# Patient Record
Sex: Female | Born: 1980 | Race: Black or African American | Hispanic: No | Smoking: Current every day smoker
Health system: Southern US, Community
[De-identification: ages and names within clinical notes are randomized; demographics above are authoritative.]

## PROBLEM LIST (undated history)

## (undated) ENCOUNTER — Inpatient Hospital Stay (HOSPITAL_COMMUNITY): Payer: Self-pay

## (undated) DIAGNOSIS — N3090 Cystitis, unspecified without hematuria: Secondary | ICD-10-CM

## (undated) DIAGNOSIS — T4145XA Adverse effect of unspecified anesthetic, initial encounter: Secondary | ICD-10-CM

## (undated) DIAGNOSIS — M810 Age-related osteoporosis without current pathological fracture: Secondary | ICD-10-CM

## (undated) DIAGNOSIS — T8859XA Other complications of anesthesia, initial encounter: Secondary | ICD-10-CM

## (undated) DIAGNOSIS — G4730 Sleep apnea, unspecified: Secondary | ICD-10-CM

## (undated) DIAGNOSIS — F419 Anxiety disorder, unspecified: Secondary | ICD-10-CM

## (undated) DIAGNOSIS — K649 Unspecified hemorrhoids: Secondary | ICD-10-CM

## (undated) DIAGNOSIS — F410 Panic disorder [episodic paroxysmal anxiety] without agoraphobia: Secondary | ICD-10-CM

## (undated) DIAGNOSIS — A549 Gonococcal infection, unspecified: Secondary | ICD-10-CM

## (undated) DIAGNOSIS — F329 Major depressive disorder, single episode, unspecified: Secondary | ICD-10-CM

## (undated) DIAGNOSIS — M1990 Unspecified osteoarthritis, unspecified site: Secondary | ICD-10-CM

## (undated) HISTORY — DX: Unspecified osteoarthritis, unspecified site: M19.90

## (undated) HISTORY — DX: Unspecified hemorrhoids: K64.9

## (undated) HISTORY — PX: BREAST SURGERY: SHX581

## (undated) HISTORY — PX: BREAST MASS EXCISION: SHX1267

## (undated) HISTORY — PX: MOUTH SURGERY: SHX715

## (undated) HISTORY — DX: Panic disorder (episodic paroxysmal anxiety): F41.0

## (undated) HISTORY — DX: Sleep apnea, unspecified: G47.30

## (undated) HISTORY — DX: Age-related osteoporosis without current pathological fracture: M81.0

---
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
## 2000-01-03 ENCOUNTER — Inpatient Hospital Stay (HOSPITAL_COMMUNITY): Admission: AD | Admit: 2000-01-03 | Discharge: 2000-01-03 | Payer: Self-pay | Admitting: *Deleted

## 2000-02-03 ENCOUNTER — Inpatient Hospital Stay (HOSPITAL_COMMUNITY): Admission: AD | Admit: 2000-02-03 | Discharge: 2000-02-07 | Payer: Self-pay | Admitting: *Deleted

## 2000-02-04 ENCOUNTER — Encounter: Payer: Self-pay | Admitting: *Deleted

## 2000-03-20 ENCOUNTER — Inpatient Hospital Stay (HOSPITAL_COMMUNITY): Admission: RE | Admit: 2000-03-20 | Discharge: 2000-03-20 | Payer: Self-pay | Admitting: *Deleted

## 2000-07-01 ENCOUNTER — Inpatient Hospital Stay (HOSPITAL_COMMUNITY): Admission: AD | Admit: 2000-07-01 | Discharge: 2000-07-01 | Payer: Self-pay | Admitting: *Deleted

## 2002-07-05 ENCOUNTER — Emergency Department (HOSPITAL_COMMUNITY): Admission: EM | Admit: 2002-07-05 | Discharge: 2002-07-05 | Payer: Self-pay | Admitting: Emergency Medicine

## 2002-07-24 ENCOUNTER — Emergency Department (HOSPITAL_COMMUNITY): Admission: EM | Admit: 2002-07-24 | Discharge: 2002-07-24 | Payer: Self-pay | Admitting: Emergency Medicine

## 2002-12-02 ENCOUNTER — Emergency Department (HOSPITAL_COMMUNITY): Admission: EM | Admit: 2002-12-02 | Discharge: 2002-12-02 | Payer: Self-pay | Admitting: Emergency Medicine

## 2002-12-04 ENCOUNTER — Encounter: Payer: Self-pay | Admitting: Emergency Medicine

## 2002-12-04 ENCOUNTER — Emergency Department (HOSPITAL_COMMUNITY): Admission: EM | Admit: 2002-12-04 | Discharge: 2002-12-04 | Payer: Self-pay | Admitting: Emergency Medicine

## 2003-02-23 ENCOUNTER — Emergency Department (HOSPITAL_COMMUNITY): Admission: EM | Admit: 2003-02-23 | Discharge: 2003-02-23 | Payer: Self-pay | Admitting: Emergency Medicine

## 2003-02-23 ENCOUNTER — Encounter: Payer: Self-pay | Admitting: Emergency Medicine

## 2003-06-03 ENCOUNTER — Emergency Department (HOSPITAL_COMMUNITY): Admission: EM | Admit: 2003-06-03 | Discharge: 2003-06-03 | Payer: Self-pay | Admitting: Emergency Medicine

## 2003-09-03 ENCOUNTER — Encounter: Admission: RE | Admit: 2003-09-03 | Discharge: 2003-09-03 | Payer: Self-pay | Admitting: Obstetrics

## 2004-03-10 ENCOUNTER — Encounter (INDEPENDENT_AMBULATORY_CARE_PROVIDER_SITE_OTHER): Payer: Self-pay | Admitting: *Deleted

## 2004-03-10 ENCOUNTER — Ambulatory Visit (HOSPITAL_BASED_OUTPATIENT_CLINIC_OR_DEPARTMENT_OTHER): Admission: RE | Admit: 2004-03-10 | Discharge: 2004-03-10 | Payer: Self-pay | Admitting: General Surgery

## 2004-03-10 ENCOUNTER — Ambulatory Visit (HOSPITAL_COMMUNITY): Admission: RE | Admit: 2004-03-10 | Discharge: 2004-03-10 | Payer: Self-pay | Admitting: General Surgery

## 2004-06-06 ENCOUNTER — Encounter: Admission: RE | Admit: 2004-06-06 | Discharge: 2004-06-06 | Payer: Self-pay | Admitting: Obstetrics

## 2004-07-26 ENCOUNTER — Emergency Department (HOSPITAL_COMMUNITY): Admission: EM | Admit: 2004-07-26 | Discharge: 2004-07-26 | Payer: Self-pay | Admitting: Emergency Medicine

## 2004-09-13 ENCOUNTER — Emergency Department (HOSPITAL_COMMUNITY): Admission: EM | Admit: 2004-09-13 | Discharge: 2004-09-13 | Payer: Self-pay | Admitting: Emergency Medicine

## 2006-04-10 ENCOUNTER — Ambulatory Visit (HOSPITAL_COMMUNITY): Admission: RE | Admit: 2006-04-10 | Discharge: 2006-04-10 | Payer: Self-pay | Admitting: Obstetrics & Gynecology

## 2006-07-03 ENCOUNTER — Ambulatory Visit: Payer: Self-pay | Admitting: *Deleted

## 2006-07-03 ENCOUNTER — Inpatient Hospital Stay (HOSPITAL_COMMUNITY): Admission: AD | Admit: 2006-07-03 | Discharge: 2006-07-03 | Payer: Self-pay | Admitting: Gynecology

## 2006-07-16 ENCOUNTER — Ambulatory Visit (HOSPITAL_COMMUNITY): Admission: RE | Admit: 2006-07-16 | Discharge: 2006-07-16 | Payer: Self-pay | Admitting: Family Medicine

## 2006-08-26 ENCOUNTER — Inpatient Hospital Stay (HOSPITAL_COMMUNITY): Admission: AD | Admit: 2006-08-26 | Discharge: 2006-08-28 | Payer: Self-pay | Admitting: Gynecology

## 2006-08-26 ENCOUNTER — Ambulatory Visit: Payer: Self-pay | Admitting: Gynecology

## 2007-07-18 ENCOUNTER — Encounter (INDEPENDENT_AMBULATORY_CARE_PROVIDER_SITE_OTHER): Payer: Self-pay | Admitting: *Deleted

## 2009-07-18 ENCOUNTER — Emergency Department (HOSPITAL_COMMUNITY): Admission: EM | Admit: 2009-07-18 | Discharge: 2009-07-18 | Payer: Self-pay | Admitting: Emergency Medicine

## 2010-05-16 ENCOUNTER — Ambulatory Visit (HOSPITAL_BASED_OUTPATIENT_CLINIC_OR_DEPARTMENT_OTHER): Admission: RE | Admit: 2010-05-16 | Discharge: 2010-05-16 | Payer: Self-pay | Admitting: Internal Medicine

## 2010-05-20 ENCOUNTER — Ambulatory Visit: Payer: Self-pay | Admitting: Internal Medicine

## 2010-07-26 ENCOUNTER — Encounter: Admission: RE | Admit: 2010-07-26 | Discharge: 2010-07-26 | Payer: Self-pay | Admitting: Internal Medicine

## 2010-11-29 LAB — CBC
HCT: 36.4 % (ref 36.0–46.0)
Hemoglobin: 12.1 g/dL (ref 12.0–15.0)
Platelets: 237 10*3/uL (ref 150–400)
RBC: 4.48 MIL/uL (ref 3.87–5.11)
RDW: 13.8 % (ref 11.5–15.5)

## 2010-11-29 LAB — DIFFERENTIAL
Eosinophils Relative: 0 % (ref 0–5)
Lymphs Abs: 0.9 10*3/uL (ref 0.7–4.0)
Monocytes Absolute: 0.7 10*3/uL (ref 0.1–1.0)
Neutro Abs: 5.5 10*3/uL (ref 1.7–7.7)
Neutrophils Relative %: 78 % — ABNORMAL HIGH (ref 43–77)

## 2010-11-29 LAB — URINALYSIS, ROUTINE W REFLEX MICROSCOPIC
Bilirubin Urine: NEGATIVE
Ketones, ur: NEGATIVE mg/dL
Nitrite: NEGATIVE
Specific Gravity, Urine: 1.015 (ref 1.005–1.030)
pH: 7 (ref 5.0–8.0)

## 2010-11-29 LAB — BASIC METABOLIC PANEL
GFR calc Af Amer: >60 mL/min (ref >60)
Sodium: 136 mEq/L (ref 135–145)

## 2010-11-29 LAB — URINE CULTURE

## 2010-11-29 LAB — URINE MICROSCOPIC-ADD ON

## 2010-11-29 LAB — POCT PREGNANCY, URINE: Preg Test, Ur: NEGATIVE

## 2011-01-12 NOTE — Op Note (Signed)
Alicia [name], Alicia [name]                         ACCOUNT NO.:  [id number]   MEDICAL RECORD NO.:  [id number]                   PATIENT TYPE:  AMB   LOCATION:  DSC                                  FACILITY:  [facility]   PHYSICIAN:  Leonie Man, M.D.                DATE OF BIRTH:  [birth year]   DATE OF PROCEDURE:  03/10/2004  DATE OF DISCHARGE:                                 OPERATIVE REPORT   PREOPERATIVE DIAGNOSIS:  Mass right breast, probable fibroadenoma.   POSTOPERATIVE DIAGNOSIS:  Fibroadenoma, right breast.   PROCEDURE:  Excisional biopsy of right breast mass.   SURGEON:  Mardene Celeste. Lurene Shadow, M.D.   ASSISTANT:  None.   ANESTHESIA:  General.   NOTE:  The patient is a 30 year old female with an enlarging mass of the  left breast, which on mammogram, appears to be a fibroadenoma.  Because of  its increasing size, the patient desires to have this removed.  She comes to  the operating room now after the risks and potential benefits of surgery  have been discussed.  All questions answered and consent obtained.   PROCEDURE:  Following the induction of satisfactory general anesthesia with  the patient was positioned supine, the right breast was prepped and draped  to be included in the sterile operative field.  The mass, which is located  at the 9 o'clock axis approximately 12 to 15 cm from the nipple areolar  complex, is palpated and the incision is carried down through the skin and  subcutaneous tissues down to the capsule of the mass.  The mass was excised  in its entirety, removed, and forwarded for pathologic evaluation.  Hemostasis was obtained with electrocautery.  Sponge, needle, and instrument  counts were verified.  The breast tissues were reapproximated with 2-0  Vicryl suture, the subcutaneous tissues were closed with 3-0 Vicryl sutures,  and the skin was closed with running 5-0 Monocryl suture reinforced with  Steri-Strips.  Sterile dressings were applied.  The anesthetic  reversed and  the patient removed from the operating room to the recovery room in stable  condition, having tolerated the procedure well.                                               Leonie Man, M.D.    PB/MEDQ  D:  03/10/2004  T:  03/10/2004  Job:  [id number]

## 2011-01-12 NOTE — Discharge Summary (Signed)
[facility]  Patient:    Alicia [name], Alicia [name]                        MRN: [id number] Adm. Date:  [id number] Disc. Date: [id number] Attending:  Deniece Ree                           Discharge Summary  DISCHARGE DIAGNOSIS:          Intrauterine pregnancy at term with prolonged rupture of membranes, failure to progress, and a nonreassuring fetal heart pattern.  DISCHARGE OPERATION:          Primary low transverse cervical cesarean section.  SUMMARY:                      The patient is an 30 year old primigravida who was admitted with ruptured membranes and in early labor.  Patient was started on IV Pitocin, during which time she progressed to 2 cm and remained at a -2 station.  Due to prolonged rupture of membranes, decreased variability, and a nonreassuring fetal heart pattern, she was scheduled for a cesarean section. Patient underwent a primary cesarean section, from which she tolerated the procedure very well without any problems.  Postoperatively, this patient did very well and was discharged on postoperative day #3.  INSTRUCTIONS:                 She was instructed on the possible complications and care following this type of surgery.  FOLLOW-UP:                    She was told to return to my office in four weeks for follow-up evaluation, or to call me prior to that time should any problems arise. DD:  03/02/00 TD:  03/04/00 Job: [id number]

## 2011-01-12 NOTE — H&P (Signed)
[facility]  Patient:    Alicia [name], Alicia [name]                        MRN: [id number] Adm. Date:  [id number] Attending:  Deniece Ree                         History and Physical  HISTORY OF PRESENT ILLNESS:   The patient is an 30 year old primigravida whose estimated date of confinement is February 16, 2000.  The patient came to triage complaining of leaking of fluids.  On evaluation at the time of coming to the triage, the patient did indeed have rupture of membranes.  However, was not in active labor.  The patient was admitted to labor and delivery for routine labor and delivery.  PAST MEDICAL HISTORY: REVIEW OF SYSTEMS:            Noncontributory.  The patient has not had any prenatal complications.  PHYSICAL EXAMINATION:  GENERAL:                      Revealed a well-developed, well-nourished, gravid  female in labor-type distress.  HEENT:                        Within normal limits.  NECK:                         Supple.  BREASTS:                      Without masses, tenderness, or discharge.  LUNGS:                        Clear to auscultation and percussion.  HEART:                        Normal sinus rhythm without murmurs, rubs, or gallops.  ABDOMEN:                      Term gravid with fetal heart beats 136 to 148 in he right lower quadrant.  EXTREMITIES: NEUROLOGICAL:    Within normal limits.  PELVIC:                       Revealed a cervix that was 2 cm dilated, an apparent vertex presentation, at -2 station.  ADMISSION DIAGNOSES:          1. Intrauterine pregnancy at term.                               2. Rupture of membranes.  PLAN:                         Labor and delivery. DD:  02/04/00 TD:  02/06/00 Job: [id number]

## 2011-01-12 NOTE — Op Note (Signed)
[facility]  Patient:    Alicia [name], Alicia [name]                        MRN: [id number] Proc. Date: 02/04/00 Adm. Date:  [id number] Attending:  Deniece Ree                           Operative Report  INDICATIONS FOR PROCEDURE:    The patient was admitted with ruptured membranes.  However, during the next 17-18 hours and with Pitocin augmentation, the patient did not change the cervix at all.  She remained at 2 cm vertex presentation, now at a -2 station.  During a large portion of this time, the patient also had minimal variability with fetal heart rate pattern. After 17-18 hours, the decision was made to proceed with cesarean section with the diagnosis of prolonged ruptured membranes, failure to progress after adequate labor and a nonreassuring fetal heart pattern.  PREOPERATIVE DIAGNOSIS:       ______  OPERATION:                    Primary low transverse cervical cesarean section.  POSTOPERATIVE DIAGNOSES:      1. ______.                               2. Viable female infant with APGARS 9 and 9.  SURGEON:                      Deniece Ree, M.D.  ANESTHESIA:                   Epidural.  ANESTHESIOLOGIST:             Ellison Hughs., M.D.  ESTIMATED BLOOD LOSS:         500 cc.  DRAIN:                        Foley left to straight drainage.  DISPOSITION:                  The patient tolerated the procedure well and was returned to the recovery room in satisfactory condition.  DESCRIPTION OF PROCEDURE:     The patient was taken to the operating room, procedure in the usual fashion for a cesarean section.  A low Pfannenstiel incision was made.  This was carried down through the fascia, at which time the fascia was excised for the length of the incision.  The midline was identified and the rectus muscles separated.  The abdominal peritoneum was then incised in a vertical fashion using Metzenbaum scissors.  The visceral peritoneum was  then incised bilaterally toward the round ligaments, following which the lower uterine segment was scored, incised in the midline and bluntly dissected out.  A right hand was introduced and, at this point, the infants head was delivered.  It was noted at that time that there were a nuchal cord present.  The nasal and oropharynx were then suctioned out with a suction bulb followed by complete delivery without any problems.  The cord was clamped and the infant handed over to the pediatricians who were in attendance.  Because of the activity of the infant at this time, no cord pH was obtained.  Cord blood was then obtained, following which the  placenta as well as all products of conception were then manually removed from the uterine cavity.  IV Pitocin as well as IV antibiotics were then begun.  The myometrium was then closed using #1 chromic in a running locking stitch, followed by an imbricating stitch, again using #1 chromic.  Reperitonealization was then carried out using 2-0 chromic and a running stitch.  Sponge and needle count was correct x 2.  Hemostasis was present.  The tubes and ovaries bilaterally appeared to be within normal limits.  The abdominal peritoneum was then closed with 2-0 chromic in a running stitch, followed by closures of the fascia using #1 Vicryl in a running stitch.  The skin was closed with skin staples.  The procedure was terminated.  The patient tolerated the procedure well and was transferred to the recovery room in satisfactory condition. DD:  02/04/00 TD:  02/06/00 Job: [id number]

## 2011-03-06 ENCOUNTER — Emergency Department (HOSPITAL_COMMUNITY)
Admission: EM | Admit: 2011-03-06 | Discharge: 2011-03-06 | Disposition: A | Discharge status: Home / Self Care | Payer: Medicaid Other | Attending: Emergency Medicine | Admitting: Emergency Medicine

## 2011-03-06 DIAGNOSIS — R42 Dizziness and giddiness: Secondary | ICD-10-CM | POA: Insufficient documentation

## 2011-03-06 DIAGNOSIS — R55 Syncope and collapse: Secondary | ICD-10-CM | POA: Insufficient documentation

## 2011-03-06 DIAGNOSIS — E860 Dehydration: Secondary | ICD-10-CM | POA: Insufficient documentation

## 2011-03-06 DIAGNOSIS — R197 Diarrhea, unspecified: Secondary | ICD-10-CM | POA: Insufficient documentation

## 2011-03-06 LAB — DIFFERENTIAL
Basophils Absolute: 0 10*3/uL (ref 0.0–0.1)
Eosinophils Absolute: 0.2 10*3/uL (ref 0.0–0.7)
Eosinophils Relative: 2 % (ref 0–5)
Lymphs Abs: 4.2 10*3/uL — ABNORMAL HIGH (ref 0.7–4.0)
Monocytes Relative: 5 % (ref 3–12)
Neutro Abs: 5.3 10*3/uL (ref 1.7–7.7)

## 2011-03-06 LAB — CBC
MCV: 83.5 fL (ref 78.0–100.0)
RBC: 4.61 MIL/uL (ref 3.87–5.11)
RDW: 13.6 % (ref 11.5–15.5)
WBC: 10.3 10*3/uL (ref 4.0–10.5)

## 2011-03-06 LAB — COMPREHENSIVE METABOLIC PANEL
ALT: 18 U/L (ref 0–35)
Albumin: 3.5 g/dL (ref 3.5–5.2)
CO2: 27 mEq/L (ref 19–32)
Chloride: 101 mEq/L (ref 96–112)
Creatinine, Ser: 0.5 mg/dL (ref 0.50–1.10)
Total Bilirubin: 0.4 mg/dL (ref 0.3–1.2)
Total Protein: 7.9 g/dL (ref 6.0–8.3)

## 2011-03-08 ENCOUNTER — Emergency Department (HOSPITAL_COMMUNITY)
Admission: EM | Admit: 2011-03-08 | Discharge: 2011-03-08 | Disposition: A | Discharge status: Home / Self Care | Payer: Medicaid Other | Attending: Emergency Medicine | Admitting: Emergency Medicine

## 2011-03-08 DIAGNOSIS — E876 Hypokalemia: Secondary | ICD-10-CM | POA: Insufficient documentation

## 2011-03-08 DIAGNOSIS — R197 Diarrhea, unspecified: Secondary | ICD-10-CM | POA: Insufficient documentation

## 2011-03-08 DIAGNOSIS — R1013 Epigastric pain: Secondary | ICD-10-CM | POA: Insufficient documentation

## 2011-03-08 DIAGNOSIS — H81399 Other peripheral vertigo, unspecified ear: Secondary | ICD-10-CM | POA: Insufficient documentation

## 2011-03-08 DIAGNOSIS — R112 Nausea with vomiting, unspecified: Secondary | ICD-10-CM | POA: Insufficient documentation

## 2011-03-08 LAB — DIFFERENTIAL
Basophils Absolute: 0 10*3/uL (ref 0.0–0.1)
Eosinophils Relative: 1 % (ref 0–5)
Lymphocytes Relative: 35 % (ref 12–46)
Lymphs Abs: 3.9 10*3/uL (ref 0.7–4.0)
Monocytes Absolute: 0.5 10*3/uL (ref 0.1–1.0)
Neutro Abs: 6.5 10*3/uL (ref 1.7–7.7)

## 2011-03-08 LAB — URINALYSIS, ROUTINE W REFLEX MICROSCOPIC
Glucose, UA: NEGATIVE mg/dL
Hgb urine dipstick: NEGATIVE
Protein, ur: NEGATIVE mg/dL
Specific Gravity, Urine: 1.024 (ref 1.005–1.030)

## 2011-03-08 LAB — CBC
HCT: 34.1 % — ABNORMAL LOW (ref 36.0–46.0)
MCHC: 32.8 g/dL (ref 30.0–36.0)
MCV: 80.4 fL (ref 78.0–100.0)
Platelets: 296 10*3/uL (ref 150–400)
RDW: 13.5 % (ref 11.5–15.5)
WBC: 11 10*3/uL — ABNORMAL HIGH (ref 4.0–10.5)

## 2011-03-08 LAB — COMPREHENSIVE METABOLIC PANEL
ALT: 18 U/L (ref 0–35)
Alkaline Phosphatase: 69 U/L (ref 39–117)
Chloride: 103 mEq/L (ref 96–112)
GFR calc Af Amer: >60 mL/min (ref >60)
GFR calc non Af Amer: >60 mL/min (ref >60)
Total Bilirubin: 0.3 mg/dL (ref 0.3–1.2)
Total Protein: 7.3 g/dL (ref 6.0–8.3)

## 2011-03-08 LAB — URINE MICROSCOPIC-ADD ON

## 2011-03-08 LAB — LIPASE, BLOOD: Lipase: 16 U/L (ref 11–59)

## 2011-03-08 LAB — POCT PREGNANCY, URINE: Preg Test, Ur: NEGATIVE

## 2011-03-08 LAB — LACTIC ACID, PLASMA: Lactic Acid, Venous: 1.8 mmol/L (ref 0.5–2.2)

## 2012-06-22 ENCOUNTER — Inpatient Hospital Stay (HOSPITAL_COMMUNITY)
Admission: AD | Admit: 2012-06-22 | Discharge: 2012-06-22 | Disposition: A | Discharge status: Home / Self Care | Payer: Medicaid Other | Source: Ambulatory Visit | Attending: Obstetrics & Gynecology | Admitting: Obstetrics & Gynecology

## 2012-06-22 ENCOUNTER — Inpatient Hospital Stay (HOSPITAL_COMMUNITY): Payer: Medicaid Other

## 2012-06-22 ENCOUNTER — Encounter (HOSPITAL_COMMUNITY): Payer: Self-pay | Admitting: *Deleted

## 2012-06-22 DIAGNOSIS — O209 Hemorrhage in early pregnancy, unspecified: Secondary | ICD-10-CM | POA: Insufficient documentation

## 2012-06-22 HISTORY — DX: Gonococcal infection, unspecified: A54.9

## 2012-06-22 HISTORY — DX: Anxiety disorder, unspecified: F41.9

## 2012-06-22 HISTORY — DX: Other complications of anesthesia, initial encounter: T88.59XA

## 2012-06-22 HISTORY — DX: Adverse effect of unspecified anesthetic, initial encounter: T41.45XA

## 2012-06-22 HISTORY — DX: Cystitis, unspecified without hematuria: N30.90

## 2012-06-22 HISTORY — DX: Major depressive disorder, single episode, unspecified: F32.9

## 2012-06-22 LAB — CBC
HCT: 37.5 % (ref 36.0–46.0)
Hemoglobin: 11.5 g/dL — ABNORMAL LOW (ref 12.0–15.0)
MCH: 24.3 pg — ABNORMAL LOW (ref 26.0–34.0)
MCHC: 30.7 g/dL (ref 30.0–36.0)
MCV: 79.3 fL (ref 78.0–100.0)
Platelets: 324 10*3/uL (ref 150–400)
RBC: 4.73 MIL/uL (ref 3.87–5.11)
RDW: 14.5 % (ref 11.5–15.5)
WBC: 7.9 10*3/uL (ref 4.0–10.5)

## 2012-06-22 LAB — URINALYSIS, ROUTINE W REFLEX MICROSCOPIC
Ketones, ur: NEGATIVE mg/dL
Leukocytes, UA: NEGATIVE
Nitrite: NEGATIVE
Protein, ur: NEGATIVE mg/dL
Urobilinogen, UA: 0.2 mg/dL (ref 0.0–1.0)
pH: 6.5 (ref 5.0–8.0)

## 2012-06-22 LAB — URINE MICROSCOPIC-ADD ON

## 2012-06-22 LAB — HCG, QUANTITATIVE, PREGNANCY: hCG, Beta Chain, Quant, S: 3812 m[IU]/mL — ABNORMAL HIGH (ref <5)

## 2012-06-22 LAB — WET PREP, GENITAL
Trich, Wet Prep: NONE SEEN
Yeast Wet Prep HPF POC: NONE SEEN

## 2012-06-22 LAB — ABO/RH: ABO/RH(D): O POS

## 2012-06-22 NOTE — MAU Note (Signed)
Pt reports having some spotting on and off all weak but now bleeding a little heavier like the start of her period. Denies pain or cramping.

## 2012-06-22 NOTE — MAU Provider Note (Signed)
History     CSN: [id number]  Arrival date & time 06/22/12  1313   None     Chief Complaint  Patient presents with  . Vaginal Bleeding    (Consider location/radiation/quality/duration/timing/severity/associated sxs/prior treatment) HPI Alicia [name] is a 31 y.o. [id number] at [redacted]w[redacted]d. She presents with c/o increased bleeding today. She has had pink spotting off/on all week, today is darker and increased amount. Still small amt, no clots, pain or cramping. No recent change in discharge, no UTI S&S or GI changes.  Past Medical History  Diagnosis Date  . Anxiety   . Depression     No med. currently, Stopped with + UPT  . Bladder infection   . Gonorrhea   . Complication of anesthesia     Epidural did not work w/2nd preg. labor    Past Surgical History  Procedure Date  . Cesarean section     X 1 1st preg., then VBAC  . Breast mass excision     Benign    Family History  Problem Relation Age of Onset  . Other Neg Hx     History  Substance Use Topics  . Smoking status: Former Smoker    Quit date: 06/22/2005  . Smokeless tobacco: Never Used  . Alcohol Use: 2.4 oz/week    4 Glasses of wine per week     Every other month    OB History    Grav Para Term Preterm Abortions TAB SAB Ect Mult Living   3 2 2       2       Review of Systems  Constitutional: Positive for fatigue. Negative for fever and chills.  Gastrointestinal: Negative for abdominal pain, diarrhea and constipation.  Genitourinary: Positive for frequency and vaginal bleeding. Negative for dysuria, urgency and vaginal discharge.    Allergies  Review of patient's allergies indicates no known allergies.  Home Medications  No current outpatient prescriptions on file.  BP 117/47  Pulse 89  Temp 98.4 F (36.9 C) (Oral)  Resp 18  Ht 5\' 2"  (1.575 m)  Wt 266 lb 3.2 oz (120.748 kg)  BMI 48.69 kg/m2  LMP 05/05/2012  Physical Exam  Constitutional: She is oriented to person, place, and time. She  appears well-developed and well-nourished.  Abdominal: Soft. There is no tenderness.  Genitourinary:       Pelvic: Vulva- nl anatomy,skin intact Vagina- scant dark blood Cx- parous Uterus-? Enlarged, non tender Adn-non tender, no masses palp  Musculoskeletal: Normal range of motion.  Neurological: She is alert and oriented to person, place, and time.  Skin: Skin is warm and dry.  Psychiatric: She has a normal mood and affect. Her behavior is normal.    ED Course  Procedures (including critical care time)  Labs Reviewed  URINALYSIS, ROUTINE W REFLEX MICROSCOPIC - Abnormal; Notable for the following:    APPearance HAZY (*)     Hgb urine dipstick LARGE (*)     All other components within normal limits  POCT PREGNANCY, URINE - Abnormal; Notable for the following:    Preg Test, Ur POSITIVE (*)     All other components within normal limits  URINE MICROSCOPIC-ADD ON - Abnormal; Notable for the following:    Squamous Epithelial / LPF MANY (*)     Bacteria, UA FEW (*)     All other components within normal limits  ABO/RH  CBC  HCG, QUANTITATIVE, PREGNANCY  WET PREP, GENITAL  GC/CHLAMYDIA PROBE AMP, GENITAL   No results found.  Results for orders placed during the hospital encounter of 06/22/12 (from the past 24 hour(s))  URINALYSIS, ROUTINE W REFLEX MICROSCOPIC     Status: Abnormal   Collection Time   06/22/12  1:33 PM      Component Value Range   Color, Urine YELLOW  YELLOW   APPearance HAZY (*) CLEAR   Specific Gravity, Urine 1.025  1.005 - 1.030   pH 6.5  5.0 - 8.0   Glucose, UA NEGATIVE  NEGATIVE mg/dL   Hgb urine dipstick LARGE (*) NEGATIVE   Bilirubin Urine NEGATIVE  NEGATIVE   Ketones, ur NEGATIVE  NEGATIVE mg/dL   Protein, ur NEGATIVE  NEGATIVE mg/dL   Urobilinogen, UA 0.2  0.0 - 1.0 mg/dL   Nitrite NEGATIVE  NEGATIVE   Leukocytes, UA NEGATIVE  NEGATIVE  URINE MICROSCOPIC-ADD ON     Status: Abnormal   Collection Time   06/22/12  1:33 PM      Component Value  Range   Squamous Epithelial / LPF MANY (*) RARE   WBC, UA 0-2  <3 WBC/hpf   RBC / HPF 3-6  <3 RBC/hpf   Bacteria, UA FEW (*) RARE   Urine-Other MUCOUS PRESENT    POCT PREGNANCY, URINE     Status: Abnormal   Collection Time   06/22/12  1:45 PM      Component Value Range   Preg Test, Ur POSITIVE (*) NEGATIVE  WET PREP, GENITAL     Status: Abnormal   Collection Time   06/22/12  2:10 PM      Component Value Range   Yeast Wet Prep HPF POC NONE SEEN  NONE SEEN   Trich, Wet Prep NONE SEEN  NONE SEEN   Clue Cells Wet Prep HPF POC MODERATE (*) NONE SEEN   WBC, Wet Prep HPF POC FEW (*) NONE SEEN  ABO/RH     Status: Normal   Collection Time   06/22/12  2:40 PM      Component Value Range   ABO/RH(D) O POS    CBC     Status: Abnormal   Collection Time   06/22/12  2:40 PM      Component Value Range   WBC 7.9  4.0 - 10.5 K/uL   RBC 4.73  3.87 - 5.11 MIL/uL   Hemoglobin 11.5 (*) 12.0 - 15.0 g/dL   HCT 40.9  81.1 - 91.4 %   MCV 79.3  78.0 - 100.0 fL   MCH 24.3 (*) 26.0 - 34.0 pg   MCHC 30.7  30.0 - 36.0 g/dL   RDW 78.2  95.6 - 21.3 %   Platelets 324  150 - 400 K/uL  HCG, QUANTITATIVE, PREGNANCY     Status: Abnormal   Collection Time   06/22/12  2:40 PM      Component Value Range   hCG, Beta Chain, Quant, S 3812 (*) <5 mIU/mL   US Ob Comp Less 14 Wks  06/22/2012  *RADIOLOGY REPORT*  Clinical Data: 31 year old pregnant female with vaginal bleeding. Estimated gestation of 6 weeks 6 days by LMP.  OBSTETRIC <14 WK [place] AND TRANSVAGINAL OB US  Technique:  Both transabdominal and transvaginal ultrasound examinations were performed for complete evaluation of the gestation as well as the maternal uterus, adnexal regions, and pelvic cul-de-sac.  Transvaginal technique was performed to assess early pregnancy.  Comparison:  None  Intrauterine gestational sac:  There are two small cystic structures within the mid uterus, one which appears more simple and measures 6 x  8 x 9 mm.  The second cystic  structure is more complex and measures 6 x 7 x 9 mm.  I suspect the simple appearing cystic structure represents a small intrauterine gestational sac is there appears to be some decidual reaction.  The other mildly complex cystic structure could represent a small amount of blood/debris in the endometrial canal or possibly even a second gestational sac. Yolk sac: Not visualized Embryo: Not visualized  MSD: 7.4 mm  5 w 2 d    [place] EDC: 02/20/2013  Maternal uterus/adnexae: There is no evidence of subchorionic hemorrhage. The ovaries bilaterally are unremarkable. There is no evidence of free fluid or adnexal mass.  IMPRESSION: Two small cystic structures within the uterus as described.  One or both could represent an early intrauterine gestational sac, but no evidence of yolk sac or embryo. Clinical/laboratory and 1-2 week ultrasound follow-up is recommended.  No evidence of adnexal mass or free fluid.   Original Report Authenticated By: Rosendo Gros, M.D.    US Ob Transvaginal  06/22/2012  *RADIOLOGY REPORT*  Clinical Data: 31 year old pregnant female with vaginal bleeding. Estimated gestation of 6 weeks 6 days by LMP.  OBSTETRIC <14 WK [place] AND TRANSVAGINAL OB US  Technique:  Both transabdominal and transvaginal ultrasound examinations were performed for complete evaluation of the gestation as well as the maternal uterus, adnexal regions, and pelvic cul-de-sac.  Transvaginal technique was performed to assess early pregnancy.  Comparison:  None  Intrauterine gestational sac:  There are two small cystic structures within the mid uterus, one which appears more simple and measures 6 x 8 x 9 mm.  The second cystic structure is more complex and measures 6 x 7 x 9 mm.  I suspect the simple appearing cystic structure represents a small intrauterine gestational sac is there appears to be some decidual reaction.  The other mildly complex cystic structure could represent a small amount of blood/debris in the endometrial canal  or possibly even a second gestational sac. Yolk sac: Not visualized Embryo: Not visualized  MSD: 7.4 mm  5 w 2 d    [place] EDC: 02/20/2013  Maternal uterus/adnexae: There is no evidence of subchorionic hemorrhage. The ovaries bilaterally are unremarkable. There is no evidence of free fluid or adnexal mass.  IMPRESSION: Two small cystic structures within the uterus as described.  One or both could represent an early intrauterine gestational sac, but no evidence of yolk sac or embryo. Clinical/laboratory and 1-2 week ultrasound follow-up is recommended.  No evidence of adnexal mass or free fluid.   Original Report Authenticated By: Rosendo Gros, M.D.      No diagnosis found.    MDM  ASSESSMENT:  Very early pregnancy, ? twin gestation,? Viability O+ Clue cells on wet prep, no symptoms, pt declines to terat at this time   PLAN:  Return 48 hr repeat BHCG Precautions reviewed

## 2012-06-22 NOTE — MAU Provider Note (Signed)
Attestation of Attending Supervision of Advanced Practitioner (CNM/NP): Evaluation and management procedures were performed by the Advanced Practitioner under my supervision and collaboration.  I have reviewed the Advanced Practitioner's note and chart, and I agree with the management and plan.  Dajanae Brophy, MD, FACOG Attending Obstetrician & Gynecologist Faculty Practice, [facility]  

## 2012-06-23 LAB — GC/CHLAMYDIA PROBE AMP, GENITAL
Chlamydia, DNA Probe: NEGATIVE
GC Probe Amp, Genital: NEGATIVE

## 2012-06-27 ENCOUNTER — Other Ambulatory Visit: Payer: Self-pay | Admitting: Obstetrics

## 2012-06-27 ENCOUNTER — Ambulatory Visit (HOSPITAL_COMMUNITY): Payer: Medicaid Other

## 2012-06-27 DIAGNOSIS — O3680X0 Pregnancy with inconclusive fetal viability, not applicable or unspecified: Secondary | ICD-10-CM

## 2012-06-27 DIAGNOSIS — M1990 Unspecified osteoarthritis, unspecified site: Secondary | ICD-10-CM

## 2012-06-27 DIAGNOSIS — F32A Depression, unspecified: Secondary | ICD-10-CM

## 2012-06-27 DIAGNOSIS — K649 Unspecified hemorrhoids: Secondary | ICD-10-CM

## 2012-06-27 DIAGNOSIS — F410 Panic disorder [episodic paroxysmal anxiety] without agoraphobia: Secondary | ICD-10-CM

## 2012-06-27 HISTORY — DX: Unspecified osteoarthritis, unspecified site: M19.90

## 2012-06-27 HISTORY — DX: Unspecified hemorrhoids: K64.9

## 2012-06-27 HISTORY — DX: Depression, unspecified: F32.A

## 2012-06-27 HISTORY — DX: Panic disorder (episodic paroxysmal anxiety): F41.0

## 2012-06-27 LAB — SICKLE CELL SCREEN: Sickle Cell Screen: NEGATIVE

## 2012-07-01 ENCOUNTER — Ambulatory Visit (HOSPITAL_COMMUNITY): Payer: Medicaid Other

## 2012-07-02 ENCOUNTER — Ambulatory Visit (HOSPITAL_COMMUNITY)
Admission: RE | Admit: 2012-07-02 | Discharge: 2012-07-02 | Disposition: A | Discharge status: Home / Self Care | Payer: Medicaid Other | Source: Ambulatory Visit | Attending: Obstetrics | Admitting: Obstetrics

## 2012-07-02 DIAGNOSIS — O3680X0 Pregnancy with inconclusive fetal viability, not applicable or unspecified: Secondary | ICD-10-CM

## 2012-07-02 DIAGNOSIS — Z3689 Encounter for other specified antenatal screening: Secondary | ICD-10-CM | POA: Insufficient documentation

## 2012-08-28 ENCOUNTER — Encounter (HOSPITAL_COMMUNITY): Payer: Self-pay | Admitting: Emergency Medicine

## 2012-08-28 ENCOUNTER — Emergency Department (HOSPITAL_COMMUNITY)
Admission: EM | Admit: 2012-08-28 | Discharge: 2012-08-28 | Disposition: A | Discharge status: Home / Self Care | Payer: Medicaid Other | Attending: Emergency Medicine | Admitting: Emergency Medicine

## 2012-08-28 DIAGNOSIS — Z8659 Personal history of other mental and behavioral disorders: Secondary | ICD-10-CM | POA: Insufficient documentation

## 2012-08-28 DIAGNOSIS — F411 Generalized anxiety disorder: Secondary | ICD-10-CM | POA: Insufficient documentation

## 2012-08-28 DIAGNOSIS — Z79899 Other long term (current) drug therapy: Secondary | ICD-10-CM | POA: Insufficient documentation

## 2012-08-28 DIAGNOSIS — Z3490 Encounter for supervision of normal pregnancy, unspecified, unspecified trimester: Secondary | ICD-10-CM

## 2012-08-28 DIAGNOSIS — Z87891 Personal history of nicotine dependence: Secondary | ICD-10-CM | POA: Insufficient documentation

## 2012-08-28 DIAGNOSIS — Z87440 Personal history of urinary (tract) infections: Secondary | ICD-10-CM | POA: Insufficient documentation

## 2012-08-28 DIAGNOSIS — Z8619 Personal history of other infectious and parasitic diseases: Secondary | ICD-10-CM | POA: Insufficient documentation

## 2012-08-28 DIAGNOSIS — O9989 Other specified diseases and conditions complicating pregnancy, childbirth and the puerperium: Secondary | ICD-10-CM | POA: Insufficient documentation

## 2012-08-28 DIAGNOSIS — K529 Noninfective gastroenteritis and colitis, unspecified: Secondary | ICD-10-CM

## 2012-08-28 DIAGNOSIS — K5289 Other specified noninfective gastroenteritis and colitis: Secondary | ICD-10-CM | POA: Insufficient documentation

## 2012-08-28 LAB — POCT I-STAT, CHEM 8
Calcium, Ion: 1.15 mmol/L (ref 1.12–1.23)
Creatinine, Ser: 0.6 mg/dL (ref 0.50–1.10)
Glucose, Bld: 126 mg/dL — ABNORMAL HIGH (ref 70–99)
HCT: 46 % (ref 36.0–46.0)
Hemoglobin: 15.6 g/dL — ABNORMAL HIGH (ref 12.0–15.0)

## 2012-08-28 MED ORDER — ONDANSETRON 4 MG PO TBDP: 4.0000 mg | ORAL_TABLET | Freq: Three times a day (TID) | ORAL | Status: DC | PRN

## 2012-08-28 MED ORDER — SODIUM CHLORIDE 0.9 % IV BOLUS (SEPSIS)
1000.0000 mL | Freq: Once | INTRAVENOUS | Status: AC
  Administered 2012-08-28: 1000 mL via INTRAVENOUS

## 2012-08-28 MED ORDER — ONDANSETRON HCL 4 MG/2ML IJ SOLN
4.0000 mg | Freq: Once | INTRAMUSCULAR | Status: AC
  Administered 2012-08-28: 4 mg via INTRAVENOUS
  Filled 2012-08-28: qty 2

## 2012-08-28 NOTE — ED Notes (Signed)
Per patient, she is [redacted] wks pregnant.   Patient has been receiving prenatal care.   Patient woke up this morning with N/V/D.   Patient claims she didn't want to get dehydrated so called EMS.

## 2012-08-28 NOTE — ED Notes (Signed)
Provided ginger ale to the patient for fluid challenge.

## 2012-08-28 NOTE — ED Provider Notes (Signed)
History     CSN: [id number]  Arrival date & time 08/28/12  0820   First MD Initiated Contact with Patient 08/28/12 [id number]      Chief Complaint  Patient presents with  . Emesis  . Nausea  . Diarrhea     HPI Per patient, she is [redacted] wks pregnant. Patient has been receiving prenatal care. Patient woke up this morning with N/V/D. Patient claims she didn't want to get dehydrated so called EMS.  Patient denies hematochezia or hematemesis.  Denies fever.  Denies significant cough.  Past Medical History  Diagnosis Date  . Anxiety   . Depression     No med. currently, Stopped with + UPT  . Bladder infection   . Gonorrhea   . Complication of anesthesia     Epidural did not work w/2nd preg. labor    Past Surgical History  Procedure Date  . Cesarean section     X 1 1st preg., then VBAC  . Breast mass excision     Benign    Family History  Problem Relation Age of Onset  . Other Neg Hx     History  Substance Use Topics  . Smoking status: Former Smoker    Quit date: 06/22/2005  . Smokeless tobacco: Never Used  . Alcohol Use: No     Comment: Every other month    OB History    Grav Para Term Preterm Abortions TAB SAB Ect Mult Living   3 2 2       2       Review of Systems All other systems reviewed and are negative Allergies  Review of patient's allergies indicates no known allergies.  Home Medications   Current Outpatient Rx  Name  Route  Sig  Dispense  Refill  . CLONAZEPAM 1 MG PO TABS   Oral   Take 1 mg by mouth 2 (two) times daily as needed. anxiety         . ROBITUSSIN CF PO   Oral   Take 10 mLs by mouth every 4 (four) hours as needed. For cough         . ONDANSETRON 4 MG PO TBDP   Oral   Take 1 tablet (4 mg total) by mouth every 8 (eight) hours as needed for nausea.   20 tablet   0     BP 116/55  Pulse 96  Temp 98.8 F (37.1 C) (Oral)  Resp 20  Ht 5\' 2"  (1.575 m)  Wt 266 lb (120.657 kg)  BMI 48.65 kg/m2  SpO2 99%  LMP  05/05/2012  Physical Exam  Nursing note and vitals reviewed. Constitutional: She is oriented to person, place, and time. She appears well-developed and well-nourished. No distress.  HENT:  Head: Normocephalic and atraumatic.  Eyes: Pupils are equal, round, and reactive to light.  Neck: Normal range of motion.  Cardiovascular: Intact distal pulses.   Pulmonary/Chest: No respiratory distress. She has no wheezes.  Abdominal: Normal appearance. She exhibits no distension. There is no tenderness. There is no rebound.  Musculoskeletal: Normal range of motion.  Neurological: She is alert and oriented to person, place, and time. No cranial nerve deficit.  Skin: Skin is warm and dry. No rash noted.  Psychiatric: She has a normal mood and affect. Her behavior is normal.    ED Course  Procedures (including critical care time)  Medications  Pseudoephedrine-DM-GG (ROBITUSSIN CF PO) (not administered)  ondansetron (ZOFRAN ODT) 4 MG disintegrating tablet (not administered)  sodium  chloride 0.9 % bolus 1,000 mL (0 mL Intravenous Stopped 08/28/12 1232)  ondansetron (ZOFRAN) injection 4 mg (4 mg Intravenous Given 08/28/12 1016)    Labs Reviewed  POCT I-STAT, CHEM 8 - Abnormal; Notable for the following:    BUN 5 (*)     Glucose, Bld 126 (*)     Hemoglobin 15.6 (*)     All other components within normal limits  LAB REPORT - SCANNED   No results found.   1. Gastroenteritis   2. Intrauterine pregnancy       MDM  After treatment and PO challenge in the ED the patient feels back to baseline and wants to go home.         Nelia Shi, MD 08/31/12 1343

## 2012-10-30 ENCOUNTER — Other Ambulatory Visit: Payer: Self-pay | Admitting: Obstetrics

## 2012-10-30 DIAGNOSIS — Z369 Encounter for antenatal screening, unspecified: Secondary | ICD-10-CM

## 2012-11-04 ENCOUNTER — Encounter: Payer: Self-pay | Admitting: Obstetrics

## 2012-11-09 ENCOUNTER — Encounter: Payer: Self-pay | Admitting: *Deleted

## 2012-11-12 ENCOUNTER — Encounter: Payer: Self-pay | Admitting: Obstetrics

## 2012-11-12 ENCOUNTER — Ambulatory Visit (INDEPENDENT_AMBULATORY_CARE_PROVIDER_SITE_OTHER): Payer: Medicaid Other

## 2012-11-12 ENCOUNTER — Ambulatory Visit (INDEPENDENT_AMBULATORY_CARE_PROVIDER_SITE_OTHER): Payer: Medicaid Other | Admitting: Obstetrics

## 2012-11-12 ENCOUNTER — Ambulatory Visit (INDEPENDENT_AMBULATORY_CARE_PROVIDER_SITE_OTHER): Payer: Medicaid Other | Admitting: *Deleted

## 2012-11-12 VITALS — BP 105/69 | Temp 98.6°F | Wt 260.0 lb

## 2012-11-12 DIAGNOSIS — Z3480 Encounter for supervision of other normal pregnancy, unspecified trimester: Secondary | ICD-10-CM

## 2012-11-12 DIAGNOSIS — Z3482 Encounter for supervision of other normal pregnancy, second trimester: Secondary | ICD-10-CM

## 2012-11-12 DIAGNOSIS — Z369 Encounter for antenatal screening, unspecified: Secondary | ICD-10-CM

## 2012-11-12 LAB — POCT URINALYSIS DIPSTICK
Blood, UA: NEGATIVE
Ketones, UA: NEGATIVE
Spec Grav, UA: 1.02

## 2012-11-12 LAB — US OB DETAIL + 14 WK

## 2012-11-12 NOTE — Progress Notes (Signed)
Pt states she is having abdominal tightening twice a day for the 2 days.

## 2012-11-13 LAB — CBC
HCT: 36.6 % (ref 36.0–46.0)
Hemoglobin: 11.7 g/dL — ABNORMAL LOW (ref 12.0–15.0)
MCHC: 32 g/dL (ref 30.0–36.0)
RDW: 14.9 % (ref 11.5–15.5)
WBC: 9.7 10*3/uL (ref 4.0–10.5)

## 2012-11-13 LAB — GLUCOSE TOLERANCE, 2 HOURS W/ 1HR
Glucose, 1 hour: 195 mg/dL — ABNORMAL HIGH (ref 70–170)
Glucose, Fasting: 107 mg/dL — ABNORMAL HIGH (ref 70–99)

## 2012-11-13 LAB — RPR

## 2012-11-13 LAB — HIV ANTIBODY (ROUTINE TESTING W REFLEX): HIV: NONREACTIVE

## 2012-11-14 ENCOUNTER — Encounter: Payer: Self-pay | Admitting: Obstetrics

## 2012-11-19 ENCOUNTER — Encounter: Payer: Self-pay | Admitting: Obstetrics

## 2012-11-26 ENCOUNTER — Encounter: Payer: Medicaid Other | Admitting: Obstetrics

## 2013-01-22 ENCOUNTER — Encounter: Payer: Self-pay | Admitting: Obstetrics

## 2013-01-22 ENCOUNTER — Ambulatory Visit (INDEPENDENT_AMBULATORY_CARE_PROVIDER_SITE_OTHER): Payer: Medicaid Other | Admitting: Obstetrics

## 2013-01-22 VITALS — BP 117/80 | Temp 98.2°F | Wt 269.0 lb

## 2013-01-22 DIAGNOSIS — Z3483 Encounter for supervision of other normal pregnancy, third trimester: Secondary | ICD-10-CM

## 2013-01-22 DIAGNOSIS — Z3480 Encounter for supervision of other normal pregnancy, unspecified trimester: Secondary | ICD-10-CM

## 2013-01-22 DIAGNOSIS — O3660X0 Maternal care for excessive fetal growth, unspecified trimester, not applicable or unspecified: Secondary | ICD-10-CM

## 2013-01-22 DIAGNOSIS — O3663X1 Maternal care for excessive fetal growth, third trimester, fetus 1: Secondary | ICD-10-CM

## 2013-01-22 DIAGNOSIS — K219 Gastro-esophageal reflux disease without esophagitis: Secondary | ICD-10-CM | POA: Insufficient documentation

## 2013-01-22 LAB — POCT URINALYSIS DIPSTICK
Bilirubin, UA: NEGATIVE
Nitrite, UA: NEGATIVE

## 2013-01-22 MED ORDER — OMEPRAZOLE 20 MG PO CPDR: 20.0000 mg | DELAYED_RELEASE_CAPSULE | Freq: Every day | ORAL | Status: DC

## 2013-01-22 NOTE — Progress Notes (Signed)
Pulse- 93 Pt states she is having pressure in lower abdomen. Pt states she is having braxton hicks contractions.

## 2013-01-23 ENCOUNTER — Encounter: Payer: Self-pay | Admitting: Obstetrics

## 2013-01-24 LAB — STREP B DNA PROBE: GBSP: NEGATIVE

## 2013-01-27 ENCOUNTER — Ambulatory Visit (HOSPITAL_COMMUNITY): Payer: Medicaid Other

## 2013-01-27 ENCOUNTER — Encounter (HOSPITAL_COMMUNITY)
Admission: AD | Disposition: A | Discharge status: Home / Self Care | Payer: Self-pay | Source: Ambulatory Visit | Attending: Obstetrics & Gynecology

## 2013-01-27 ENCOUNTER — Encounter (HOSPITAL_COMMUNITY): Payer: Self-pay | Admitting: Anesthesiology

## 2013-01-27 ENCOUNTER — Inpatient Hospital Stay (HOSPITAL_COMMUNITY): Payer: Medicaid Other

## 2013-01-27 ENCOUNTER — Inpatient Hospital Stay (HOSPITAL_COMMUNITY): Payer: Medicaid Other | Admitting: Anesthesiology

## 2013-01-27 ENCOUNTER — Encounter: Payer: Medicaid Other | Admitting: Obstetrics

## 2013-01-27 ENCOUNTER — Encounter (HOSPITAL_COMMUNITY): Payer: Self-pay | Admitting: *Deleted

## 2013-01-27 ENCOUNTER — Inpatient Hospital Stay (HOSPITAL_COMMUNITY)
Admission: AD | Admit: 2013-01-27 | Discharge: 2013-01-30 | DRG: 766 | Disposition: A | Discharge status: Home / Self Care | Payer: Medicaid Other | Source: Ambulatory Visit | Attending: Obstetrics & Gynecology | Admitting: Obstetrics & Gynecology

## 2013-01-27 DIAGNOSIS — O34219 Maternal care for unspecified type scar from previous cesarean delivery: Secondary | ICD-10-CM | POA: Diagnosis present

## 2013-01-27 DIAGNOSIS — Z98891 History of uterine scar from previous surgery: Secondary | ICD-10-CM

## 2013-01-27 LAB — CBC
Hemoglobin: 11.7 g/dL — ABNORMAL LOW (ref 12.0–15.0)
MCH: 23.3 pg — ABNORMAL LOW (ref 26.0–34.0)
MCHC: 31.2 g/dL (ref 30.0–36.0)
MCV: 74.6 fL — ABNORMAL LOW (ref 78.0–100.0)
RBC: 5.03 MIL/uL (ref 3.87–5.11)
RDW: 16.5 % — ABNORMAL HIGH (ref 11.5–15.5)

## 2013-01-27 LAB — RPR: RPR Ser Ql: NONREACTIVE

## 2013-01-27 LAB — TYPE AND SCREEN
ABO/RH(D): O POS
Antibody Screen: NEGATIVE

## 2013-01-27 SURGERY — Surgical Case
Anesthesia: Epidural | Site: Abdomen | Wound class: Clean Contaminated

## 2013-01-27 MED ORDER — SCOPOLAMINE 1 MG/3DAYS TD PT72
MEDICATED_PATCH | TRANSDERMAL | Status: AC
  Administered 2013-01-27: 1.5 mg via TRANSDERMAL
  Filled 2013-01-27: qty 1

## 2013-01-27 MED ORDER — PHENYLEPHRINE HCL 10 MG/ML IJ SOLN
INTRAMUSCULAR | Status: DC | PRN
  Administered 2013-01-27: 40 ug via INTRAVENOUS
  Administered 2013-01-27: 80 ug via INTRAVENOUS
  Administered 2013-01-27 (×2): 40 ug via INTRAVENOUS
  Administered 2013-01-27 (×2): 80 ug via INTRAVENOUS
  Administered 2013-01-27 (×3): 40 ug via INTRAVENOUS

## 2013-01-27 MED ORDER — METOCLOPRAMIDE HCL 5 MG/ML IJ SOLN
INTRAMUSCULAR | Status: AC
  Filled 2013-01-27: qty 2

## 2013-01-27 MED ORDER — OXYTOCIN 10 UNIT/ML IJ SOLN
40.0000 [IU] | INTRAVENOUS | Status: DC | PRN
  Administered 2013-01-27: 40 [IU] via INTRAVENOUS

## 2013-01-27 MED ORDER — METOCLOPRAMIDE HCL 5 MG/ML IJ SOLN: 10.0000 mg | Freq: Three times a day (TID) | INTRAMUSCULAR | Status: DC | PRN

## 2013-01-27 MED ORDER — MORPHINE SULFATE (PF) 0.5 MG/ML IJ SOLN
INTRAMUSCULAR | Status: DC | PRN
  Administered 2013-01-27: 4 mg via EPIDURAL
  Administered 2013-01-27: 1 mg via INTRAVENOUS

## 2013-01-27 MED ORDER — PHENYLEPHRINE 40 MCG/ML (10ML) SYRINGE FOR IV PUSH (FOR BLOOD PRESSURE SUPPORT)
80.0000 ug | PREFILLED_SYRINGE | INTRAVENOUS | Status: DC | PRN
  Filled 2013-01-27: qty 2

## 2013-01-27 MED ORDER — ONDANSETRON HCL 4 MG PO TABS: 4.0000 mg | ORAL_TABLET | ORAL | Status: DC | PRN

## 2013-01-27 MED ORDER — MEPERIDINE HCL 25 MG/ML IJ SOLN: 6.2500 mg | INTRAMUSCULAR | Status: DC | PRN

## 2013-01-27 MED ORDER — ZOLPIDEM TARTRATE 5 MG PO TABS: 5.0000 mg | ORAL_TABLET | Freq: Every evening | ORAL | Status: DC | PRN

## 2013-01-27 MED ORDER — TERBUTALINE SULFATE 1 MG/ML IJ SOLN
INTRAMUSCULAR | Status: AC
  Administered 2013-01-27: 0.25 mg
  Filled 2013-01-27: qty 1

## 2013-01-27 MED ORDER — DIPHENHYDRAMINE HCL 50 MG/ML IJ SOLN: 12.5000 mg | INTRAMUSCULAR | Status: DC | PRN

## 2013-01-27 MED ORDER — LACTATED RINGERS IV SOLN
INTRAVENOUS | Status: DC | PRN
  Administered 2013-01-27 (×3): via INTRAVENOUS

## 2013-01-27 MED ORDER — MIDAZOLAM HCL 2 MG/2ML IJ SOLN: 0.5000 mg | Freq: Once | INTRAMUSCULAR | Status: DC | PRN

## 2013-01-27 MED ORDER — LACTATED RINGERS IV SOLN
INTRAVENOUS | Status: DC
  Administered 2013-01-27: 300 mL via INTRAUTERINE
  Administered 2013-01-27: 16:00:00 via INTRAUTERINE

## 2013-01-27 MED ORDER — DIBUCAINE 1 % RE OINT: 1.0000 "application " | TOPICAL_OINTMENT | RECTAL | Status: DC | PRN

## 2013-01-27 MED ORDER — SODIUM CHLORIDE 0.9 % IJ SOLN: 3.0000 mL | INTRAMUSCULAR | Status: DC | PRN

## 2013-01-27 MED ORDER — LIDOCAINE HCL (PF) 1 % IJ SOLN
INTRAMUSCULAR | Status: DC | PRN
  Administered 2013-01-27 (×2): 5 mL

## 2013-01-27 MED ORDER — OXYTOCIN 40 UNITS IN LACTATED RINGERS INFUSION - SIMPLE MED: 62.5000 mL/h | INTRAVENOUS | Status: DC

## 2013-01-27 MED ORDER — LACTATED RINGERS IV SOLN
INTRAVENOUS | Status: DC
  Administered 2013-01-28: 05:00:00 via INTRAVENOUS

## 2013-01-27 MED ORDER — LACTATED RINGERS IV SOLN
500.0000 mL | INTRAVENOUS | Status: DC | PRN
Start: 2013-01-27 — End: 2013-01-27
  Administered 2013-01-27 (×2): 1000 mL via INTRAVENOUS

## 2013-01-27 MED ORDER — LACTATED RINGERS IV SOLN: 500.0000 mL | Freq: Once | INTRAVENOUS | Status: DC

## 2013-01-27 MED ORDER — DIPHENHYDRAMINE HCL 25 MG PO CAPS
25.0000 mg | ORAL_CAPSULE | ORAL | Status: DC | PRN
  Administered 2013-01-27 – 2013-01-28 (×4): 25 mg via ORAL
  Filled 2013-01-27 (×3): qty 1

## 2013-01-27 MED ORDER — FENTANYL CITRATE 0.05 MG/ML IJ SOLN
INTRAMUSCULAR | Status: AC
  Administered 2013-01-27: 50 ug via INTRAVENOUS
  Filled 2013-01-27: qty 2

## 2013-01-27 MED ORDER — IBUPROFEN 600 MG PO TABS: 600.0000 mg | ORAL_TABLET | Freq: Four times a day (QID) | ORAL | Status: DC | PRN

## 2013-01-27 MED ORDER — LIDOCAINE-EPINEPHRINE (PF) 2 %-1:200000 IJ SOLN
INTRAMUSCULAR | Status: DC | PRN
  Administered 2013-01-27: 10 mL via EPIDURAL

## 2013-01-27 MED ORDER — PRENATAL MULTIVITAMIN CH
1.0000 | ORAL_TABLET | Freq: Every day | ORAL | Status: DC
  Administered 2013-01-28 – 2013-01-30 (×3): 1 via ORAL
  Filled 2013-01-27 (×3): qty 1

## 2013-01-27 MED ORDER — MORPHINE SULFATE 0.5 MG/ML IJ SOLN
INTRAMUSCULAR | Status: AC
  Filled 2013-01-27: qty 10

## 2013-01-27 MED ORDER — TETANUS-DIPHTH-ACELL PERTUSSIS 5-2.5-18.5 LF-MCG/0.5 IM SUSP
0.5000 mL | Freq: Once | INTRAMUSCULAR | Status: AC
  Administered 2013-01-28: 0.5 mL via INTRAMUSCULAR
  Filled 2013-01-27: qty 0.5

## 2013-01-27 MED ORDER — WITCH HAZEL-GLYCERIN EX PADS: 1.0000 "application " | MEDICATED_PAD | CUTANEOUS | Status: DC | PRN

## 2013-01-27 MED ORDER — OXYTOCIN BOLUS FROM INFUSION: 500.0000 mL | INTRAVENOUS | Status: DC

## 2013-01-27 MED ORDER — IBUPROFEN 600 MG PO TABS
600.0000 mg | ORAL_TABLET | Freq: Four times a day (QID) | ORAL | Status: DC
  Administered 2013-01-28 – 2013-01-30 (×9): 600 mg via ORAL
  Filled 2013-01-27 (×9): qty 1

## 2013-01-27 MED ORDER — ONDANSETRON HCL 4 MG/2ML IJ SOLN
INTRAMUSCULAR | Status: AC
  Filled 2013-01-27: qty 2

## 2013-01-27 MED ORDER — ONDANSETRON HCL 4 MG/2ML IJ SOLN: 4.0000 mg | Freq: Three times a day (TID) | INTRAMUSCULAR | Status: DC | PRN

## 2013-01-27 MED ORDER — DIPHENHYDRAMINE HCL 50 MG/ML IJ SOLN: 25.0000 mg | INTRAMUSCULAR | Status: DC | PRN

## 2013-01-27 MED ORDER — DEXTROSE 5 % IV SOLN
3.0000 g | INTRAVENOUS | Status: DC | PRN
  Administered 2013-01-27: 3 g via INTRAVENOUS

## 2013-01-27 MED ORDER — KETOROLAC TROMETHAMINE 30 MG/ML IJ SOLN: 30.0000 mg | Freq: Four times a day (QID) | INTRAMUSCULAR | Status: AC | PRN

## 2013-01-27 MED ORDER — LACTATED RINGERS IV SOLN
INTRAVENOUS | Status: DC | PRN
  Administered 2013-01-27: 17:00:00 via INTRAVENOUS

## 2013-01-27 MED ORDER — SODIUM BICARBONATE 8.4 % IV SOLN
INTRAVENOUS | Status: AC
  Filled 2013-01-27: qty 50

## 2013-01-27 MED ORDER — SENNOSIDES-DOCUSATE SODIUM 8.6-50 MG PO TABS
2.0000 | ORAL_TABLET | Freq: Every day | ORAL | Status: DC
  Administered 2013-01-28 – 2013-01-29 (×2): 2 via ORAL

## 2013-01-27 MED ORDER — OXYCODONE-ACETAMINOPHEN 5-325 MG PO TABS: 1.0000 | ORAL_TABLET | ORAL | Status: DC | PRN

## 2013-01-27 MED ORDER — LACTATED RINGERS IV SOLN
INTRAVENOUS | Status: DC
  Administered 2013-01-27 (×2): via INTRAVENOUS

## 2013-01-27 MED ORDER — FENTANYL 2.5 MCG/ML BUPIVACAINE 1/10 % EPIDURAL INFUSION (WH - ANES)
14.0000 mL/h | INTRAMUSCULAR | Status: DC | PRN
  Administered 2013-01-27: 14 mL/h via EPIDURAL
  Filled 2013-01-27: qty 125

## 2013-01-27 MED ORDER — EPHEDRINE 5 MG/ML INJ
10.0000 mg | INTRAVENOUS | Status: DC | PRN
  Administered 2013-01-27: 10 mg via INTRAVENOUS
  Filled 2013-01-27: qty 2

## 2013-01-27 MED ORDER — PHENYLEPHRINE 40 MCG/ML (10ML) SYRINGE FOR IV PUSH (FOR BLOOD PRESSURE SUPPORT)
80.0000 ug | PREFILLED_SYRINGE | INTRAVENOUS | Status: DC | PRN
  Filled 2013-01-27: qty 5
  Filled 2013-01-27: qty 2

## 2013-01-27 MED ORDER — NALBUPHINE HCL 10 MG/ML IJ SOLN
5.0000 mg | INTRAMUSCULAR | Status: DC | PRN
  Filled 2013-01-27: qty 1

## 2013-01-27 MED ORDER — DEXTROSE 5 % IV SOLN
2.0000 g | Freq: Four times a day (QID) | INTRAVENOUS | Status: AC
  Administered 2013-01-27 – 2013-01-28 (×3): 2 g via INTRAVENOUS
  Filled 2013-01-27 (×4): qty 2

## 2013-01-27 MED ORDER — ACETAMINOPHEN 10 MG/ML IV SOLN
1000.0000 mg | Freq: Four times a day (QID) | INTRAVENOUS | Status: AC | PRN
  Filled 2013-01-27: qty 100

## 2013-01-27 MED ORDER — FENTANYL CITRATE 0.05 MG/ML IJ SOLN
25.0000 ug | INTRAMUSCULAR | Status: DC | PRN
  Administered 2013-01-27: 50 ug via INTRAVENOUS

## 2013-01-27 MED ORDER — CITRIC ACID-SODIUM CITRATE 334-500 MG/5ML PO SOLN
30.0000 mL | ORAL | Status: DC | PRN
  Administered 2013-01-27: 30 mL via ORAL
  Filled 2013-01-27: qty 15

## 2013-01-27 MED ORDER — SIMETHICONE 80 MG PO CHEW
80.0000 mg | CHEWABLE_TABLET | ORAL | Status: DC | PRN
  Administered 2013-01-28: 80 mg via ORAL

## 2013-01-27 MED ORDER — EPHEDRINE 5 MG/ML INJ
10.0000 mg | INTRAVENOUS | Status: DC | PRN
  Filled 2013-01-27: qty 4
  Filled 2013-01-27: qty 2

## 2013-01-27 MED ORDER — OXYTOCIN 10 UNIT/ML IJ SOLN
INTRAMUSCULAR | Status: AC
  Filled 2013-01-27: qty 4

## 2013-01-27 MED ORDER — LANOLIN HYDROUS EX OINT: 1.0000 "application " | TOPICAL_OINTMENT | CUTANEOUS | Status: DC | PRN

## 2013-01-27 MED ORDER — OXYCODONE-ACETAMINOPHEN 5-325 MG PO TABS
1.0000 | ORAL_TABLET | ORAL | Status: DC | PRN
  Administered 2013-01-28: 2 via ORAL
  Administered 2013-01-28: 1 via ORAL
  Administered 2013-01-28 – 2013-01-29 (×6): 2 via ORAL
  Administered 2013-01-30 (×2): 1 via ORAL
  Administered 2013-01-30: 2 via ORAL
  Administered 2013-01-30: 1 via ORAL
  Filled 2013-01-27 (×5): qty 2
  Filled 2013-01-27: qty 1
  Filled 2013-01-27 (×2): qty 2
  Filled 2013-01-27: qty 1
  Filled 2013-01-27 (×2): qty 2
  Filled 2013-01-27: qty 1

## 2013-01-27 MED ORDER — NALOXONE HCL 1 MG/ML IJ SOLN
1.0000 ug/kg/h | INTRAMUSCULAR | Status: DC | PRN
  Filled 2013-01-27: qty 2

## 2013-01-27 MED ORDER — SCOPOLAMINE 1 MG/3DAYS TD PT72: 1.0000 | MEDICATED_PATCH | Freq: Once | TRANSDERMAL | Status: AC

## 2013-01-27 MED ORDER — MENTHOL 3 MG MT LOZG: 1.0000 | LOZENGE | OROMUCOSAL | Status: DC | PRN

## 2013-01-27 MED ORDER — ONDANSETRON HCL 4 MG/2ML IJ SOLN
INTRAMUSCULAR | Status: DC | PRN
  Administered 2013-01-27: 4 mg via INTRAVENOUS

## 2013-01-27 MED ORDER — PROMETHAZINE HCL 25 MG/ML IJ SOLN: 6.2500 mg | INTRAMUSCULAR | Status: DC | PRN

## 2013-01-27 MED ORDER — EPHEDRINE 5 MG/ML INJ
INTRAVENOUS | Status: AC
  Filled 2013-01-27: qty 10

## 2013-01-27 MED ORDER — PHENYLEPHRINE 40 MCG/ML (10ML) SYRINGE FOR IV PUSH (FOR BLOOD PRESSURE SUPPORT)
PREFILLED_SYRINGE | INTRAVENOUS | Status: AC
  Filled 2013-01-27: qty 15

## 2013-01-27 MED ORDER — ONDANSETRON HCL 4 MG/2ML IJ SOLN: 4.0000 mg | Freq: Four times a day (QID) | INTRAMUSCULAR | Status: DC | PRN

## 2013-01-27 MED ORDER — SIMETHICONE 80 MG PO CHEW
80.0000 mg | CHEWABLE_TABLET | Freq: Three times a day (TID) | ORAL | Status: DC
  Administered 2013-01-28 – 2013-01-30 (×7): 80 mg via ORAL

## 2013-01-27 MED ORDER — DEXTROSE 5 % IV SOLN
INTRAVENOUS | Status: AC
  Filled 2013-01-27: qty 3000

## 2013-01-27 MED ORDER — PHENYLEPHRINE 40 MCG/ML (10ML) SYRINGE FOR IV PUSH (FOR BLOOD PRESSURE SUPPORT)
PREFILLED_SYRINGE | INTRAVENOUS | Status: AC
  Filled 2013-01-27: qty 5

## 2013-01-27 MED ORDER — KETOROLAC TROMETHAMINE 30 MG/ML IJ SOLN
30.0000 mg | Freq: Four times a day (QID) | INTRAMUSCULAR | Status: AC | PRN
  Administered 2013-01-28: 30 mg via INTRAVENOUS
  Filled 2013-01-27: qty 1

## 2013-01-27 MED ORDER — ACETAMINOPHEN 325 MG PO TABS: 650.0000 mg | ORAL_TABLET | ORAL | Status: DC | PRN

## 2013-01-27 MED ORDER — KETOROLAC TROMETHAMINE 30 MG/ML IJ SOLN
INTRAMUSCULAR | Status: AC
  Administered 2013-01-27: 30 mg via INTRAVENOUS
  Filled 2013-01-27: qty 1

## 2013-01-27 MED ORDER — DIPHENHYDRAMINE HCL 25 MG PO CAPS
25.0000 mg | ORAL_CAPSULE | Freq: Four times a day (QID) | ORAL | Status: DC | PRN
  Filled 2013-01-27: qty 1

## 2013-01-27 MED ORDER — NALOXONE HCL 0.4 MG/ML IJ SOLN: 0.4000 mg | INTRAMUSCULAR | Status: DC | PRN

## 2013-01-27 MED ORDER — LIDOCAINE HCL (PF) 1 % IJ SOLN
30.0000 mL | INTRAMUSCULAR | Status: DC | PRN
  Filled 2013-01-27: qty 30

## 2013-01-27 MED ORDER — ONDANSETRON HCL 4 MG/2ML IJ SOLN: 4.0000 mg | INTRAMUSCULAR | Status: DC | PRN

## 2013-01-27 MED ORDER — OXYTOCIN 40 UNITS IN LACTATED RINGERS INFUSION - SIMPLE MED
62.5000 mL/h | INTRAVENOUS | Status: AC
  Administered 2013-01-27: 62.5 mL/h via INTRAVENOUS

## 2013-01-27 MED ORDER — LIDOCAINE-EPINEPHRINE (PF) 2 %-1:200000 IJ SOLN
INTRAMUSCULAR | Status: AC
  Filled 2013-01-27: qty 20

## 2013-01-27 MED ORDER — NALBUPHINE HCL 10 MG/ML IJ SOLN
5.0000 mg | INTRAMUSCULAR | Status: DC | PRN
  Administered 2013-01-27 – 2013-01-28 (×3): 10 mg via SUBCUTANEOUS
  Filled 2013-01-27 (×3): qty 1

## 2013-01-27 SURGICAL SUPPLY — 44 items
ADH SKN CLS APL DERMABOND .7 (GAUZE/BANDAGES/DRESSINGS) ×1
CANISTER WOUND CARE 500ML ATS (WOUND CARE) IMPLANT
CLAMP CORD UMBIL (MISCELLANEOUS) IMPLANT
CLOTH BEACON ORANGE TIMEOUT ST (SAFETY) ×2 IMPLANT
CONTAINER PREFILL 10% NBF 15ML (MISCELLANEOUS) ×4 IMPLANT
DERMABOND ADVANCED (GAUZE/BANDAGES/DRESSINGS) ×1
DERMABOND ADVANCED .7 DNX12 (GAUZE/BANDAGES/DRESSINGS) ×1 IMPLANT
DRAPE LG THREE QUARTER DISP (DRAPES) ×2 IMPLANT
DRSG OPSITE 6X11 MED (GAUZE/BANDAGES/DRESSINGS) ×1 IMPLANT
DRSG OPSITE POSTOP 4X10 (GAUZE/BANDAGES/DRESSINGS) ×2 IMPLANT
DRSG VAC ATS LRG SENSATRAC (GAUZE/BANDAGES/DRESSINGS) IMPLANT
DRSG VAC ATS MED SENSATRAC (GAUZE/BANDAGES/DRESSINGS) IMPLANT
DRSG VAC ATS SM SENSATRAC (GAUZE/BANDAGES/DRESSINGS) IMPLANT
DURAPREP 26ML APPLICATOR (WOUND CARE) ×2 IMPLANT
ELECT REM PT RETURN 9FT ADLT (ELECTROSURGICAL) ×2
ELECTRODE REM PT RTRN 9FT ADLT (ELECTROSURGICAL) ×1 IMPLANT
EXTRACTOR VACUUM M CUP 4 TUBE (SUCTIONS) IMPLANT
GLOVE BIO SURGEON STRL SZ8 (GLOVE) ×4 IMPLANT
GOWN PREVENTION PLUS XLARGE (GOWN DISPOSABLE) ×2 IMPLANT
GOWN STRL REIN XL XLG (GOWN DISPOSABLE) ×4 IMPLANT
KIT ABG SYR 3ML LUER SLIP (SYRINGE) IMPLANT
NDL HYPO 25X5/8 SAFETYGLIDE (NEEDLE) ×1 IMPLANT
NEEDLE HYPO 25X5/8 SAFETYGLIDE (NEEDLE) ×2 IMPLANT
NS IRRIG 1000ML POUR BTL (IV SOLUTION) ×2 IMPLANT
PACK C SECTION WH (CUSTOM PROCEDURE TRAY) ×2 IMPLANT
PAD OB MATERNITY 4.3X12.25 (PERSONAL CARE ITEMS) ×2 IMPLANT
RTRCTR C-SECT PINK 25CM LRG (MISCELLANEOUS) ×2 IMPLANT
STAPLER VISISTAT 35W (STAPLE) IMPLANT
SUT GUT PLAIN 0 CT-3 TAN 27 (SUTURE) IMPLANT
SUT MNCRL 0 VIOLET CTX 36 (SUTURE) ×3 IMPLANT
SUT MNCRL AB 4-0 PS2 18 (SUTURE) IMPLANT
SUT MON AB 2-0 CT1 27 (SUTURE) ×2 IMPLANT
SUT MON AB 3-0 SH 27 (SUTURE)
SUT MON AB 3-0 SH27 (SUTURE) IMPLANT
SUT MONOCRYL 0 CTX 36 (SUTURE) ×3
SUT PDS AB 0 CTX 60 (SUTURE) ×1 IMPLANT
SUT PLAIN 2 0 XLH (SUTURE) IMPLANT
SUT VIC AB 0 CTX 36 (SUTURE) ×4
SUT VIC AB 0 CTX36XBRD ANBCTRL (SUTURE) IMPLANT
SUT VIC AB 2-0 CT1 27 (SUTURE)
SUT VIC AB 2-0 CT1 TAPERPNT 27 (SUTURE) IMPLANT
TOWEL OR 17X24 6PK STRL BLUE (TOWEL DISPOSABLE) ×6 IMPLANT
TRAY FOLEY CATH 14FR (SET/KITS/TRAYS/PACK) ×2 IMPLANT
WATER STERILE IRR 1000ML POUR (IV SOLUTION) ×1 IMPLANT

## 2013-01-27 NOTE — Anesthesia Postprocedure Evaluation (Signed)
  Anesthesia Post Note  Patient: Alicia [name]  Procedure(s) Performed: Procedure(s) (LRB): CESAREAN SECTION (N/A)  Anesthesia type: epidural  Patient location: PACU  Post pain: Pain level controlled  Post assessment: Post-op Vital signs reviewed  Last Vitals:  Filed Vitals:   01/27/13 1815  BP: 136/54  Pulse: 115  Temp:   Resp: 21    Post vital signs: Reviewed  Level of consciousness: awake  Complications: No apparent anesthesia complications

## 2013-01-27 NOTE — MAU Note (Signed)
Pt states at 0650 had gush of clear/blood tinged fluid. Contractions were q5 minutes apart, now are coming more frequently. Unsure of dilation

## 2013-01-27 NOTE — MAU Note (Signed)
C/o ucs; c/o SROM @ [id number];

## 2013-01-27 NOTE — Progress Notes (Signed)
Dr Clearance Coots viewing FHT's strip from office.  States no concerns regarding strip at this time.  Continue current plan of care.

## 2013-01-27 NOTE — Anesthesia Procedure Notes (Signed)

## 2013-01-27 NOTE — Transfer of Care (Signed)
Immediate Anesthesia Transfer of Care Note  Patient: Alicia [name]  Procedure(s) Performed: Procedure(s): CESAREAN SECTION (N/A)  Patient Location: PACU  Anesthesia Type:Epidural  Level of Consciousness: awake, alert  and oriented  Airway & Oxygen Therapy: Patient Spontanous Breathing  Post-op Assessment: Report given to PACU RN and Post -op Vital signs reviewed and stable  Post vital signs: Reviewed and stable  Complications: No apparent anesthesia complications

## 2013-01-27 NOTE — H&P (Signed)
Alicia [name] is a 32 y.o. female presenting for SROM. Maternal Medical History:  Reason for admission: Rupture of membranes and contractions.  31 y o G3 P2.  EDC 02-20-13.  Presents with c/o leaking fluid per vagina and UC's.  Contractions: Onset was 3-5 hours ago.   Frequency: irregular.    Fetal activity: Perceived fetal activity is normal.   Last perceived fetal movement was within the past hour.    Prenatal complications: no prenatal complications Prenatal Complications - Diabetes: none.    OB History   Grav Para Term Preterm Abortions TAB SAB Ect Mult Living   3 2 2       2      Past Medical History  Diagnosis Date  . Anxiety   . Depression     No med. currently, Stopped with + UPT  . Bladder infection   . Gonorrhea   . Complication of anesthesia     Epidural did not work w/2nd preg. labor  . Osteoarthritis 06/27/2012  . Hemorrhoids 06/27/2012  . Depression 06/27/2012  . Panic disorder 06/27/2012   Past Surgical History  Procedure Laterality Date  . Cesarean section      X 1 1st preg., then VBAC  . Breast mass excision      Benign  . Mouth surgery     Family History: family history is negative for Other, and Alcohol abuse, and Arthritis, and Asthma, and Birth defects, and COPD, and Cancer, and Depression, and Drug abuse, and Diabetes, and Early death, and Hearing loss, and Heart disease, and Hyperlipidemia, and Hypertension, and Kidney disease, and Learning disabilities, and Mental illness, and Mental retardation, and Miscarriages / Stillbirths, and Stroke, and Vision loss, . Social History:  reports that she quit smoking about 7 years ago. She has never used smokeless tobacco. She reports that she does not drink alcohol or use illicit drugs.   Prenatal Transfer Tool  Maternal Diabetes: No Genetic Screening: Normal Maternal Ultrasounds/Referrals: Normal Fetal Ultrasounds or other Referrals:  None Maternal Substance Abuse:  No Significant Maternal Medications:   None Significant Maternal Lab Results:  None Other Comments:  None  Review of Systems  All other systems reviewed and are negative.    Dilation: 2 Effacement (%): 50 Station: -3 Exam by:: Morrison Old RN Blood pressure 126/50, pulse 74, temperature 98.2 F (36.8 C), temperature source Oral, resp. rate 20, height 5\' 2"  (1.575 m), weight 269 lb (122.018 kg), last menstrual period 05/05/2012. Maternal Exam:  Uterine Assessment: Contraction strength is mild.  Contraction frequency is irregular.   Abdomen: Patient reports no abdominal tenderness. Fetal presentation: vertex  Introitus: Normal vulva. Normal vagina.    Physical Exam  Nursing note and vitals reviewed. Constitutional: She is oriented to person, place, and time. She appears well-developed and well-nourished.  HENT:  Head: Normocephalic and atraumatic.  Eyes: Conjunctivae are normal. Pupils are equal, round, and reactive to light.  Neck: Normal range of motion. Neck supple.  Cardiovascular: Normal rate and regular rhythm.   Respiratory: Effort normal and breath sounds normal.  GI: Soft.  Genitourinary: Vagina normal and uterus normal.  Musculoskeletal: Normal range of motion.  Neurological: She is alert and oriented to person, place, and time.  Skin: Skin is warm and dry.  Psychiatric: She has a normal mood and affect. Her behavior is normal. Judgment and thought content normal.    Prenatal labs: ABO, Rh: --/--/O POS (10/27 1440) Antibody: Negative (11/01 0000) Rubella: Immune (11/01 0000) RPR: NON REAC (03/19 1012)  HBsAg: Negative (11/01 0000)  HIV: NON REACTIVE (03/19 1012)  GBS: NEGATIVE (05/29 1547)   Assessment/Plan: 36 weeks.  SROM.  VBAC.  Pitocin prn.   Daeron Carreno A 01/27/2013, 11:34 AM

## 2013-01-27 NOTE — Op Note (Signed)
Cesarean Section Procedure Note   Alicia [name]   01/27/2013  Indications: Persistent FHR decelerations.  Severe variable decels down to 60 bpm, lasting 2-3 minutes.  Pre-operative Diagnosis: Fetal Intolerance to Labor.  Severe persistent variable FHR decels.  Persistent OP position.  Post-operative Diagnosis: Same .  Multiple loops of occult cord adjacent to vertex.  Surgeon: Brock Bad  Assistants: Francoise Ceo  Anesthesia: epidural  Procedure Details:  The patient was seen in the Holding Room. The risks, benefits, complications, treatment options, and expected outcomes were discussed with the patient. The patient concurred with the proposed plan, giving informed consent. The patient was identified as [name] and the procedure verified as C-Section Delivery. A Time Out was held and the above information confirmed.  After induction of anesthesia, the patient was draped and prepped in the usual sterile manner. A transverse incision was made and carried down through the subcutaneous tissue to the fascia. The fascial incision was made and extended transversely. The fascia was separated from the underlying rectus tissue superiorly and inferiorly. The peritoneum was identified and entered. The peritoneal incision was extended longitudinally. The utero-vesical peritoneal reflection was incised transversely and the bladder flap was bluntly freed from the lower uterine segment. A low transverse uterine incision was made. Delivered from cephalic presentation was a 3255 gram living newborn female infant(s). APGAR (1 MIN): 4   APGAR (5 MINS): 8    APGAR (10 MINS):    A cord ph was sent.  Cord pH was 7.15. The umbilical cord was clamped and cut cord. A sample was obtained for evaluation. The placenta was removed Intact and appeared normal.  The uterine incision was closed with running locked sutures of 1-0 Monocryl. A second imbricating layer of the same suture was placed.  Hemostasis was  observed. The paracolic gutters were irrigated. The parieto peritoneum was closed in a running fashion with 2-0 Vicryl.  The fascia was then reapproximated with running sutures of 0 Vicryl.  The skin was closed with staples.  Instrument, sponge, and needle counts were correct prior the abdominal closure and were correct at the conclusion of the case.    Findings:  Direct OP position of vertex, hyperextended with occult cord.  Normal uterus, ovaries and tubes.   Estimated Blood Loss:  Total IV Fluids:   Urine Output: 700CC OF clear urine  Specimens: @ORSPECIMEN @   Complications: no complications  Disposition: PACU - hemodynamically stable.  Maternal Condition: stable   Baby condition / location:  nursery-stable    Signed: Surgeon(s): Brock Bad, MD Kathreen Cosier, MD

## 2013-01-27 NOTE — Anesthesia Preprocedure Evaluation (Addendum)
Anesthesia Evaluation  Patient identified by MRN, date of birth, ID band Patient awake    Reviewed: Allergy & Precautions, H&P , Patient's Chart, lab work & pertinent test results  Airway Mallampati: IV TM Distance: <3 FB     Dental no notable dental hx.    Pulmonary neg pulmonary ROS,  breath sounds clear to auscultation  Pulmonary exam normal       Cardiovascular negative cardio ROS  Rhythm:regular Rate:Normal     Neuro/Psych PSYCHIATRIC DISORDERS negative neurological ROS  negative psych ROS   GI/Hepatic negative GI ROS, Neg liver ROS, GERD-  ,  Endo/Other  negative endocrine ROSMorbid obesity  Renal/GU negative Renal ROS     Musculoskeletal   Abdominal   Peds  Hematology negative hematology ROS (+)   Anesthesia Other Findings Anxiety     Depression   No med. currently, Stopped with + UPT    Bladder infection     Gonorrhea        Complication of anesthesia   Epidural did not work w/2nd preg. labor Osteoarthritis 06/27/2012      Hemorrhoids 06/27/2012   Depression 06/27/2012      Panic disorder 06/27/2012    Reproductive/Obstetrics (+) Pregnancy                          Anesthesia Physical Anesthesia Plan  ASA: III  Anesthesia Plan: Epidural   Post-op Pain Management:    Induction:   Airway Management Planned:   Additional Equipment:   Intra-op Plan:   Post-operative Plan:   Informed Consent: I have reviewed the patients History and Physical, chart, labs and discussed the procedure including the risks, benefits and alternatives for the proposed anesthesia with the patient or authorized representative who has indicated his/her understanding and acceptance.     Plan Discussed with:   Anesthesia Plan Comments:         Anesthesia Quick Evaluation

## 2013-01-28 ENCOUNTER — Encounter (HOSPITAL_COMMUNITY): Payer: Self-pay | Admitting: Obstetrics

## 2013-01-28 LAB — CBC
HCT: 34.4 % — ABNORMAL LOW (ref 36.0–46.0)
Hemoglobin: 10.7 g/dL — ABNORMAL LOW (ref 12.0–15.0)
MCH: 23.3 pg — ABNORMAL LOW (ref 26.0–34.0)
MCHC: 31.1 g/dL (ref 30.0–36.0)
RDW: 16.7 % — ABNORMAL HIGH (ref 11.5–15.5)

## 2013-01-28 NOTE — Anesthesia Postprocedure Evaluation (Signed)
  Anesthesia Post-op Note  Patient: Alicia [name]  Procedure(s) Performed: Procedure(s): CESAREAN SECTION (N/A)  Patient Location: Mother/Baby  Anesthesia Type:Epidural  Level of Consciousness: awake, alert , oriented and patient cooperative  Airway and Oxygen Therapy: Patient Spontanous Breathing  Post-op Pain: mild  Post-op Assessment: Patient's Cardiovascular Status Stable and Respiratory Function Stable  Post-op Vital Signs: stable  Complications: No apparent anesthesia complications

## 2013-01-28 NOTE — Progress Notes (Signed)
UR chart review completed.  

## 2013-01-28 NOTE — Progress Notes (Signed)
Phone call to Dr. Tamela Oddi at patient request regarding IV removal. Discussed condition with MD, and she will place new orders later.

## 2013-01-28 NOTE — Progress Notes (Signed)
Patient ID: Alicia [name], female   DOB: 02/04/1981, 32 y.o.   MRN: [id number] Subjective: POD# 1 s/p Cesarean Delivery.  Indications: scheduled  RH status/Rubella reviewed. Feeding: bottle Patient reports tolerating PO.  Denies HA/SOB/C/P/N/V/dizziness.   Breast symptoms: no.  She reports vaginal bleeding as normal, without clots.  She is ambulating, urinating without difficulty.     Objective: Vital signs in last 24 hours: BP 136/68  Pulse 85  Temp(Src) 98 F (36.7 C) (Oral)  Resp 18  Ht 5\' 2"  (1.575 m)  Wt 269 lb (122.018 kg)  BMI 49.19 kg/m2  SpO2 95%  LMP 05/05/2012       Physical Exam:  General: alert CV: Regular rate and rhythm Resp: clear Abdomen: soft, nontender, normal bowel sounds Lochia: minimal Uterine Fundus: firm, below umbilicus, nontender Incision: Dressing C/D Ext: extremities normal, atraumatic, no cyanosis or edema    Recent Labs  01/27/13 1005 01/28/13 0610  HGB 11.7* 10.7*  HCT 37.5 34.4*      Assessment/Plan: 32 y.o.  status post Cesarean section. POD# 1.   Doing well, stable.              Advance diet as tolerated Start po pain meds D/C foley  HLIV  Ambulate IS Routine post-op care  JACKSON-MOORE,Kobie Whidby A 01/28/2013, 9:19 AM

## 2013-01-29 NOTE — Progress Notes (Signed)
Subjective: Postpartum Day 2: Cesarean Delivery Patient reports tolerating PO and no problems voiding.    Objective: Vital signs in last 24 hours: Temp:  [97.7 F (36.5 C)-98.7 F (37.1 C)] 97.7 F (36.5 C) (06/05 0547) Pulse Rate:  [79-90] 82 (06/05 0547) Resp:  [20-22] 20 (06/05 0547) BP: (108-128)/(72-87) 125/75 mmHg (06/05 0547) SpO2:  [97 %] 97 % (06/05 0547)  Physical Exam:  General: alert and no distress Lochia: appropriate Uterine Fundus: firm Incision: healing well DVT Evaluation: No evidence of DVT seen on physical exam.   Recent Labs  01/27/13 1005 01/28/13 0610  HGB 11.7* 10.7*  HCT 37.5 34.4*    Assessment/Plan: Status post Cesarean section. Doing well postoperatively.  Continue current care.  Lahela Woodin A 01/29/2013, 8:31 AM

## 2013-01-29 NOTE — Progress Notes (Signed)
Dressing with old blood, not 75% saturated, plenty of wicking left, dressing not changed per protocol.  Patient instructed she could shower, but if dressing began to come off (not be sealed) to call RN

## 2013-01-30 MED ORDER — BISACODYL 10 MG RE SUPP
10.0000 mg | Freq: Once | RECTAL | Status: AC
  Administered 2013-01-30: 10 mg via RECTAL
  Filled 2013-01-30: qty 1

## 2013-01-30 MED ORDER — BISACODYL 10 MG RE SUPP: 10.0000 mg | Freq: Once | RECTAL | Status: DC

## 2013-01-30 MED ORDER — MAGNESIUM HYDROXIDE 400 MG/5ML PO SUSP
30.0000 mL | Freq: Once | ORAL | Status: AC
  Administered 2013-01-30: 30 mL via ORAL
  Filled 2013-01-30: qty 30

## 2013-01-30 MED ORDER — BISACODYL 10 MG RE SUPP
10.0000 mg | Freq: Once | RECTAL | Status: AC
Start: 2013-01-30 — End: 2013-01-30
  Administered 2013-01-30: 10 mg via RECTAL
  Filled 2013-01-30: qty 1

## 2013-01-30 MED ORDER — OXYCODONE-ACETAMINOPHEN 5-325 MG PO TABS: 1.0000 | ORAL_TABLET | ORAL | Status: DC | PRN

## 2013-01-30 MED ORDER — MAGNESIUM HYDROXIDE 400 MG/5ML PO SUSP: 30.0000 mL | Freq: Once | ORAL | Status: DC

## 2013-01-30 MED ORDER — IBUPROFEN 600 MG PO TABS: 600.0000 mg | ORAL_TABLET | Freq: Four times a day (QID) | ORAL | Status: DC | PRN

## 2013-01-30 NOTE — Discharge Summary (Signed)
Obstetric Discharge Summary Reason for Admission: rupture of membranes Prenatal Procedures: NST and ultrasound Intrapartum Procedures: cesarean: low cervical, transverse Postpartum Procedures: none Complications-Operative and Postpartum: none Hemoglobin  Date Value Range Status  01/28/2013 10.7* 12.0 - 15.0 g/dL Final     HCT  Date Value Range Status  01/28/2013 34.4* 36.0 - 46.0 % Final    Physical Exam:  General: alert and no distress Lochia: appropriate Uterine Fundus: firm Incision: healing well DVT Evaluation: No evidence of DVT seen on physical exam.  Discharge Diagnoses: Term Pregnancy-delivered  Discharge Information: Date: 01/30/2013 Activity: pelvic rest Diet: routine Medications: PNV, Ibuprofen, Colace and Percocet Condition: stable Instructions: refer to practice specific booklet Discharge to: home Follow-up Information   Follow up with Antionette Char A, MD. Schedule an appointment as soon as possible for a visit in 1 week. (Come to office on Tuesday for removal of staples.)    Contact information:   [address] [place] [address] [phone]       Newborn Data: Live born female  Birth Weight: 7 lb 2.8 oz (3255 g) APGAR: 4, 8  Home with mother.  Loraine Freid A 01/30/2013, 7:17 AM

## 2013-01-30 NOTE — Progress Notes (Signed)
Subjective: Postpartum Day 3: Cesarean Delivery Patient reports tolerating PO.    Objective: Vital signs in last 24 hours: Temp:  [97.9 F (36.6 C)] 97.9 F (36.6 C) (06/06 0615) Pulse Rate:  [99-100] 100 (06/06 0615) Resp:  [18-20] 18 (06/06 0615) BP: (125-129)/(73-83) 129/73 mmHg (06/06 0615)  Physical Exam:  General: alert and no distress Lochia: appropriate Uterine Fundus: firm Incision: healing well DVT Evaluation: No evidence of DVT seen on physical exam.   Recent Labs  01/27/13 1005 01/28/13 0610  HGB 11.7* 10.7*  HCT 37.5 34.4*    Assessment/Plan: Status post Cesarean section. Doing well postoperatively.  Discharge home with standard precautions and return to clinic in 2 weeks.  HARPER,CHARLES A 01/30/2013, 7:02 AM

## 2013-01-30 NOTE — Progress Notes (Signed)

## 2013-02-03 ENCOUNTER — Encounter: Payer: Self-pay | Admitting: Obstetrics

## 2013-02-03 ENCOUNTER — Ambulatory Visit (INDEPENDENT_AMBULATORY_CARE_PROVIDER_SITE_OTHER): Payer: Medicaid Other | Admitting: Obstetrics

## 2013-02-03 NOTE — Progress Notes (Signed)
.   Subjective:     Alicia [name] is a 32 y.o. female here for her staples removal.  Patient had a c-section on 01/27/13.  No current complaints  Personal health questionnaire reviewed: yes.   Gynecologic History Patient's last menstrual period was 05/05/2012. Contraception: none  Obstetric History OB History   Grav Para Term Preterm Abortions TAB SAB Ect Mult Living   3 3 2 1      3      # Outc Date GA Lbr Len/2nd Wgt Sex Del Anes PTL Lv   1 TRM 6/01 [redacted]w[redacted]d  6lb6oz(2.892kg) M CS EPI     2 TRM 12/07 [redacted]w[redacted]d  7lb3oz(3.26kg) M VBAC EPI     3 PRE 6/14 [redacted]w[redacted]d 00:00 / 00:46 7lb2.8oz(3.255kg) M LTCS EPI  Yes       The following portions of the patient's history were reviewed and updated as appropriate: allergies, current medications, past family history, past medical history, past social history, past surgical history and problem list.  Review of Systems A comprehensive review of systems was negative.    Objective:    General appearance: alert, no distress and morbidly obese Abdomen: normal findings: soft, non-tender and incision C, D, I.  Staples removed and steri strips applied.    Assessment:    Healthy female exam.  Postpartum.  S/P cesarean section.  Doing well.  Staples removed.   Plan:    Follow up in: 6 weeks.

## 2013-03-17 ENCOUNTER — Ambulatory Visit: Payer: Medicaid Other | Admitting: Obstetrics

## 2013-07-02 ENCOUNTER — Other Ambulatory Visit: Payer: Self-pay

## 2014-06-28 ENCOUNTER — Encounter: Payer: Self-pay | Admitting: Obstetrics

## 2014-11-09 ENCOUNTER — Other Ambulatory Visit: Payer: Self-pay | Admitting: Obstetrics

## 2014-11-10 NOTE — Telephone Encounter (Signed)
Please advise 

## 2015-03-05 ENCOUNTER — Encounter (HOSPITAL_COMMUNITY): Payer: Self-pay

## 2015-03-05 ENCOUNTER — Emergency Department (HOSPITAL_COMMUNITY)
Admission: EM | Admit: 2015-03-05 | Discharge: 2015-03-05 | Disposition: A | Discharge status: Home / Self Care | Payer: Medicaid Other | Attending: Emergency Medicine | Admitting: Emergency Medicine

## 2015-03-05 DIAGNOSIS — Z87891 Personal history of nicotine dependence: Secondary | ICD-10-CM | POA: Diagnosis not present

## 2015-03-05 DIAGNOSIS — N390 Urinary tract infection, site not specified: Secondary | ICD-10-CM | POA: Diagnosis not present

## 2015-03-05 DIAGNOSIS — F329 Major depressive disorder, single episode, unspecified: Secondary | ICD-10-CM | POA: Insufficient documentation

## 2015-03-05 DIAGNOSIS — Z3202 Encounter for pregnancy test, result negative: Secondary | ICD-10-CM | POA: Diagnosis not present

## 2015-03-05 DIAGNOSIS — Z79899 Other long term (current) drug therapy: Secondary | ICD-10-CM | POA: Diagnosis not present

## 2015-03-05 DIAGNOSIS — Z8619 Personal history of other infectious and parasitic diseases: Secondary | ICD-10-CM | POA: Insufficient documentation

## 2015-03-05 DIAGNOSIS — R300 Dysuria: Secondary | ICD-10-CM | POA: Diagnosis present

## 2015-03-05 DIAGNOSIS — F419 Anxiety disorder, unspecified: Secondary | ICD-10-CM | POA: Insufficient documentation

## 2015-03-05 LAB — POC URINE PREG, ED: Preg Test, Ur: NEGATIVE

## 2015-03-05 LAB — URINALYSIS, ROUTINE W REFLEX MICROSCOPIC
GLUCOSE, UA: NEGATIVE mg/dL
Ketones, ur: NEGATIVE mg/dL
Nitrite: NEGATIVE
PROTEIN: NEGATIVE mg/dL
Specific Gravity, Urine: >1.03 — ABNORMAL HIGH (ref 1.005–1.030)
UROBILINOGEN UA: 2 mg/dL — AB (ref 0.0–1.0)
pH: 6 (ref 5.0–8.0)

## 2015-03-05 LAB — URINE MICROSCOPIC-ADD ON

## 2015-03-05 MED ORDER — CEPHALEXIN 500 MG PO CAPS: 500.0000 mg | ORAL_CAPSULE | Freq: Four times a day (QID) | ORAL | Status: DC

## 2015-03-05 MED ORDER — PHENAZOPYRIDINE HCL 200 MG PO TABS: 200.0000 mg | ORAL_TABLET | Freq: Three times a day (TID) | ORAL | Status: DC

## 2015-03-05 MED ORDER — PHENAZOPYRIDINE HCL 100 MG PO TABS
200.0000 mg | ORAL_TABLET | Freq: Once | ORAL | Status: AC
Start: 2015-03-05 — End: 2015-03-05
  Administered 2015-03-05: 200 mg via ORAL
  Filled 2015-03-05: qty 2

## 2015-03-05 MED ORDER — CEPHALEXIN 250 MG PO CAPS
500.0000 mg | ORAL_CAPSULE | Freq: Once | ORAL | Status: AC
  Administered 2015-03-05: 500 mg via ORAL
  Filled 2015-03-05: qty 2

## 2015-03-05 NOTE — Discharge Instructions (Signed)

## 2015-03-05 NOTE — ED Notes (Signed)
Pt attempting to provide urine sample

## 2015-03-05 NOTE — ED Provider Notes (Signed)
CSN: [id number]     Arrival date & time 03/05/15  1356 History   First MD Initiated Contact with Patient 03/05/15 1538     Chief Complaint  Patient presents with  . Dysuria     (Consider location/radiation/quality/duration/timing/severity/associated sxs/prior Treatment) HPI    PCP: Pcp Not In System Blood pressure 113/54, pulse 72, temperature 98.1 F (36.7 C), temperature source Oral, resp. rate 18, height 5\' 1"  (1.549 m), weight 279 lb (126.554 kg), last menstrual period 02/28/2015, SpO2 98 %.  Alicia [name] is a 34 y.o.female with a significant PMH of anxiety, depression, bladder infection, gonorrhea, hemorrhoids, depression, panic disorder,  presents to the ER with complaints of dysuria for  1 week. She is currently menstruating but denies abnormal cramps,abdominal pain,  or vaginal pain/discharge. She has not had back pain or flank pain. No pain is not urinating.  The patient denies diaphoresis, fever, headache, weakness (general or focal), confusion, change of vision,  neck pain, dysphagia, aphagia, chest pain, shortness of breath,  back pain, abdominal pains, nausea, vomiting, diarrhea, lower extremity swelling, rash.  Past Medical History  Diagnosis Date  . Anxiety   . Depression     No med. currently, Stopped with + UPT  . Bladder infection   . Gonorrhea   . Complication of anesthesia     Epidural did not work w/2nd preg. labor  . Osteoarthritis 06/27/2012  . Hemorrhoids 06/27/2012  . Depression 06/27/2012  . Panic disorder 06/27/2012   Past Surgical History  Procedure Laterality Date  . Cesarean section      X 1 1st preg., then VBAC  . Breast mass excision      Benign  . Mouth surgery    . Cesarean section N/A 01/27/2013    Procedure: CESAREAN SECTION;  Surgeon: Shelly Bombard, MD;  Location: [facility];  Service: Obstetrics;  Laterality: N/A;   Family History  Problem Relation Age of Onset  . Other Neg Hx   . Alcohol abuse Neg Hx   . Arthritis Neg Hx   . Asthma  Neg Hx   . Birth defects Neg Hx   . COPD Neg Hx   . Cancer Neg Hx   . Depression Neg Hx   . Drug abuse Neg Hx   . Diabetes Neg Hx   . Early death Neg Hx   . Hearing loss Neg Hx   . Heart disease Neg Hx   . Hyperlipidemia Neg Hx   . Hypertension Neg Hx   . Kidney disease Neg Hx   . Learning disabilities Neg Hx   . Mental illness Neg Hx   . Mental retardation Neg Hx   . Miscarriages / Stillbirths Neg Hx   . Stroke Neg Hx   . Vision loss Neg Hx    History  Substance Use Topics  . Smoking status: Former Smoker    Quit date: 06/22/2005  . Smokeless tobacco: Never Used  . Alcohol Use: No     Comment: Every other month   OB History    Gravida Para Term Preterm AB TAB SAB Ectopic Multiple Living   3 3 2 1      3      Review of Systems  10 Systems reviewed and are negative for acute change except as noted in the HPI.   Allergies  Review of patient's allergies indicates no known allergies.  Home Medications   Prior to Admission medications   Medication Sig Start Date End Date Taking? Authorizing Provider  ABILIFY 10 MG tablet Take 10 mg by mouth daily. 02/19/15  Yes Historical Provider, MD  clonazePAM (KLONOPIN) 1 MG tablet Take 1 mg by mouth 2 (two) times daily as needed for anxiety. anxiety   Yes Historical Provider, MD  DULoxetine (CYMBALTA) 60 MG capsule Take 60 mg by mouth daily. 02/19/15  Yes Historical Provider, MD  ibuprofen (ADVIL,MOTRIN) 600 MG tablet TAKE 1 TABLET BY MOUTH EVERY 6 HOURS AS NEEDED FOR PAIN 11/10/14  Yes Shelly Bombard, MD  ketorolac (TORADOL) 10 MG tablet Take 10 mg by mouth every 8 (eight) hours as needed. for pain 02/27/15  Yes Historical Provider, MD  loratadine (CLARITIN) 10 MG tablet Take 10 mg by mouth daily. 02/19/15  Yes Historical Provider, MD  traMADol (ULTRAM) 50 MG tablet Take 50 mg by mouth every 12 (twelve) hours as needed. for pain 12/31/14  Yes Historical Provider, MD  zolpidem (AMBIEN) 10 MG tablet Take 10 mg by mouth at bedtime. 02/27/15   Yes Historical Provider, MD  bisacodyl (DULCOLAX) 10 MG suppository Place 1 suppository (10 mg total) rectally once. 01/30/13   Shelly Bombard, MD  cephALEXin (KEFLEX) 500 MG capsule Take 1 capsule (500 mg total) by mouth 4 (four) times daily. 03/05/15   Siddarth Hsiung Carlota Raspberry, PA-C  magnesium hydroxide (MILK OF MAGNESIA) 400 MG/5ML suspension Take 30 mLs by mouth once. 01/30/13   Shelly Bombard, MD  oxyCODONE-acetaminophen (PERCOCET/ROXICET) 5-325 MG per tablet Take 1-2 tablets by mouth every 4 (four) hours as needed for pain. 01/30/13   Shelly Bombard, MD  phenazopyridine (PYRIDIUM) 200 MG tablet Take 1 tablet (200 mg total) by mouth 3 (three) times daily. 03/05/15   Reeda Soohoo Carlota Raspberry, PA-C   BP 115/45 mmHg  Pulse 61  Temp(Src) 98.1 F (36.7 C) (Oral)  Resp 20  Ht 5\' 1"  (1.549 m)  Wt 279 lb (126.554 kg)  BMI 52.74 kg/m2  SpO2 98%  LMP 02/28/2015 Physical Exam  Constitutional: She appears well-developed and well-nourished. No distress.  HENT:  Head: Normocephalic and atraumatic.  Eyes: Pupils are equal, round, and reactive to light.  Neck: Normal range of motion. Neck supple.  Cardiovascular: Normal rate and regular rhythm.   Pulmonary/Chest: Effort normal.  Abdominal: Soft. Bowel sounds are normal. She exhibits no distension. There is tenderness. There is no rigidity, no rebound, no guarding and no CVA tenderness.  Exam limited by body habitus  Neurological: She is alert.  Skin: Skin is warm and dry.  Nursing note and vitals reviewed.   ED Course  Procedures (including critical care time) Labs Review Labs Reviewed  URINALYSIS, ROUTINE W REFLEX MICROSCOPIC (NOT AT [facility]) - Abnormal; Notable for the following:    APPearance HAZY (*)    Specific Gravity, Urine >1.030 (*)    Hgb urine dipstick MODERATE (*)    Bilirubin Urine SMALL (*)    Urobilinogen, UA 2.0 (*)    Leukocytes, UA MODERATE (*)    All other components within normal limits  URINE MICROSCOPIC-ADD ON - Abnormal; Notable for the  following:    Squamous Epithelial / LPF FEW (*)    Bacteria, UA FEW (*)    All other components within normal limits  POC URINE PREG, ED    Imaging Review No results found.   EKG Interpretation None      MDM   Final diagnoses:  UTI (lower urinary tract infection)    Neg Urine preg Pt currently menstruating and declines pelvic. Urinalysis shows UTI- mod leukocytes with few bacteria (  7-10)  No CVA tenderness, fevers, N/V/D.  Medications  cephALEXin (KEFLEX) capsule 500 mg (not administered)  phenazopyridine (PYRIDIUM) tablet 200 mg (not administered)    33 y.o.Emalea Rothe's evaluation in the Emergency Department is complete. It has been determined that no acute conditions requiring further emergency intervention are present at this time. The patient/guardian have been advised of the diagnosis and plan. We have discussed signs and symptoms that warrant return to the ED, such as changes or worsening in symptoms.  Vital signs are stable at discharge. Filed Vitals:   03/05/15 1728  BP: 115/45  Pulse: 61  Temp:   Resp: 20    Patient/guardian has voiced understanding and agreed to follow-up with the PCP or specialist.     Delos Haring, PA-C 03/05/15 1743  Lacretia Leigh, MD 03/06/15 [phone]

## 2015-03-05 NOTE — ED Notes (Signed)
Pt states she has been having painful urination for the past week or so. Has her period currently but no other discharge before hand.

## 2015-11-24 ENCOUNTER — Other Ambulatory Visit: Payer: Self-pay | Admitting: Obstetrics

## 2016-08-23 ENCOUNTER — Ambulatory Visit (INDEPENDENT_AMBULATORY_CARE_PROVIDER_SITE_OTHER): Payer: Medicaid Other | Admitting: Obstetrics

## 2016-08-23 ENCOUNTER — Other Ambulatory Visit (HOSPITAL_COMMUNITY)
Admission: RE | Admit: 2016-08-23 | Discharge: 2016-08-23 | Disposition: A | Discharge status: Home / Self Care | Payer: Medicaid Other | Source: Ambulatory Visit | Attending: Obstetrics | Admitting: Obstetrics

## 2016-08-23 VITALS — BP 100/66 | HR 85 | Wt 244.6 lb

## 2016-08-23 DIAGNOSIS — Z30011 Encounter for initial prescription of contraceptive pills: Secondary | ICD-10-CM

## 2016-08-23 DIAGNOSIS — Z01419 Encounter for gynecological examination (general) (routine) without abnormal findings: Secondary | ICD-10-CM

## 2016-08-23 DIAGNOSIS — Z0000 Encounter for general adult medical examination without abnormal findings: Secondary | ICD-10-CM

## 2016-08-23 DIAGNOSIS — Z3009 Encounter for other general counseling and advice on contraception: Secondary | ICD-10-CM

## 2016-08-23 DIAGNOSIS — N939 Abnormal uterine and vaginal bleeding, unspecified: Secondary | ICD-10-CM

## 2016-08-23 DIAGNOSIS — Z124 Encounter for screening for malignant neoplasm of cervix: Secondary | ICD-10-CM

## 2016-08-23 DIAGNOSIS — Z1151 Encounter for screening for human papillomavirus (HPV): Secondary | ICD-10-CM | POA: Insufficient documentation

## 2016-08-23 MED ORDER — LO LOESTRIN FE 1 MG-10 MCG / 10 MCG PO TABS: 1.0000 | ORAL_TABLET | Freq: Every day | ORAL | 4 refills | Status: DC

## 2016-08-23 NOTE — Progress Notes (Signed)
Subjective:        Alicia [name] is a 35 y.o. female here for a routine exam.  Current complaints: Heavy periods with clots over past few cycles.  Thin, grey vaginal discharge without odor, but is  or irritated at times.  No vaginal itching.   Personal health questionnaire:  Is patient Alicia [name], have a family history of breast and/or ovarian cancer: no Is there a family history of uterine cancer diagnosed at age < 24, gastrointestinal cancer, urinary tract cancer, family member who is a Field seismologist syndrome-associated carrier: no Is the patient overweight and hypertensive, family history of diabetes, personal history of gestational diabetes, preeclampsia or PCOS: no Is patient over 74, have PCOS,  family history of premature CHD under age 41, diabetes, smoke, have hypertension or peripheral artery disease:  no At any time, has a partner hit, kicked or otherwise hurt or frightened you?: no Over the past 2 weeks, have you felt down, depressed or hopeless?: no Over the past 2 weeks, have you felt little interest or pleasure in doing things?:no   Gynecologic History Patient's last menstrual period was 07/11/2016. Contraception: none Last Pap: unknown. Results were: normal Last mammogram: n/a. Results were: n/a  Obstetric History OB History  Gravida Para Term Preterm AB Living  3 3 2 1   3   SAB TAB Ectopic Multiple Live Births          1    # Outcome Date GA Lbr Len/2nd Weight Sex Delivery Anes PTL Lv  3 Preterm 01/27/13 [redacted]w[redacted]d / 00:46 7 lb 2.8 oz (3.255 kg) M CS-LTranv EPI  LIV  2 Term 08/26/06 [redacted]w[redacted]d  7 lb 3 oz (3.26 kg) M VBAC EPI    1 Term 02/04/00 [redacted]w[redacted]d  6 lb 6 oz (2.892 kg) M CS-Unspec EPI        Past Medical History:  Diagnosis Date  . Anxiety   . Bladder infection   . Complication of anesthesia    Epidural did not work w/2nd preg. labor  . Depression    No med. currently, Stopped with + UPT  . Depression 06/27/2012  . Gonorrhea   . Hemorrhoids 06/27/2012  .  Osteoarthritis 06/27/2012  . Panic disorder 06/27/2012    Past Surgical History:  Procedure Laterality Date  . BREAST MASS EXCISION     Benign  . CESAREAN SECTION     X 1 1st preg., then VBAC  . CESAREAN SECTION N/A 01/27/2013   Procedure: CESAREAN SECTION;  Surgeon: Shelly Bombard, MD;  Location: [facility];  Service: Obstetrics;  Laterality: N/A;  . MOUTH SURGERY       Current Outpatient Prescriptions:  .  ABILIFY 10 MG tablet, Take 10 mg by mouth daily., Disp: , Rfl: 1 .  bisacodyl (DULCOLAX) 10 MG suppository, Place 1 suppository (10 mg total) rectally once., Disp: 12 suppository, Rfl: 0 .  clonazePAM (KLONOPIN) 1 MG tablet, Take 1 mg by mouth 2 (two) times daily as needed for anxiety. anxiety, Disp: , Rfl:  .  ibuprofen (ADVIL,MOTRIN) 600 MG tablet, TAKE 1 TABLET BY MOUTH EVERY 6 HOURS AS NEEDED FOR PAIN, Disp: 30 tablet, Rfl: 5 .  ketorolac (TORADOL) 10 MG tablet, Take 10 mg by mouth every 8 (eight) hours as needed. for pain, Disp: , Rfl: 0 .  loratadine (CLARITIN) 10 MG tablet, Take 10 mg by mouth daily., Disp: , Rfl: 6 .  zolpidem (AMBIEN) 10 MG tablet, Take 10 mg by mouth at bedtime., Disp: , Rfl:  0 .  cephALEXin (KEFLEX) 500 MG capsule, Take 1 capsule (500 mg total) by mouth 4 (four) times daily. (Patient not taking: Reported on 08/23/2016), Disp: 28 capsule, Rfl: 0 .  DULoxetine (CYMBALTA) 60 MG capsule, Take 60 mg by mouth daily., Disp: , Rfl: 1 .  magnesium hydroxide (MILK OF MAGNESIA) 400 MG/5ML suspension, Take 30 mLs by mouth once. (Patient not taking: Reported on 08/23/2016), Disp: 360 mL, Rfl: 0 .  oxyCODONE-acetaminophen (PERCOCET/ROXICET) 5-325 MG per tablet, Take 1-2 tablets by mouth every 4 (four) hours as needed for pain. (Patient not taking: Reported on 08/23/2016), Disp: 40 tablet, Rfl: 0 .  phenazopyridine (PYRIDIUM) 200 MG tablet, Take 1 tablet (200 mg total) by mouth 3 (three) times daily. (Patient not taking: Reported on 08/23/2016), Disp: 6 tablet, Rfl: 0 .   traMADol (ULTRAM) 50 MG tablet, Take 50 mg by mouth every 12 (twelve) hours as needed. for pain, Disp: , Rfl: 1 No Known Allergies  Social History  Substance Use Topics  . Smoking status: Former Smoker    Quit date: 06/22/2005  . Smokeless tobacco: Never Used  . Alcohol use No     Comment: Every other month    Family History  Problem Relation Age of Onset  . Other Neg Hx   . Alcohol abuse Neg Hx   . Arthritis Neg Hx   . Asthma Neg Hx   . Birth defects Neg Hx   . COPD Neg Hx   . Cancer Neg Hx   . Depression Neg Hx   . Drug abuse Neg Hx   . Diabetes Neg Hx   . Early death Neg Hx   . Hearing loss Neg Hx   . Heart disease Neg Hx   . Hyperlipidemia Neg Hx   . Hypertension Neg Hx   . Kidney disease Neg Hx   . Learning disabilities Neg Hx   . Mental illness Neg Hx   . Mental retardation Neg Hx   . Miscarriages / Stillbirths Neg Hx   . Stroke Neg Hx   . Vision loss Neg Hx       Review of Systems  Constitutional: negative for fatigue and weight loss Respiratory: negative for cough and wheezing Cardiovascular: negative for chest pain, fatigue and palpitations Gastrointestinal: negative for abdominal pain and change in bowel habits Musculoskeletal:negative for myalgias Neurological: negative for gait problems and tremors Behavioral/Psych: negative for abusive relationship, depression Endocrine: negative for temperature intolerance    Genitourinary:negative for abnormal menstrual periods, genital lesions, hot flashes, sexual problems and vaginal discharge Integument/breast: negative for breast lump, breast tenderness, nipple discharge and skin lesion(s)    Objective:       BP 100/66   Pulse 85   Wt 244 lb 9.6 oz (110.9 kg)   LMP 07/11/2016   BMI 46.22 kg/m  General:   alert  Skin:   no rash or abnormalities  Lungs:   clear to auscultation bilaterally  Heart:   regular rate and rhythm, S1, S2 normal, no murmur, click, rub or gallop  Breasts:   normal without  suspicious masses, skin or nipple changes or axillary nodes  Abdomen:  normal findings: no organomegaly, soft, non-tender and no hernia  Pelvis:  External genitalia: normal general appearance Urinary system: urethral meatus normal and bladder without fullness, nontender Vaginal: normal without tenderness, induration or masses Cervix: normal appearance Adnexa: normal bimanual exam Uterus: anteverted and non-tender, normal size   Lab Review Urine pregnancy test Labs reviewed yes Radiologic studies reviewed  no  50% of 20 min visit spent on counseling and coordination of care.    Assessment:    Healthy female exam.    Contraceptive Counseling and Advice.  Wants OCP's.  AUB   Plan:    Lo Loestrin Fe Rx  Education reviewed: calcium supplements, low fat, low cholesterol diet, safe sex/STD prevention, self breast exams, smoking cessation and weight bearing exercise. Contraception: OCP (estrogen/progesterone). Follow up in: 1 year.   No orders of the defined types were placed in this encounter.  Orders Placed This Encounter  Procedures  . NuSwab Vaginitis Plus (VG+)  . HIV antibody  . Hepatitis B surface antigen  . RPR  . Hepatitis C antibody     Patient ID: Alicia [name], female   DOB: 1981-07-05, 35 y.o.   MRN: [id number] Patient ID: Alicia [name], female   DOB: 08-28-1980, 35 y.o.   MRN: [id number]

## 2016-08-24 LAB — HEPATITIS B SURFACE ANTIGEN: HEP B S AG: NEGATIVE

## 2016-08-24 LAB — HEPATITIS C ANTIBODY

## 2016-08-24 LAB — HIV ANTIBODY (ROUTINE TESTING W REFLEX): HIV SCREEN 4TH GENERATION: NONREACTIVE

## 2016-08-24 LAB — RPR: RPR Ser Ql: NONREACTIVE

## 2016-08-26 LAB — NUSWAB VAGINITIS PLUS (VG+)
Atopobium vaginae: HIGH Score — AB
BVAB 2: HIGH Score — AB
CHLAMYDIA TRACHOMATIS, NAA: NEGATIVE
Candida albicans, NAA: NEGATIVE
Candida glabrata, NAA: NEGATIVE
MEGASPHAERA 1: HIGH {score} — AB
NEISSERIA GONORRHOEAE, NAA: NEGATIVE
Trich vag by NAA: NEGATIVE

## 2016-08-28 ENCOUNTER — Other Ambulatory Visit: Payer: Self-pay | Admitting: Obstetrics

## 2016-08-28 ENCOUNTER — Telehealth: Payer: Self-pay | Admitting: *Deleted

## 2016-08-28 DIAGNOSIS — N760 Acute vaginitis: Principal | ICD-10-CM

## 2016-08-28 DIAGNOSIS — B9689 Other specified bacterial agents as the cause of diseases classified elsewhere: Secondary | ICD-10-CM

## 2016-08-28 LAB — CYTOLOGY - PAP
DIAGNOSIS: NEGATIVE
HPV: NOT DETECTED

## 2016-08-28 MED ORDER — TINIDAZOLE 500 MG PO TABS: 500.0000 mg | ORAL_TABLET | Freq: Every day | ORAL | 2 refills | Status: DC

## 2016-08-28 NOTE — Telephone Encounter (Signed)
Pt called to office for lab results. Return call to pt. Pt made aware of results and Rx was sent by provider.

## 2016-08-29 ENCOUNTER — Telehealth: Payer: Self-pay | Admitting: *Deleted

## 2016-08-29 NOTE — Telephone Encounter (Signed)
PA for Tinidazole Rx has been submitted and approved. Pharmacy made aware of approval.

## 2016-09-30 ENCOUNTER — Emergency Department (HOSPITAL_COMMUNITY)
Admission: EM | Admit: 2016-09-30 | Discharge: 2016-09-30 | Disposition: A | Discharge status: Home / Self Care | Payer: Medicaid Other | Attending: Emergency Medicine | Admitting: Emergency Medicine

## 2016-09-30 ENCOUNTER — Encounter (HOSPITAL_COMMUNITY): Payer: Self-pay | Admitting: Emergency Medicine

## 2016-09-30 DIAGNOSIS — Z79899 Other long term (current) drug therapy: Secondary | ICD-10-CM | POA: Insufficient documentation

## 2016-09-30 DIAGNOSIS — F329 Major depressive disorder, single episode, unspecified: Secondary | ICD-10-CM | POA: Diagnosis not present

## 2016-09-30 DIAGNOSIS — Z87891 Personal history of nicotine dependence: Secondary | ICD-10-CM | POA: Diagnosis not present

## 2016-09-30 DIAGNOSIS — R45851 Suicidal ideations: Secondary | ICD-10-CM

## 2016-09-30 DIAGNOSIS — R4589 Other symptoms and signs involving emotional state: Secondary | ICD-10-CM

## 2016-09-30 LAB — COMPREHENSIVE METABOLIC PANEL
ALK PHOS: 70 U/L (ref 38–126)
ALT: 11 U/L — ABNORMAL LOW (ref 14–54)
AST: 14 U/L — ABNORMAL LOW (ref 15–41)
Albumin: 3.4 g/dL — ABNORMAL LOW (ref 3.5–5.0)
Anion gap: 10 (ref 5–15)
BUN: 8 mg/dL (ref 6–20)
CALCIUM: 8.9 mg/dL (ref 8.9–10.3)
CO2: 25 mmol/L (ref 22–32)
CREATININE: 0.84 mg/dL (ref 0.44–1.00)
Chloride: 103 mmol/L (ref 101–111)
Glucose, Bld: 95 mg/dL (ref 65–99)
Potassium: 3.6 mmol/L (ref 3.5–5.1)
Sodium: 138 mmol/L (ref 135–145)
Total Bilirubin: 0.6 mg/dL (ref 0.3–1.2)
Total Protein: 7.5 g/dL (ref 6.5–8.1)

## 2016-09-30 LAB — CBC
HEMATOCRIT: 39.1 % (ref 36.0–46.0)
Hemoglobin: 12.4 g/dL (ref 12.0–15.0)
MCH: 25.7 pg — ABNORMAL LOW (ref 26.0–34.0)
MCHC: 31.7 g/dL (ref 30.0–36.0)
MCV: 81 fL (ref 78.0–100.0)
Platelets: 303 10*3/uL (ref 150–400)
RBC: 4.83 MIL/uL (ref 3.87–5.11)
RDW: 14.6 % (ref 11.5–15.5)
WBC: 8.7 10*3/uL (ref 4.0–10.5)

## 2016-09-30 LAB — SALICYLATE LEVEL

## 2016-09-30 LAB — RAPID URINE DRUG SCREEN, HOSP PERFORMED
Amphetamines: NOT DETECTED
Barbiturates: NOT DETECTED
Benzodiazepines: NOT DETECTED
Cocaine: POSITIVE — AB
OPIATES: NOT DETECTED
Tetrahydrocannabinol: POSITIVE — AB

## 2016-09-30 LAB — ACETAMINOPHEN LEVEL

## 2016-09-30 LAB — ETHANOL: Alcohol, Ethyl (B): <5 mg/dL (ref <5)

## 2016-09-30 MED ORDER — LORAZEPAM 1 MG PO TABS: 1.0000 mg | ORAL_TABLET | Freq: Three times a day (TID) | ORAL | Status: DC | PRN

## 2016-09-30 MED ORDER — LORAZEPAM 1 MG PO TABS
1.0000 mg | ORAL_TABLET | Freq: Once | ORAL | Status: AC
  Administered 2016-09-30: 1 mg via ORAL
  Filled 2016-09-30: qty 1

## 2016-09-30 MED ORDER — ACETAMINOPHEN 325 MG PO TABS: 650.0000 mg | ORAL_TABLET | ORAL | Status: DC | PRN

## 2016-09-30 NOTE — ED Notes (Signed)
Tech sitting with pt.

## 2016-09-30 NOTE — ED Notes (Signed)
OK for pt to go home per TTS. Dr Ellender Hose made aware

## 2016-09-30 NOTE — ED Notes (Signed)
Pt is supposed to be taking the following medications, pt has not had them in a couple of months:  1. Paxil 20 mg daily 2. Abilify 5 mg daily 3. Ambien 10 mg at bedtime 4. Klonopin 1 mg PRN 5. Lo Loestrin FE daily

## 2016-09-30 NOTE — BH Assessment (Signed)
Tele Assessment Note   Alicia [name] is a 36 y.o. female who presented as a voluntary admission to [facility] with complaint of passive suicidal ideation and other depressive symptoms.  Pt was referred to the ED by [facility].  She is currently not receiving any psychiatric or therapeutic services. Pt provided the histoyr.  Pt reported that she has been sad for about a year due to the death of her husband.  Today she felt particularly stressed and experienced passive suicidal ideation.  Pt endorsed the following symptoms:  Persistent sadness, poor sleep, feeling hopeless, easily stressed, passive suicidal ideation.  Pt denied any past suicide attempts, homicidal ideation, substance use concerns, or self-injury.  Pt reported that she feels a little better now that she was administered an Ativan by hospital staff.  Pt requested to go home.  Pt is currently not receiving any outpatient psychiatric services.  She previously received outpatient therapy services through [facility].  During assessment, Pt presented as alert and oriented.  She had good eye contact and was cooperative. Pt was in scrubs and appeared appropriately groomed.  Pt's mood was sad, and affect was mood-congruent.  Pt endorsed depressive symptoms (see above).  Pt's speech was normal in rate, rhythm, and volume.  Pt's thought processes were within normal range, and thought content was goal-oriented.  There was no evidence of delusion.  Pt's memory and concentration were grossly intact.  Pt's insight, judgment, and impulse control were fair.  Consulted with Starleen Arms NP who recommended referral to appropriate outpatient such as [facility] [phone]) or [facility] [phone]).  Diagnosis: Major Depressive Disorder, Moderate, Recurrent  Past Medical History:  Past Medical History:  Diagnosis Date  . Anxiety   . Bladder infection   . Complication of anesthesia    Epidural did not work w/2nd preg. labor   . Depression    No med. currently, Stopped with + UPT  . Depression 06/27/2012  . Gonorrhea   . Hemorrhoids 06/27/2012  . Osteoarthritis 06/27/2012  . Panic disorder 06/27/2012    Past Surgical History:  Procedure Laterality Date  . BREAST MASS EXCISION     Benign  . CESAREAN SECTION     X 1 1st preg., then VBAC  . CESAREAN SECTION N/A 01/27/2013   Procedure: CESAREAN SECTION;  Surgeon: Shelly Bombard, MD;  Location: [facility];  Service: Obstetrics;  Laterality: N/A;  . MOUTH SURGERY      Family History:  Family History  Problem Relation Age of Onset  . Other Neg Hx   . Alcohol abuse Neg Hx   . Arthritis Neg Hx   . Asthma Neg Hx   . Birth defects Neg Hx   . COPD Neg Hx   . Cancer Neg Hx   . Depression Neg Hx   . Drug abuse Neg Hx   . Diabetes Neg Hx   . Early death Neg Hx   . Hearing loss Neg Hx   . Heart disease Neg Hx   . Hyperlipidemia Neg Hx   . Hypertension Neg Hx   . Kidney disease Neg Hx   . Learning disabilities Neg Hx   . Mental illness Neg Hx   . Mental retardation Neg Hx   . Miscarriages / Stillbirths Neg Hx   . Stroke Neg Hx   . Vision loss Neg Hx     Social History:  reports that she quit smoking about 11 years ago. She has never used smokeless tobacco. She reports that  she does not drink alcohol or use drugs.  Additional Social History:  Alcohol / Drug Use Pain Medications: See PTA Prescriptions: see PTA Over the Counter: See PTA History of alcohol / drug use?: No history of alcohol / drug abuse  CIWA: CIWA-Ar BP: 112/58 Pulse Rate: 97 COWS:    PATIENT STRENGTHS: (choose at least two) Average or above average intelligence Capable of independent living  Allergies: No Known Allergies  Home Medications:  (Not in a hospital admission)  OB/GYN Status:  Patient's last menstrual period was 09/27/2016.  General Assessment Data Location of Assessment: [facility] ED TTS Assessment: In system Is this a Tele or Face-to-Face Assessment?: Tele  Assessment Is this an Initial Assessment or a Re-assessment for this encounter?: Initial Assessment Marital status: Widowed Living Arrangements: Children (3 children -- 16.10, 3) Can pt return to current living arrangement?: Yes Admission Status: Voluntary Is patient capable of signing voluntary admission?: Yes Referral Source: Self/Family/Friend Insurance type: [place] MCD     Crisis Care Plan Living Arrangements: Children (3 children -- 16.10, 3) Name of Psychiatrist: none currently Name of Therapist: none currently  Education Status Is patient currently in school?: No  Risk to self with the past 6 months Suicidal Ideation: No (Passive ideatin) Has patient been a risk to self within the past 6 months prior to admission? : No Suicidal Intent: No Has patient had any suicidal intent within the past 6 months prior to admission? : No Is patient at risk for suicide?: No Suicidal Plan?: No Has patient had any suicidal plan within the past 6 months prior to admission? : No Access to Means: No What has been your use of drugs/alcohol within the last 12 months?: Denied Previous Attempts/Gestures: No Intentional Self Injurious Behavior: None Family Suicide History: No Recent stressful life event(s): Loss (Comment) (Husband died about a year ago) Persecutory voices/beliefs?: No Depression: Yes Depression Symptoms: Despondent, Insomnia, Fatigue, Loss of interest in usual pleasures Substance abuse history and/or treatment for substance abuse?: No Suicide prevention information given to non-admitted patients: Not applicable  Risk to Others within the past 6 months Homicidal Ideation: No Does patient have any lifetime risk of violence toward others beyond the six months prior to admission? : No Thoughts of Harm to Others: No Current Homicidal Intent: No Current Homicidal Plan: No Access to Homicidal Means: No History of harm to others?: No Assessment of Violence: None Noted Does patient  have access to weapons?: No Criminal Charges Pending?: No Does patient have a court date: No Is patient on probation?: No  Psychosis Hallucinations: None noted Delusions: None noted  Mental Status Report Appearance/Hygiene: Unremarkable Eye Contact: Good Motor Activity: Unremarkable, Freedom of movement Speech: Unremarkable, Logical/coherent Level of Consciousness: Alert Mood: Sad Affect: Sad Anxiety Level: None Thought Processes: Coherent, Relevant Judgement: Partial Orientation: Person, Place, Time, Situation Obsessive Compulsive Thoughts/Behaviors: None  Cognitive Functioning Concentration: Good Memory: Remote Intact, Recent Intact IQ: Average Insight: Fair Impulse Control: Fair Appetite: Good Sleep: Decreased Total Hours of Sleep: 5 Vegetative Symptoms: None  ADLScreening [facility] Assessment Services) Patient's cognitive ability adequate to safely complete daily activities?: Yes Patient able to express need for assistance with ADLs?: Yes Independently performs ADLs?: Yes (appropriate for developmental age)  Prior Inpatient Therapy Prior Inpatient Therapy: No  Prior Outpatient Therapy Prior Outpatient Therapy: Yes Prior Therapy Dates: 2016 Prior Therapy Facilty/Provider(s): [facility] Reason for Treatment: Depressive symptoms Does patient have an ACCT team?: No Does patient have Intensive In-House Services?  : No Does patient have [employer]  services? : No Does patient have P4CC services?: No  ADL Screening (condition at time of admission) Patient's cognitive ability adequate to safely complete daily activities?: Yes Is the patient deaf or have difficulty hearing?: No Does the patient have difficulty seeing, even when wearing glasses/contacts?: No Does the patient have difficulty concentrating, remembering, or making decisions?: No Patient able to express need for assistance with ADLs?: Yes Does the patient have difficulty dressing or bathing?:  No Independently performs ADLs?: Yes (appropriate for developmental age) Does the patient have difficulty walking or climbing stairs?: No Weakness of Legs: None Weakness of Arms/Hands: None  Home Assistive Devices/Equipment Home Assistive Devices/Equipment: None  Therapy Consults (therapy consults require a physician order) PT Evaluation Needed: No OT Evalulation Needed: No SLP Evaluation Needed: No Abuse/Neglect Assessment (Assessment to be complete while patient is alone) Physical Abuse: Denies Verbal Abuse: Denies Sexual Abuse: Denies Exploitation of patient/patient's resources: Denies Self-Neglect: Denies Values / Beliefs Cultural Requests During Hospitalization: None Spiritual Requests During Hospitalization: None Consults Spiritual Care Consult Needed: No Social Work Consult Needed: No Regulatory affairs officer (For Healthcare) Does Patient Have a Medical Advance Directive?: No    Additional Information 1:1 In Past 12 Months?: No CIRT Risk: No Elopement Risk: No Does patient have medical clearance?: Yes     Disposition:  Disposition Initial Assessment Completed for this Encounter: Yes Disposition of Patient: Outpatient treatment Type of outpatient treatment: Adult (Recommend referral to [facility])  Cornelia Copa T Lunabelle Oatley 09/30/2016 4:25 PM

## 2016-09-30 NOTE — ED Notes (Signed)
Pt's dinner tray arrived. 

## 2016-09-30 NOTE — ED Triage Notes (Signed)
Varney Biles from [facility] stated, We got a call and we responded within 2 hours. They stated she is suicidsl and was going to take an overdose.  I ask pt. And she shook her head to yes suicidal, and stated Im very flustrated..  Pt does feel suicidal. The suicidal episode has been going on for years per pt.

## 2016-09-30 NOTE — ED Provider Notes (Signed)
[facility] DEPT Provider Note   CSN: [id number] Arrival date & time: 09/30/16  1237     History   Chief Complaint Chief Complaint  Patient presents with  . Suicidal    HPI Alicia [name] is a 36 y.o. female.  Patient c/o feeling depressed with thoughts of suicide, in the past few days. States her husband died a year ago, has 3 kids, and a lot of stress. 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 Denies any single acute/abrupt stressor or inciting event. States has hx depression, but has been off any meds for long time, and has no current therapist or psychiatrist. Patient denies any recent other health problems. Is eating/drinking although not a great appetite. Denies cp or sob. No abd pain. No recent uri symptoms. Denies alcohol or drug abuse.    The history is provided by the patient.    Past Medical History:  Diagnosis Date  . Anxiety   . Bladder infection   . Complication of anesthesia    Epidural did not work w/2nd preg. labor  . Depression    No med. currently, Stopped with + UPT  . Depression 06/27/2012  . Gonorrhea   . Hemorrhoids 06/27/2012  . Osteoarthritis 06/27/2012  . Panic disorder 06/27/2012    Patient Active Problem List   Diagnosis Date Noted  . Excessive fetal growth affecting management of mother, antepartum 01/22/2013  . GERD without esophagitis 01/22/2013    Past Surgical History:  Procedure Laterality Date  . BREAST MASS EXCISION     Benign  . CESAREAN SECTION     X 1 1st preg., then VBAC  . CESAREAN SECTION N/A 01/27/2013   Procedure: CESAREAN SECTION;  Surgeon: Shelly Bombard, MD;  Location: [facility];  Service: Obstetrics;  Laterality: N/A;  . MOUTH SURGERY      OB History    Gravida Para Term Preterm AB Living   3 3 2 1   3    SAB TAB Ectopic Multiple Live Births           1       Home Medications    Prior to Admission medications   Medication Sig Start Date End Date Taking? Authorizing Provider  bisacodyl (DULCOLAX) 10 MG suppository Place 1  suppository (10 mg total) rectally once. 01/30/13   Shelly Bombard, MD  cephALEXin (KEFLEX) 500 MG capsule Take 1 capsule (500 mg total) by mouth 4 (four) times daily. Patient not taking: Reported on 08/23/2016 03/05/15   Delos Haring, PA-C  clonazePAM (KLONOPIN) 1 MG tablet Take 1 mg by mouth 2 (two) times daily as needed for anxiety. anxiety    Historical Provider, MD  DULoxetine (CYMBALTA) 60 MG capsule Take 60 mg by mouth daily. 02/19/15   Historical Provider, MD  ibuprofen (ADVIL,MOTRIN) 600 MG tablet TAKE 1 TABLET BY MOUTH EVERY 6 HOURS AS NEEDED FOR PAIN 11/25/15   Shelly Bombard, MD  ketorolac (TORADOL) 10 MG tablet Take 10 mg by mouth every 8 (eight) hours as needed. for pain 02/27/15   Historical Provider, MD  LO LOESTRIN FE 1 MG-10 MCG / 10 MCG tablet Take 1 tablet by mouth daily. 08/23/16   Shelly Bombard, MD  loratadine (CLARITIN) 10 MG tablet Take 10 mg by mouth daily. 02/19/15   Historical Provider, MD  magnesium hydroxide (MILK OF MAGNESIA) 400 MG/5ML suspension Take 30 mLs by mouth once. Patient not taking: Reported on 08/23/2016 01/30/13   Shelly Bombard, MD  oxyCODONE-acetaminophen (PERCOCET/ROXICET) 5-325 MG  per tablet Take 1-2 tablets by mouth every 4 (four) hours as needed for pain. Patient not taking: Reported on 08/23/2016 01/30/13   Shelly Bombard, MD  phenazopyridine (PYRIDIUM) 200 MG tablet Take 1 tablet (200 mg total) by mouth 3 (three) times daily. Patient not taking: Reported on 08/23/2016 03/05/15   Delos Haring, PA-C  tinidazole (TINDAMAX) 500 MG tablet Take 1 tablet (500 mg total) by mouth daily with breakfast. 08/28/16   Shelly Bombard, MD  traMADol (ULTRAM) 50 MG tablet Take 50 mg by mouth every 12 (twelve) hours as needed. for pain 12/31/14   Historical Provider, MD  zolpidem (AMBIEN) 10 MG tablet Take 10 mg by mouth at bedtime. 02/27/15   Historical Provider, MD    Family History Family History  Problem Relation Age of Onset  . Other Neg Hx   . Alcohol abuse  Neg Hx   . Arthritis Neg Hx   . Asthma Neg Hx   . Birth defects Neg Hx   . COPD Neg Hx   . Cancer Neg Hx   . Depression Neg Hx   . Drug abuse Neg Hx   . Diabetes Neg Hx   . Early death Neg Hx   . Hearing loss Neg Hx   . Heart disease Neg Hx   . Hyperlipidemia Neg Hx   . Hypertension Neg Hx   . Kidney disease Neg Hx   . Learning disabilities Neg Hx   . Mental illness Neg Hx   . Mental retardation Neg Hx   . Miscarriages / Stillbirths Neg Hx   . Stroke Neg Hx   . Vision loss Neg Hx     Social History Social History  Substance Use Topics  . Smoking status: Former Smoker    Quit date: 06/22/2005  . Smokeless tobacco: Never Used  . Alcohol use No     Comment: Every other month     Allergies   Patient has no known allergies.   Review of Systems Review of Systems  Constitutional: Negative for fever.  HENT: Negative for sore throat.   Eyes: Negative for redness.  Respiratory: Negative for shortness of breath.   Cardiovascular: Negative for chest pain.  Gastrointestinal: Negative for abdominal pain.  Genitourinary: Negative for flank pain.  Musculoskeletal: Negative for back pain and neck pain.  Skin: Negative for rash.  Neurological: Negative for headaches.  Hematological: Does not bruise/bleed easily.  Psychiatric/Behavioral: Positive for dysphoric mood.     Physical Exam Updated Vital Signs BP 112/58 (BP Location: Right Arm)   Pulse 97   Temp 98.6 F (37 C) (Oral)   Resp 17   Ht 5\' 2"  (1.575 m)   Wt 110.7 kg   LMP 09/27/2016   SpO2 99%   BMI 44.63 kg/m   Physical Exam  Constitutional: She appears well-developed and well-nourished. No distress.  HENT:  Mouth/Throat: Oropharynx is clear and moist.  Eyes: Conjunctivae are normal. No scleral icterus.  Neck: Neck supple. No tracheal deviation present.  Cardiovascular: Normal rate, regular rhythm, normal heart sounds and intact distal pulses.   Pulmonary/Chest: Effort normal and breath sounds normal. No  respiratory distress.  Abdominal: Soft. Normal appearance. She exhibits no distension. There is no tenderness.  Musculoskeletal: She exhibits no edema.  Neurological: She is alert.  Steady gait.   Skin: Skin is warm and dry. No rash noted. She is not diaphoretic.  Psychiatric:  Depressed mood, flat affect. +suicidal thoughts, denies specific plan.   Nursing note and  vitals reviewed.    ED Treatments / Results  Labs (all labs ordered are listed, but only abnormal results are displayed) Results for orders placed or performed during the hospital encounter of 09/30/16  CBC  Result Value Ref Range   WBC 8.7 4.0 - 10.5 K/uL   RBC 4.83 3.87 - 5.11 MIL/uL   Hemoglobin 12.4 12.0 - 15.0 g/dL   HCT 39.1 36.0 - 46.0 %   MCV 81.0 78.0 - 100.0 fL   MCH 25.7 (L) 26.0 - 34.0 pg   MCHC 31.7 30.0 - 36.0 g/dL   RDW 14.6 11.5 - 15.5 %   Platelets 303 150 - 400 K/uL  Comprehensive metabolic panel  Result Value Ref Range   Sodium 138 135 - 145 mmol/L   Potassium 3.6 3.5 - 5.1 mmol/L   Chloride 103 101 - 111 mmol/L   CO2 25 22 - 32 mmol/L   Glucose, Bld 95 65 - 99 mg/dL   BUN 8 6 - 20 mg/dL   Creatinine, Ser 0.84 0.44 - 1.00 mg/dL   Calcium 8.9 8.9 - 10.3 mg/dL   Total Protein 7.5 6.5 - 8.1 g/dL   Albumin 3.4 (L) 3.5 - 5.0 g/dL   AST 14 (L) 15 - 41 U/L   ALT 11 (L) 14 - 54 U/L   Alkaline Phosphatase 70 38 - 126 U/L   Total Bilirubin 0.6 0.3 - 1.2 mg/dL   GFR calc non Af Amer >60 >60 mL/min   GFR calc Af Amer >60 >60 mL/min   Anion gap 10 5 - 15   EKG  EKG Interpretation None       Radiology No results found.  Procedures Procedures (including critical care time)  Medications Ordered in ED Medications  LORazepam (ATIVAN) tablet 1 mg (1 mg Oral Given 09/30/16 1407)     Initial Impression / Assessment and Plan / ED Course  I have reviewed the triage vital signs and the nursing notes.  Pertinent labs & imaging results that were available during my care of the patient were  reviewed by me and considered in my medical decision making (see chart for details).  Reviewed nursing notes and prior charts for additional history.   Labs sent.  [facility] team consulted to assess.   Recheck, pt alert, content, no acute distress.  BH eval pending.  Disposition per [facility] team.     Final Clinical Impressions(s) / ED Diagnoses   Final diagnoses:  None    New Prescriptions New Prescriptions   No medications on file     Lajean Saver, MD 09/30/16 [phone]

## 2016-09-30 NOTE — ED Triage Notes (Signed)
Called charge nurse, staffing office, pt. Undressed to scrubs, security wanded pt.

## 2016-09-30 NOTE — ED Provider Notes (Signed)
Assumed care of the patient. Patient has been cleared for discharge by [facility]. She has chronic depression and suicidality that is not acutely changed, and they do not feel patient requires inpatient treatment. I provided pt with resources for outpt management. Denies SI, HI, AVH at this time. Lab work is unremarkable. Will d/c home. Medically stable on my repeat assessment.   Duffy Bruce, MD 09/30/16 [phone]

## 2016-09-30 NOTE — ED Notes (Signed)
Pt asked about what was going to happen next. Pt stated, "I need to get home because my Dad has to work tomorrow and there is no one to be with my kids". Pt stated that one child is disabled. Informed Varney Biles - RN.

## 2016-09-30 NOTE — ED Notes (Signed)
Pt. Verbalized , if yall don't give me something or a room Im going to have an anxiety attack. Called Dr. Dayna Barker, ordered Ativan 1 mg. And can repeat in 30 minutes.

## 2016-11-01 ENCOUNTER — Emergency Department (HOSPITAL_COMMUNITY)
Admission: EM | Admit: 2016-11-01 | Discharge: 2016-11-01 | Disposition: A | Discharge status: Home / Self Care | Payer: Medicaid Other | Attending: Emergency Medicine | Admitting: Emergency Medicine

## 2016-11-01 ENCOUNTER — Encounter (HOSPITAL_COMMUNITY): Payer: Self-pay

## 2016-11-01 DIAGNOSIS — Z79899 Other long term (current) drug therapy: Secondary | ICD-10-CM | POA: Diagnosis not present

## 2016-11-01 DIAGNOSIS — M79644 Pain in right finger(s): Secondary | ICD-10-CM | POA: Diagnosis present

## 2016-11-01 DIAGNOSIS — F17200 Nicotine dependence, unspecified, uncomplicated: Secondary | ICD-10-CM | POA: Insufficient documentation

## 2016-11-01 MED ORDER — LIDOCAINE HCL (PF) 1 % IJ SOLN
2.0000 mL | Freq: Once | INTRAMUSCULAR | Status: AC
Start: 2016-11-01 — End: 2016-11-01
  Administered 2016-11-01: 2 mL

## 2016-11-01 MED ORDER — LIDOCAINE HCL (CARDIAC) 20 MG/ML IV SOLN
INTRAVENOUS | Status: AC
  Filled 2016-11-01: qty 5

## 2016-11-01 MED ORDER — LIDOCAINE HCL (PF) 1 % IJ SOLN
INTRAMUSCULAR | Status: AC
  Filled 2016-11-01: qty 5

## 2016-11-01 NOTE — ED Notes (Signed)
Patient has gel nail on right 5th finger that "won't come off". Patient states she soaked it in nail polish remover and it did not come off like the other fake nails. Patient states she is having a lot of pain with it. Denies drainage. Nail does not look like it is ingrown. Nail bed rough.

## 2016-11-01 NOTE — ED Provider Notes (Signed)
[facility] DEPT Provider Note   CSN: [id number] Arrival date & time: 11/01/16  4854  By signing my name below, I, Higinio Plan, attest that this documentation has been prepared under the direction and in the presence of Josh Truxton Stupka PA-C . Electronically Signed: Higinio Plan, Scribe. 11/01/2016. 9:17 AM.  History   Chief Complaint Chief Complaint  Patient presents with  . Hand Pain   The history is provided by the patient. No language interpreter was used.   HPI Comments: Alicia [name] is a 36 y.o. female with PMHx of anxiety, who presents to the Emergency Department complaining of gradually worsening, right hand pain that began 2 days ago. Pt reports she has an artificial gel nail on her right fifth digit that is painful to remove. She states the nail is "pulling on her real nail" and is affecting her ability to use her right hand. She notes she has attempted to soak her nail in acetone solution and applied a band-aid with no success. Pt denies any other complaints.    Past Medical History:  Diagnosis Date  . Anxiety   . Bladder infection   . Complication of anesthesia    Epidural did not work w/2nd preg. labor  . Depression    No med. currently, Stopped with + UPT  . Depression 06/27/2012  . Gonorrhea   . Hemorrhoids 06/27/2012  . Osteoarthritis 06/27/2012  . Panic disorder 06/27/2012    Patient Active Problem List   Diagnosis Date Noted  . Excessive fetal growth affecting management of mother, antepartum 01/22/2013  . GERD without esophagitis 01/22/2013    Past Surgical History:  Procedure Laterality Date  . BREAST MASS EXCISION     Benign  . CESAREAN SECTION     X 1 1st preg., then VBAC  . CESAREAN SECTION N/A 01/27/2013   Procedure: CESAREAN SECTION;  Surgeon: Shelly Bombard, MD;  Location: [facility];  Service: Obstetrics;  Laterality: N/A;  . MOUTH SURGERY      OB History    Gravida Para Term Preterm AB Living   3 3 2 1   3    SAB TAB Ectopic Multiple Live Births             1     Home Medications    Prior to Admission medications   Medication Sig Start Date End Date Taking? Authorizing Provider  bisacodyl (DULCOLAX) 10 MG suppository Place 1 suppository (10 mg total) rectally once. Patient not taking: Reported on 09/30/2016 01/30/13   Shelly Bombard, MD  cephALEXin (KEFLEX) 500 MG capsule Take 1 capsule (500 mg total) by mouth 4 (four) times daily. Patient not taking: Reported on 08/23/2016 03/05/15   Delos Haring, PA-C  clonazePAM (KLONOPIN) 1 MG tablet Take 1 mg by mouth 2 (two) times daily as needed for anxiety.     Historical Provider, MD  ibuprofen (ADVIL,MOTRIN) 600 MG tablet TAKE 1 TABLET BY MOUTH EVERY 6 HOURS AS NEEDED FOR PAIN 11/25/15   Shelly Bombard, MD  LO LOESTRIN FE 1 MG-10 MCG / 10 MCG tablet Take 1 tablet by mouth daily. 08/23/16   Shelly Bombard, MD  loratadine (CLARITIN) 10 MG tablet Take 10 mg by mouth daily. 02/19/15   Historical Provider, MD  magnesium hydroxide (MILK OF MAGNESIA) 400 MG/5ML suspension Take 30 mLs by mouth once. Patient not taking: Reported on 08/23/2016 01/30/13   Shelly Bombard, MD  oxyCODONE-acetaminophen (PERCOCET/ROXICET) 5-325 MG per tablet Take 1-2 tablets by mouth every 4 (four)  hours as needed for pain. Patient not taking: Reported on 08/23/2016 01/30/13   Shelly Bombard, MD  phenazopyridine (PYRIDIUM) 200 MG tablet Take 1 tablet (200 mg total) by mouth 3 (three) times daily. Patient not taking: Reported on 08/23/2016 03/05/15   Delos Haring, PA-C  tinidazole (TINDAMAX) 500 MG tablet Take 1 tablet (500 mg total) by mouth daily with breakfast. Patient not taking: Reported on 09/30/2016 08/28/16   Shelly Bombard, MD    Family History Family History  Problem Relation Age of Onset  . Other Neg Hx   . Alcohol abuse Neg Hx   . Arthritis Neg Hx   . Asthma Neg Hx   . Birth defects Neg Hx   . COPD Neg Hx   . Cancer Neg Hx   . Depression Neg Hx   . Drug abuse Neg Hx   . Diabetes Neg Hx   . Early death  Neg Hx   . Hearing loss Neg Hx   . Heart disease Neg Hx   . Hyperlipidemia Neg Hx   . Hypertension Neg Hx   . Kidney disease Neg Hx   . Learning disabilities Neg Hx   . Mental illness Neg Hx   . Mental retardation Neg Hx   . Miscarriages / Stillbirths Neg Hx   . Stroke Neg Hx   . Vision loss Neg Hx     Social History Social History  Substance Use Topics  . Smoking status: Current Every Day Smoker    Packs/day: 1.00    Last attempt to quit: 06/22/2005  . Smokeless tobacco: Never Used  . Alcohol use No     Comment: Every other month   Allergies   Patient has no known allergies.  Review of Systems Review of Systems  Constitutional: Negative for fever.  Musculoskeletal: Positive for arthralgias.  Skin: Negative for wound.   Physical Exam Updated Vital Signs BP 107/78 (BP Location: Left Arm)   Pulse 90   Temp 98.2 F (36.8 C) (Oral)   Resp 18   Ht 5\' 2"  (1.575 m)   Wt 245 lb (111.1 kg)   SpO2 100%   BMI 44.81 kg/m   Physical Exam  Constitutional: She is oriented to person, place, and time. She appears well-developed and well-nourished.  HENT:  Head: Normocephalic.  Eyes: EOM are normal.  Neck: Normal range of motion.  Pulmonary/Chest: Effort normal.  Abdominal: She exhibits no distension.  Musculoskeletal: Normal range of motion.  On the tip of the right 5th digit nail, there is an affixed artifical nail. Exquisite tenderness with movement of the nail and tip of finger. No drainage.  Neurological: She is alert and oriented to person, place, and time.  Psychiatric: She has a normal mood and affect.  Nursing note and vitals reviewed.  ED Treatments / Results  DIAGNOSTIC STUDIES:  Oxygen Saturation is 100% on RA, normal by my interpretation.    COORDINATION OF CARE:  9:13 AM Discussed treatment plan with pt at bedside and pt agreed to plan.  9:36 AM Digital block performed Technique: 3-sided ring block Finger: R 5th Area prepped with alcohol wipe and  iodine Medication: 33mL of 1% lidocaine without epinephrine  Patient tolerated procedure well. Adequate anesthesia achieved.   Procedures Procedures (including critical care time)  Medications Ordered in ED Medications - No data to display  Initial Impression / Assessment and Plan / ED Course  I have reviewed the triage vital signs and the nursing notes.  Pertinent labs &  imaging results that were available during my care of the patient were reviewed by me and considered in my medical decision making (see chart for details).     Patient seen and examined. Discussed possible treatment plans including digital block with removal, just removing the nail here, having her continue to soak the nail until it releases at home. Patient like to have a digital block performed.   Vital signs reviewed and are as follows: BP 107/78 (BP Location: Left Arm)   Pulse 90   Temp 98.2 F (36.8 C) (Oral)   Resp 18   Ht 5\' 2"  (1.575 m)   Wt 111.1 kg   SpO2 100%   BMI 44.81 kg/m   Nail was removed with a curved hemostat. Patient does have a small amount of granulomatous tissue at the tip of the nail which is not bleeding. This is what is likely causing the patient to have increased pain. Patient counseled on wound care, signs and symptoms top return.   I personally performed the services described in this documentation, which was scribed in my presence. The recorded information has been reviewed and is accurate.   Final Clinical Impressions(s) / ED Diagnoses   Final diagnoses:  Pain of finger of right hand   Artificial nail, removed without complication.  New Prescriptions Current Discharge Medication List       Carlisle Cater, PA-C 11/01/16 [place], MD 11/02/16 [phone]

## 2016-11-01 NOTE — ED Triage Notes (Signed)
Per Pt, Pt has a artificial finger nail on the right pinky that is painful to remove.

## 2016-11-01 NOTE — Discharge Instructions (Signed)
Please read and follow all provided instructions.  Your diagnoses today include:  1. Pain of finger of right hand     Tests performed today include:  Vital signs. See below for your results today.   Medications prescribed:   None  Take any prescribed medications only as directed.   Home care instructions:  Follow any educational materials contained in this packet. Keep affected area above the level of your heart when possible. Wash area gently twice a day with warm soapy water. Do not apply alcohol or hydrogen peroxide. Cover the area if it draining or weeping.   Follow-up instructions: Return to the Emergency Department if your symptoms get worse.   Return instructions:  Return to the Emergency Department if you have:  Fever  Worsening symptoms  Worsening pain  Worsening swelling  Redness of the skin that moves away from the affected area, especially if it streaks away from the affected area   Any other emergent concerns  Your vital signs today were: BP 107/78 (BP Location: Left Arm)    Pulse 90    Temp 98.2 F (36.8 C) (Oral)    Resp 18    Ht 5\' 2"  (1.575 m)    Wt 111.1 kg    SpO2 100%    BMI 44.81 kg/m  If your blood pressure (BP) was elevated above 135/85 this visit, please have this repeated by your doctor within one month. --------------

## 2016-12-27 ENCOUNTER — Emergency Department (HOSPITAL_COMMUNITY)
Admission: EM | Admit: 2016-12-27 | Discharge: 2016-12-27 | Disposition: A | Discharge status: Home / Self Care | Payer: Medicaid Other | Attending: Emergency Medicine | Admitting: Emergency Medicine

## 2016-12-27 ENCOUNTER — Encounter (HOSPITAL_COMMUNITY): Payer: Self-pay | Admitting: Emergency Medicine

## 2016-12-27 DIAGNOSIS — J069 Acute upper respiratory infection, unspecified: Secondary | ICD-10-CM

## 2016-12-27 DIAGNOSIS — F17200 Nicotine dependence, unspecified, uncomplicated: Secondary | ICD-10-CM | POA: Insufficient documentation

## 2016-12-27 DIAGNOSIS — Z79899 Other long term (current) drug therapy: Secondary | ICD-10-CM | POA: Insufficient documentation

## 2016-12-27 DIAGNOSIS — J029 Acute pharyngitis, unspecified: Secondary | ICD-10-CM | POA: Diagnosis present

## 2016-12-27 MED ORDER — LORATADINE 10 MG PO TABS: 10.0000 mg | ORAL_TABLET | Freq: Every day | ORAL | 0 refills | Status: DC

## 2016-12-27 MED ORDER — FLUTICASONE PROPIONATE 50 MCG/ACT NA SUSP: 1.0000 | Freq: Every day | NASAL | 2 refills | Status: DC

## 2016-12-27 NOTE — ED Provider Notes (Signed)
[facility] DEPT Provider Note   CSN: [id number] Arrival date & time: 12/27/16  0925  By signing my name below, I, Alicia [name], attest that this documentation has been prepared under the direction and in the presence of [employer], PA-C.  Electronically Signed: Reola [name], ED Scribe. 12/27/16. 10:36 AM.  History   Chief Complaint Chief Complaint  Patient presents with  . Influenza   The history is provided by the patient. No language interpreter was used.    HPI Comments:  Alicia [name] is a 36 y.o. female who presents to the Emergency Department complaining of sore throat beginning last night. She notes associated subjective fever, chills, generalized myalgias, mild productive cough w/ green sputum, congestion, and sinus pressure. 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 Per pt, one of her children has recently been sick with URI-like symptoms. Pt also notes that she does have a h/o seasonal allergies, however, she does not believe that her symptoms are consistent with this. No h/o DM. She denies shortness of breath, or any other associated symptoms.   PCP: none  Past Medical History:  Diagnosis Date  . Anxiety   . Bladder infection   . Complication of anesthesia    Epidural did not work w/2nd preg. labor  . Depression    No med. currently, Stopped with + UPT  . Depression 06/27/2012  . Gonorrhea   . Hemorrhoids 06/27/2012  . Osteoarthritis 06/27/2012  . Panic disorder 06/27/2012   Patient Active Problem List   Diagnosis Date Noted  . Excessive fetal growth affecting management of mother, antepartum 01/22/2013  . GERD without esophagitis 01/22/2013   Past Surgical History:  Procedure Laterality Date  . BREAST MASS EXCISION     Benign  . CESAREAN SECTION     X 1 1st preg., then VBAC  . CESAREAN SECTION N/A 01/27/2013   Procedure: CESAREAN SECTION;  Surgeon: Shelly Bombard, MD;  Location: [facility];  Service:  Obstetrics;  Laterality: N/A;  . MOUTH SURGERY     OB History    Gravida Para Term Preterm AB Living   3 3 2 1   3    SAB TAB Ectopic Multiple Live Births           1     Home Medications    Prior to Admission medications   Medication Sig Start Date End Date Taking? Authorizing Provider  clonazePAM (KLONOPIN) 1 MG tablet Take 1 mg by mouth 2 (two) times daily as needed for anxiety.     Historical Provider, MD  fluticasone (FLONASE) 50 MCG/ACT nasal spray Place 1 spray into both nostrils daily. 12/27/16   Okey Regal, PA-C  ibuprofen (ADVIL,MOTRIN) 600 MG tablet TAKE 1 TABLET BY MOUTH EVERY 6 HOURS AS NEEDED FOR PAIN 11/25/15   Shelly Bombard, MD  LO LOESTRIN FE 1 MG-10 MCG / 10 MCG tablet Take 1 tablet by mouth daily. 08/23/16   Shelly Bombard, MD  loratadine (CLARITIN) 10 MG tablet Take 1 tablet (10 mg total) by mouth daily. 12/27/16   Okey Regal, PA-C   Family History Family History  Problem Relation Age of Onset  . Other Neg Hx   . Alcohol abuse Neg Hx   . Arthritis Neg Hx   . Asthma Neg Hx   . Birth defects Neg Hx   . COPD Neg Hx   . Cancer Neg Hx   . Depression Neg Hx   . Drug abuse Neg  Hx   . Diabetes Neg Hx   . Early death Neg Hx   . Hearing loss Neg Hx   . Heart disease Neg Hx   . Hyperlipidemia Neg Hx   . Hypertension Neg Hx   . Kidney disease Neg Hx   . Learning disabilities Neg Hx   . Mental illness Neg Hx   . Mental retardation Neg Hx   . Miscarriages / Stillbirths Neg Hx   . Stroke Neg Hx   . Vision loss Neg Hx    Social History Social History  Substance Use Topics  . Smoking status: Current Every Day Smoker    Packs/day: 1.00    Last attempt to quit: 06/22/2005  . Smokeless tobacco: Never Used  . Alcohol use No     Comment: Every other month   Allergies   Patient has no known allergies.  Review of Systems Review of Systems  Constitutional: Positive for chills and fever (subjective).  HENT: Positive for congestion, sinus pressure and  sore throat.   Respiratory: Positive for cough. Negative for shortness of breath.   Musculoskeletal: Positive for myalgias.  Allergic/Immunologic: Positive for environmental allergies.  All other systems reviewed and are negative.  Physical Exam Updated Vital Signs BP 116/86 (BP Location: Right Arm)   Pulse 82   Temp 99.2 F (37.3 C) (Oral)   Resp 18   SpO2 99%   Physical Exam  Constitutional: She appears well-developed and well-nourished. No distress.  HENT:  Head: Normocephalic and atraumatic.  B/l tonsillar hypertrophy, no erythema noted. No exudates. Uvula is midline and rises with phonation. No signs of PTA or RPA.   Eyes: Conjunctivae are normal.  Neck: Normal range of motion.  Cardiovascular: Normal rate, regular rhythm and normal heart sounds.   No murmur heard. Pulmonary/Chest: Effort normal and breath sounds normal. No respiratory distress. She has no wheezes. She has no rales.  Abdominal: She exhibits no distension.  Musculoskeletal: Normal range of motion.  Neurological: She is alert.  Skin: No pallor.  Psychiatric: She has a normal mood and affect. Her behavior is normal.  Nursing note and vitals reviewed.  ED Treatments / Results  DIAGNOSTIC STUDIES: Oxygen Saturation is 99% on RA, normal by my interpretation.   COORDINATION OF CARE: 10:36 AM-Discussed next steps with pt. Pt verbalized understanding and is agreeable with the plan.   Labs (all labs ordered are listed, but only abnormal results are displayed) Labs Reviewed - No data to display  EKG  EKG Interpretation None      Radiology No results found.  Procedures Procedures   Medications Ordered in ED Medications - No data to display  Initial Impression / Assessment and Plan / ED Course  I have reviewed the triage vital signs and the nursing notes.  Pertinent labs & imaging results that were available during my care of the patient were reviewed by me and considered in my medical decision  making (see chart for details).     Discharge Meds: Flonase and Claritin   Assessment/Plan: 36 year old female presents today with likely viral URI.  She has no signs of acute bacterial infection including strep pharyngitis.  She is very well-appearing in no acute distress.  She is afebrile nontoxic.  She will be discharged home with symptomatic care instructions and strict return precautions. Pt verbalized understanding and agreement to today's plan had no further questions or concerns at the time of discharge.   Final Clinical Impressions(s) / ED Diagnoses   Final diagnoses:  Viral URI   New Prescriptions Discharge Medication List as of 12/27/2016 10:54 AM    START taking these medications   Details  fluticasone (FLONASE) 50 MCG/ACT nasal spray Place 1 spray into both nostrils daily., Starting Thu 12/27/2016, Print       I personally performed the services described in this documentation, which was scribed in my presence. The recorded information has been reviewed and is accurate.    Okey Regal, PA-C 12/27/16 1227    Virgel Manifold, MD 12/31/16 [phone]

## 2016-12-27 NOTE — Discharge Instructions (Signed)
Please read attached information. If you experience any new or worsening signs or symptoms please return to the emergency room for evaluation. Please follow-up with your primary care provider or specialist as discussed. Please use medication prescribed only as directed and discontinue taking if you have any concerning signs or symptoms.   °

## 2016-12-27 NOTE — ED Triage Notes (Signed)
Pt reports generalized body aches, sore throat, cough and chills since yesterday.

## 2017-03-10 ENCOUNTER — Emergency Department (HOSPITAL_COMMUNITY)
Admission: EM | Admit: 2017-03-10 | Discharge: 2017-03-11 | Disposition: A | Discharge status: Home / Self Care | Payer: Medicaid Other | Attending: Emergency Medicine | Admitting: Emergency Medicine

## 2017-03-10 ENCOUNTER — Encounter (HOSPITAL_COMMUNITY): Payer: Self-pay

## 2017-03-10 DIAGNOSIS — R140 Abdominal distension (gaseous): Secondary | ICD-10-CM | POA: Diagnosis not present

## 2017-03-10 DIAGNOSIS — F419 Anxiety disorder, unspecified: Secondary | ICD-10-CM | POA: Insufficient documentation

## 2017-03-10 DIAGNOSIS — F329 Major depressive disorder, single episode, unspecified: Secondary | ICD-10-CM | POA: Diagnosis not present

## 2017-03-10 DIAGNOSIS — Z79899 Other long term (current) drug therapy: Secondary | ICD-10-CM | POA: Insufficient documentation

## 2017-03-10 DIAGNOSIS — R2243 Localized swelling, mass and lump, lower limb, bilateral: Secondary | ICD-10-CM | POA: Diagnosis present

## 2017-03-10 DIAGNOSIS — F17200 Nicotine dependence, unspecified, uncomplicated: Secondary | ICD-10-CM | POA: Insufficient documentation

## 2017-03-10 MED ORDER — DIPHENHYDRAMINE HCL 25 MG PO CAPS
25.0000 mg | ORAL_CAPSULE | Freq: Once | ORAL | Status: AC
  Administered 2017-03-11: 25 mg via ORAL
  Filled 2017-03-10: qty 1

## 2017-03-10 NOTE — ED Provider Notes (Signed)
[facility] DEPT Provider Note   CSN: [id number] Arrival date & time: 03/10/17  2243   By signing my name below, I, Eunice Blase, attest that this documentation has been prepared under the direction and in the presence of Jaquon Gingerich, Barbette Hair, MD. Electronically signed, Eunice Blase, ED Scribe. 03/10/17. 11:52 PM.  History   Chief Complaint Chief Complaint  Patient presents with  . Bloated  . Leg Swelling   The history is provided by the patient and medical records. No language interpreter was used.    Saydie Gerdts is a 36 y.o. female with h/o anxiety BIB EMS to the Emergency Department concerning generalized swelling she noticed this evening after taking a bath. She states her abdomen, bilateral thighs and breasts are swollen. Pt states her face was swollen when EMS arrived, but this has subsided on evaluation. She also notes urinary urgency, stating she has difficulty maintaining a urinary stream. Chronic back pain also noted. Denies any weakness, numbness, tingling in lower extremities. Reports no new exposures to soaps or detergents. No new ingestions. Pt reportedly taking medications for bipolar and depression. Tobacco use noted. No urinary sx's to note. No new cosmetic or hygenic product exposures. No SOB, abdominal pain, N/V, fevers, numbness weakness or tingling in the legs, and no rash. No other complaints at this time.   Past Medical History:  Diagnosis Date  . Anxiety   . Bladder infection   . Complication of anesthesia    Epidural did not work w/2nd preg. labor  . Depression    No med. currently, Stopped with + UPT  . Depression 06/27/2012  . Gonorrhea   . Hemorrhoids 06/27/2012  . Osteoarthritis 06/27/2012  . Panic disorder 06/27/2012    Patient Active Problem List   Diagnosis Date Noted  . Excessive fetal growth affecting management of mother, antepartum 01/22/2013  . GERD without esophagitis 01/22/2013    Past Surgical History:  Procedure Laterality Date   . BREAST MASS EXCISION     Benign  . CESAREAN SECTION     X 1 1st preg., then VBAC  . CESAREAN SECTION N/A 01/27/2013   Procedure: CESAREAN SECTION;  Surgeon: Shelly Bombard, MD;  Location: [facility];  Service: Obstetrics;  Laterality: N/A;  . MOUTH SURGERY      OB History    Gravida Para Term Preterm AB Living   3 3 2 1   3    SAB TAB Ectopic Multiple Live Births           1       Home Medications    Prior to Admission medications   Medication Sig Start Date End Date Taking? Authorizing Provider  clonazePAM (KLONOPIN) 1 MG tablet Take 1 mg by mouth 2 (two) times daily as needed for anxiety.     [provider]  fluticasone (FLONASE) 50 MCG/ACT nasal spray Place 1 spray into both nostrils daily. 12/27/16   Hedges, Dellis Filbert, PA-C  ibuprofen (ADVIL,MOTRIN) 600 MG tablet TAKE 1 TABLET BY MOUTH EVERY 6 HOURS AS NEEDED FOR PAIN 11/25/15   Shelly Bombard, MD  LO LOESTRIN FE 1 MG-10 MCG / 10 MCG tablet Take 1 tablet by mouth daily. 08/23/16   Shelly Bombard, MD  loratadine (CLARITIN) 10 MG tablet Take 1 tablet (10 mg total) by mouth daily. 12/27/16   Okey Regal, PA-C    Family History Family History  Problem Relation Age of Onset  . Other Neg Hx   . Alcohol abuse Neg Hx   .  Arthritis Neg Hx   . Asthma Neg Hx   . Birth defects Neg Hx   . COPD Neg Hx   . Cancer Neg Hx   . Depression Neg Hx   . Drug abuse Neg Hx   . Diabetes Neg Hx   . Early death Neg Hx   . Hearing loss Neg Hx   . Heart disease Neg Hx   . Hyperlipidemia Neg Hx   . Hypertension Neg Hx   . Kidney disease Neg Hx   . Learning disabilities Neg Hx   . Mental illness Neg Hx   . Mental retardation Neg Hx   . Miscarriages / Stillbirths Neg Hx   . Stroke Neg Hx   . Vision loss Neg Hx     Social History Social History  Substance Use Topics  . Smoking status: Current Every Day Smoker    Packs/day: 1.00    Last attempt to quit: 06/22/2005  . Smokeless tobacco: Never Used  . Alcohol use No      Comment: Every other month     Allergies   Patient has no known allergies.   Review of Systems Review of Systems  Constitutional: Negative for appetite change and fever.  HENT: Negative for facial swelling (none currently).   Respiratory: Negative for shortness of breath.   Cardiovascular: Positive for leg swelling.  Gastrointestinal: Positive for abdominal distention. Negative for abdominal pain, nausea and vomiting.  Genitourinary: Positive for urgency.  Skin: Negative for rash.  Neurological: Negative for weakness and numbness.  All other systems reviewed and are negative.    Physical Exam Updated Vital Signs BP (!) 110/39   Pulse 77   Temp 98 F (36.7 C) (Oral)   Resp 18   Ht 5\' 1"  (1.549 m)   Wt 239 lb (108.4 kg)   LMP 01/29/2017   SpO2 99%   BMI 45.16 kg/m   Physical Exam  Constitutional: She is oriented to person, place, and time. She appears well-developed and well-nourished.  Overweight, no acute distress  HENT:  Head: Normocephalic and atraumatic.  Mouth/Throat: Oropharynx is clear and moist.  Cardiovascular: Normal rate, regular rhythm and normal heart sounds.   No murmur heard. Pulmonary/Chest: Effort normal and breath sounds normal. No respiratory distress. She has no wheezes.  Abdominal: Soft. Bowel sounds are normal. There is no tenderness. There is no guarding.  Musculoskeletal:  No appreciable lower extremity edema  Neurological: She is alert and oriented to person, place, and time.  Skin: Skin is warm and dry. No rash noted.  Psychiatric: She has a normal mood and affect.  Nursing note and vitals reviewed.    ED Treatments / Results  DIAGNOSTIC STUDIES: Oxygen Saturation is 99% on RA, NL by my interpretation.    COORDINATION OF CARE: 11:40 PM-Discussed next steps with pt. Pt verbalized understanding and is agreeable with the plan. Will order labs.   Labs (all labs ordered are listed, but only abnormal results are displayed) Labs  Reviewed  I-STAT CHEM 8, ED - Abnormal; Notable for the following:       Result Value   Potassium 3.4 (*)    All other components within normal limits    EKG  EKG Interpretation None       Radiology No results found.  Procedures Procedures (including critical care time)  Medications Ordered in ED Medications  diphenhydrAMINE (BENADRYL) capsule 25 mg (25 mg Oral Given 03/11/17 0003)     Initial Impression / Assessment and Plan /  ED Course  I have reviewed the triage vital signs and the nursing notes.  Pertinent labs & imaging results that were available during my care of the patient were reviewed by me and considered in my medical decision making (see chart for details).     Patient presents with concerns for swelling of the legs and abdomen. No objective evidence of swelling or on exam. No other signs or symptoms of allergic reaction and rash or itching. Unclear etiology for the patient's concern. Screening chem 8 was obtained to evaluate for metabolite derangement. Patient was given Benadryl. She is able to tolerate fluids without difficulty. She states she feels better on recheck. Patient reassured.  After history, exam, and medical workup I feel the patient has been appropriately medically screened and is safe for discharge home. Pertinent diagnoses were discussed with the patient. Patient was given return precautions.   Final Clinical Impressions(s) / ED Diagnoses   Final diagnoses:  Abdominal bloating    New Prescriptions New Prescriptions   No medications on file   I personally performed the services described in this documentation, which was scribed in my presence. The recorded information has been reviewed and is accurate.    Merryl Hacker, MD 03/11/17 [phone]

## 2017-03-10 NOTE — ED Triage Notes (Signed)
Onset 9pm after getting out of bathtub pt felt swollen from knees to face.  Has shortness of breath but pt thinks it is her anxiety.  NAD at triage.

## 2017-03-11 LAB — I-STAT CHEM 8, ED
BUN: 11 mg/dL (ref 6–20)
CREATININE: 0.7 mg/dL (ref 0.44–1.00)
Calcium, Ion: 1.15 mmol/L (ref 1.15–1.40)
Chloride: 103 mmol/L (ref 101–111)
Glucose, Bld: 99 mg/dL (ref 65–99)
HEMATOCRIT: 40 % (ref 36.0–46.0)
HEMOGLOBIN: 13.6 g/dL (ref 12.0–15.0)
POTASSIUM: 3.4 mmol/L — AB (ref 3.5–5.1)
Sodium: 140 mmol/L (ref 135–145)
TCO2: 24 mmol/L (ref 0–100)

## 2017-03-11 NOTE — Discharge Instructions (Signed)
You were seen today for concerns for swelling and bloating the legs and abdomen. Your exam is reassuring. Unclear the cause of your symptoms. You improved while in the emergency department. Continue to monitor for signs and symptoms of worsening.

## 2017-11-18 ENCOUNTER — Encounter: Payer: Self-pay | Admitting: *Deleted

## 2018-08-21 ENCOUNTER — Emergency Department (HOSPITAL_COMMUNITY)
Admission: EM | Admit: 2018-08-21 | Discharge: 2018-08-21 | Disposition: A | Discharge status: Home / Self Care | Payer: Medicaid Other | Attending: Emergency Medicine | Admitting: Emergency Medicine

## 2018-08-21 ENCOUNTER — Emergency Department (HOSPITAL_COMMUNITY): Payer: Medicaid Other

## 2018-08-21 ENCOUNTER — Other Ambulatory Visit: Payer: Self-pay

## 2018-08-21 ENCOUNTER — Encounter (HOSPITAL_COMMUNITY): Payer: Self-pay | Admitting: Emergency Medicine

## 2018-08-21 DIAGNOSIS — R51 Headache: Secondary | ICD-10-CM | POA: Insufficient documentation

## 2018-08-21 DIAGNOSIS — R112 Nausea with vomiting, unspecified: Secondary | ICD-10-CM | POA: Insufficient documentation

## 2018-08-21 DIAGNOSIS — R0981 Nasal congestion: Secondary | ICD-10-CM | POA: Diagnosis not present

## 2018-08-21 DIAGNOSIS — M7918 Myalgia, other site: Secondary | ICD-10-CM | POA: Insufficient documentation

## 2018-08-21 DIAGNOSIS — R05 Cough: Secondary | ICD-10-CM | POA: Insufficient documentation

## 2018-08-21 DIAGNOSIS — R6889 Other general symptoms and signs: Secondary | ICD-10-CM

## 2018-08-21 DIAGNOSIS — R059 Cough, unspecified: Secondary | ICD-10-CM

## 2018-08-21 DIAGNOSIS — F17210 Nicotine dependence, cigarettes, uncomplicated: Secondary | ICD-10-CM | POA: Insufficient documentation

## 2018-08-21 MED ORDER — SODIUM CHLORIDE 0.9 % IV BOLUS
500.0000 mL | Freq: Once | INTRAVENOUS | Status: AC
Start: 2018-08-21 — End: 2018-08-21
  Administered 2018-08-21: 500 mL via INTRAVENOUS

## 2018-08-21 MED ORDER — BENZONATATE 100 MG PO CAPS: 100.0000 mg | ORAL_CAPSULE | Freq: Three times a day (TID) | ORAL | 0 refills | Status: DC | PRN

## 2018-08-21 MED ORDER — ALBUTEROL SULFATE HFA 108 (90 BASE) MCG/ACT IN AERS: 1.0000 | INHALATION_SPRAY | Freq: Four times a day (QID) | RESPIRATORY_TRACT | 0 refills | Status: DC | PRN

## 2018-08-21 MED ORDER — OSELTAMIVIR PHOSPHATE 75 MG PO CAPS: 75.0000 mg | ORAL_CAPSULE | Freq: Two times a day (BID) | ORAL | 0 refills | Status: AC

## 2018-08-21 MED ORDER — ONDANSETRON 4 MG PO TBDP: 4.0000 mg | ORAL_TABLET | Freq: Three times a day (TID) | ORAL | 0 refills | Status: DC | PRN

## 2018-08-21 MED ORDER — KETOROLAC TROMETHAMINE 30 MG/ML IJ SOLN
30.0000 mg | Freq: Once | INTRAMUSCULAR | Status: AC
  Administered 2018-08-21: 30 mg via INTRAVENOUS
  Filled 2018-08-21: qty 1

## 2018-08-21 MED ORDER — FLUTICASONE PROPIONATE 50 MCG/ACT NA SUSP: 2.0000 | Freq: Every day | NASAL | 2 refills | Status: DC

## 2018-08-21 NOTE — ED Notes (Signed)
ED Provider at bedside. 

## 2018-08-21 NOTE — ED Triage Notes (Signed)
Pt c/o flu-like s/sx going on for two days. Reports subjective fevers, generalized body aches, N/V. Has been taking Tylenol Cold & Flu, without improvement. Reports sick contacts at church. Reports coughing/throwing up "blood & phlegm".

## 2018-08-21 NOTE — ED Provider Notes (Signed)
Emergency Department Provider Note   I have reviewed the triage vital signs and the nursing notes.   HISTORY  Chief Complaint No chief complaint on file.   HPI Alicia [name] is a 37 y.o. female with PMH of OA and anxiety presents to the emergency department for evaluation of flulike symptoms.  Patient began feeling badly yesterday.  She has been feeling body aches, chills, headache, cough, runny nose.  Patient is also had some nausea and vomiting.  She states the vomiting episode began as trying to clear her throat and coughing.  She coughed up some brown mucus and then had one episode of vomiting with traces of bright red blood.  Shortly afterwards she developed a brief nosebleed which resolved spontaneously.  No additional vomiting or diarrhea.  She continues to feel very fatigued and have body aches.  She notes a sick contact at church but unclear what that person had. No radiation of symptoms or modifying factors.    Past Medical History:  Diagnosis Date  . Anxiety   . Bladder infection   . Complication of anesthesia    Epidural did not work w/2nd preg. labor  . Depression    No med. currently, Stopped with + UPT  . Depression 06/27/2012  . Gonorrhea   . Hemorrhoids 06/27/2012  . Osteoarthritis 06/27/2012  . Panic disorder 06/27/2012    Patient Active Problem List   Diagnosis Date Noted  . Excessive fetal growth affecting management of mother, antepartum 01/22/2013  . GERD without esophagitis 01/22/2013    Past Surgical History:  Procedure Laterality Date  . BREAST MASS EXCISION     Benign  . CESAREAN SECTION     X 1 1st preg., then VBAC  . CESAREAN SECTION N/A 01/27/2013   Procedure: CESAREAN SECTION;  Surgeon: Shelly Bombard, MD;  Location: [facility];  Service: Obstetrics;  Laterality: N/A;  . MOUTH SURGERY      Allergies Patient has no known allergies.  Family History  Problem Relation Age of Onset  . Other Neg Hx   . Alcohol abuse Neg Hx   . Arthritis  Neg Hx   . Asthma Neg Hx   . Birth defects Neg Hx   . COPD Neg Hx   . Cancer Neg Hx   . Depression Neg Hx   . Drug abuse Neg Hx   . Diabetes Neg Hx   . Early death Neg Hx   . Hearing loss Neg Hx   . Heart disease Neg Hx   . Hyperlipidemia Neg Hx   . Hypertension Neg Hx   . Kidney disease Neg Hx   . Learning disabilities Neg Hx   . Mental illness Neg Hx   . Mental retardation Neg Hx   . Miscarriages / Stillbirths Neg Hx   . Stroke Neg Hx   . Vision loss Neg Hx     Social History Social History   Tobacco Use  . Smoking status: Current Every Day Smoker    Packs/day: 1.00    Last attempt to quit: 06/22/2005    Years since quitting: 13.1  . Smokeless tobacco: Never Used  Substance Use Topics  . Alcohol use: No    Comment: Every other month  . Drug use: No    Review of Systems  Constitutional: Positive fever/chills and body aches.  Eyes: No visual changes. ENT: No sore throat. Cardiovascular: Denies chest pain. Respiratory: Denies shortness of breath. Positive cough.  Gastrointestinal: No abdominal pain. Positive nausea and  vomiting.  No diarrhea.  No constipation. Genitourinary: Negative for dysuria. Musculoskeletal: Negative for back pain. Skin: Negative for rash. Neurological: Negative for headaches, focal weakness or numbness.  10-point ROS otherwise negative.  ____________________________________________   PHYSICAL EXAM:  VITAL SIGNS: ED Triage Vitals  Enc Vitals Group     BP 08/21/18 0652 (!) 126/53     Pulse Rate 08/21/18 0652 87     Resp 08/21/18 0652 20     Temp 08/21/18 0652 (!) 101.2 F (38.4 C)     Temp Source 08/21/18 0652 Oral     SpO2 08/21/18 0652 98 %     Weight 08/21/18 0654 209 lb 7 oz (95 kg)     Height 08/21/18 0654 5\' 2"  (1.575 m)     Pain Score 08/21/18 0653 7   Constitutional: Alert and oriented. Well appearing and in no acute distress. Eyes: Conjunctivae are normal.  Head: Atraumatic. Nose: Positive  congestion/rhinnorhea. Mouth/Throat: Mucous membranes are moist.  Oropharynx non-erythematous. Neck: No stridor.   Cardiovascular: Normal rate, regular rhythm. Good peripheral circulation. Grossly normal heart sounds.   Respiratory: Normal respiratory effort.  No retractions. Lungs CTAB. Gastrointestinal: Soft and nontender. No distention.  Musculoskeletal: No lower extremity tenderness nor edema. No gross deformities of extremities. Neurologic:  Normal speech and language. No gross focal neurologic deficits are appreciated.  Skin:  Skin is warm, dry and intact. No rash noted.  ____________________________________________  RADIOLOGY  Dg Chest 2 View  Result Date: 08/21/2018 CLINICAL DATA:  Cough. EXAM: CHEST - 2 VIEW COMPARISON:  07/18/2009 FINDINGS: Mild cardiac enlargement. No pleural effusion or edema. Scar within the lingula is identified secondary to recent pneumonia. No new airspace opacities. IMPRESSION: 1. Lingular scarring.  No acute findings. Electronically Signed   By: Kerby Moors M.D.   On: 08/21/2018 07:53    ____________________________________________   PROCEDURES  Procedure(s) performed:   Procedures  None ____________________________________________   INITIAL IMPRESSION / ASSESSMENT AND PLAN / ED COURSE  Pertinent labs & imaging results that were available during my care of the patient were reviewed by me and considered in my medical decision making (see chart for details).  Patient presents to the ED for evaluation of flulike symptoms for the past 36 hours.  Patient is describing some blood in the emesis but this was trace amount and associated with some nosebleeding.  Emesis appeared to be post-tussive. Given the productive and possible blood plan for CXA to evaluation for PNA. Doubt sepsis with fever but no other vital sign abnormalities. Patient clinically has the flu. Offered Tamiflu given time from symptom onset. No concern clinically for PE. Plan for  IVF and Toradol while awaiting CXR. No labs at this time.   CXR reviewed. No acute infiltrate. Lingular scarring noted. Offered Tamiflu and supportive care meds at home.   At this time, I do not feel there is any life-threatening condition present. I have reviewed and discussed all results (EKG, imaging, lab, urine as appropriate), exam findings with patient. I have reviewed nursing notes and appropriate previous records.  I feel the patient is safe to be discharged home without further emergent workup. Discussed usual and customary return precautions. Patient and family (if present) verbalize understanding and are comfortable with this plan.  Patient will follow-up with their primary care provider. If they do not have a primary care provider, information for follow-up has been provided to them. All questions have been answered.  ____________________________________________  FINAL CLINICAL IMPRESSION(S) / ED DIAGNOSES  Final diagnoses:  Flu-like symptoms  Cough  Non-intractable vomiting with nausea, unspecified vomiting type     MEDICATIONS GIVEN DURING THIS VISIT:  Medications  sodium chloride 0.9 % bolus 500 mL (0 mLs Intravenous Stopped 08/21/18 0845)  ketorolac (TORADOL) 30 MG/ML injection 30 mg (30 mg Intravenous Given 08/21/18 0757)     NEW OUTPATIENT MEDICATIONS STARTED DURING THIS VISIT:  Discharge Medication List as of 08/21/2018  8:23 AM    START taking these medications   Details  albuterol (PROVENTIL HFA;VENTOLIN HFA) 108 (90 Base) MCG/ACT inhaler Inhale 1-2 puffs into the lungs every 6 (six) hours as needed for wheezing or shortness of breath., Starting Thu 08/21/2018, Print    benzonatate (TESSALON) 100 MG capsule Take 1 capsule (100 mg total) by mouth 3 (three) times daily as needed for cough., Starting Thu 08/21/2018, Print    ondansetron (ZOFRAN ODT) 4 MG disintegrating tablet Take 1 tablet (4 mg total) by mouth every 8 (eight) hours as needed for nausea or  vomiting., Starting Thu 08/21/2018, Print    oseltamivir (TAMIFLU) 75 MG capsule Take 1 capsule (75 mg total) by mouth every 12 (twelve) hours for 5 days., Starting Thu 08/21/2018, Until Tue 08/26/2018, Print        Note:  This document was prepared using Dragon voice recognition software and may include unintentional dictation errors.  Nanda Quinton, MD Emergency Medicine    Long, [facility], MD 08/21/18 [phone]

## 2018-08-21 NOTE — ED Notes (Signed)
Patient verbalizes understanding of discharge instructions. Opportunity for questioning and answers were provided. Armband removed by staff, pt discharged from ED.  

## 2018-08-21 NOTE — Discharge Instructions (Signed)

## 2018-08-29 ENCOUNTER — Encounter (HOSPITAL_COMMUNITY): Payer: Self-pay | Admitting: *Deleted

## 2018-08-29 ENCOUNTER — Emergency Department (HOSPITAL_COMMUNITY)
Admission: EM | Admit: 2018-08-29 | Discharge: 2018-08-29 | Disposition: A | Discharge status: Home / Self Care | Payer: Medicaid Other | Attending: Emergency Medicine | Admitting: Emergency Medicine

## 2018-08-29 DIAGNOSIS — N898 Other specified noninflammatory disorders of vagina: Secondary | ICD-10-CM | POA: Diagnosis present

## 2018-08-29 DIAGNOSIS — Z202 Contact with and (suspected) exposure to infections with a predominantly sexual mode of transmission: Secondary | ICD-10-CM | POA: Diagnosis not present

## 2018-08-29 DIAGNOSIS — Z79899 Other long term (current) drug therapy: Secondary | ICD-10-CM | POA: Diagnosis not present

## 2018-08-29 DIAGNOSIS — F17200 Nicotine dependence, unspecified, uncomplicated: Secondary | ICD-10-CM | POA: Diagnosis not present

## 2018-08-29 LAB — I-STAT BETA HCG BLOOD, ED (MC, WL, AP ONLY): I-stat hCG, quantitative: <5 m[IU]/mL (ref <5)

## 2018-08-29 LAB — WET PREP, GENITAL
Sperm: NONE SEEN
Trich, Wet Prep: NONE SEEN
Yeast Wet Prep HPF POC: NONE SEEN

## 2018-08-29 LAB — URINALYSIS, ROUTINE W REFLEX MICROSCOPIC
BACTERIA UA: NONE SEEN
Bilirubin Urine: NEGATIVE
Glucose, UA: NEGATIVE mg/dL
Hgb urine dipstick: NEGATIVE
Ketones, ur: NEGATIVE mg/dL
Leukocytes, UA: NEGATIVE
NITRITE: NEGATIVE
Protein, ur: 30 mg/dL — AB
Specific Gravity, Urine: 1.027 (ref 1.005–1.030)
pH: 8 (ref 5.0–8.0)

## 2018-08-29 MED ORDER — CEFTRIAXONE SODIUM 250 MG IJ SOLR
250.0000 mg | Freq: Once | INTRAMUSCULAR | Status: AC
  Administered 2018-08-29: 250 mg via INTRAMUSCULAR
  Filled 2018-08-29: qty 250

## 2018-08-29 MED ORDER — LIDOCAINE HCL (PF) 1 % IJ SOLN
INTRAMUSCULAR | Status: AC
  Administered 2018-08-29: 0.9 mL
  Filled 2018-08-29: qty 5

## 2018-08-29 MED ORDER — AZITHROMYCIN 250 MG PO TABS
1000.0000 mg | ORAL_TABLET | Freq: Once | ORAL | Status: AC
  Administered 2018-08-29: 1000 mg via ORAL
  Filled 2018-08-29: qty 4

## 2018-08-29 NOTE — ED Provider Notes (Signed)
[facility] EMERGENCY DEPARTMENT Provider Note   CSN: [id number] Arrival date & time: 08/29/18  0865     History   Chief Complaint Chief Complaint  Patient presents with  . Exposure to STD    HPI Alicia [name] is a 38 y.o. female with a hx of tobacco abuse, anxiety, depression, and prior gonorrhea who presents to the ED with concern for STD exposure today. She states she became intoxicated unprotected sex with a female partner 2 days prior.  This recent sexual partner informed her that he had some type of STD but did not specify what type.  She is here for evaluation and possible treatment.  She is fairly asymptomatic.  Denies fever, chills, nausea, vomiting, vaginal discharge, vaginal bleeding, or significant abdominal pain.  HPI  Past Medical History:  Diagnosis Date  . Anxiety   . Bladder infection   . Complication of anesthesia    Epidural did not work w/2nd preg. labor  . Depression    No med. currently, Stopped with + UPT  . Depression 06/27/2012  . Gonorrhea   . Hemorrhoids 06/27/2012  . Osteoarthritis 06/27/2012  . Panic disorder 06/27/2012    Patient Active Problem List   Diagnosis Date Noted  . Excessive fetal growth affecting management of mother, antepartum 01/22/2013  . GERD without esophagitis 01/22/2013    Past Surgical History:  Procedure Laterality Date  . BREAST MASS EXCISION     Benign  . CESAREAN SECTION     X 1 1st preg., then VBAC  . CESAREAN SECTION N/A 01/27/2013   Procedure: CESAREAN SECTION;  Surgeon: Shelly Bombard, MD;  Location: [facility];  Service: Obstetrics;  Laterality: N/A;  . MOUTH SURGERY       OB History    Gravida  3   Para  3   Term  2   Preterm  1   AB      Living  3     SAB      TAB      Ectopic      Multiple      Live Births  1            Home Medications    Prior to Admission medications   Medication Sig Start Date End Date Taking? Authorizing Provider  albuterol (PROVENTIL  HFA;VENTOLIN HFA) 108 (90 Base) MCG/ACT inhaler Inhale 1-2 puffs into the lungs every 6 (six) hours as needed for wheezing or shortness of breath. 08/21/18   Long, Wonda Olds, MD  benzonatate (TESSALON) 100 MG capsule Take 1 capsule (100 mg total) by mouth 3 (three) times daily as needed for cough. 08/21/18   Long, Wonda Olds, MD  clonazePAM (KLONOPIN) 1 MG tablet Take 1 mg by mouth 2 (two) times daily as needed for anxiety.     [provider]  fluticasone (FLONASE) 50 MCG/ACT nasal spray Place 2 sprays into both nostrils daily for 7 days. 08/21/18 08/28/18  Long, Wonda Olds, MD  ibuprofen (ADVIL,MOTRIN) 600 MG tablet TAKE 1 TABLET BY MOUTH EVERY 6 HOURS AS NEEDED FOR PAIN 11/25/15   Shelly Bombard, MD  LO LOESTRIN FE 1 MG-10 MCG / 10 MCG tablet Take 1 tablet by mouth daily. 08/23/16   Shelly Bombard, MD  loratadine (CLARITIN) 10 MG tablet Take 1 tablet (10 mg total) by mouth daily. 12/27/16   Hedges, Dellis Filbert, PA-C  ondansetron (ZOFRAN ODT) 4 MG disintegrating tablet Take 1 tablet (4 mg total) by mouth every  8 (eight) hours as needed for nausea or vomiting. 08/21/18   Long, Wonda Olds, MD    Family History Family History  Problem Relation Age of Onset  . Other Neg Hx   . Alcohol abuse Neg Hx   . Arthritis Neg Hx   . Asthma Neg Hx   . Birth defects Neg Hx   . COPD Neg Hx   . Cancer Neg Hx   . Depression Neg Hx   . Drug abuse Neg Hx   . Diabetes Neg Hx   . Early death Neg Hx   . Hearing loss Neg Hx   . Heart disease Neg Hx   . Hyperlipidemia Neg Hx   . Hypertension Neg Hx   . Kidney disease Neg Hx   . Learning disabilities Neg Hx   . Mental illness Neg Hx   . Mental retardation Neg Hx   . Miscarriages / Stillbirths Neg Hx   . Stroke Neg Hx   . Vision loss Neg Hx     Social History Social History   Tobacco Use  . Smoking status: Current Every Day Smoker    Packs/day: 1.00    Last attempt to quit: 06/22/2005    Years since quitting: 13.1  . Smokeless tobacco: Never Used    Substance Use Topics  . Alcohol use: No    Comment: Every other month  . Drug use: No     Allergies   Patient has no known allergies.   Review of Systems Review of Systems  Constitutional: Negative for chills and fever.  Gastrointestinal: Negative for abdominal pain, diarrhea, nausea and vomiting.  Genitourinary: Negative for vaginal bleeding and vaginal discharge.     Physical Exam Updated Vital Signs BP 112/69 (BP Location: Right Arm)   Pulse 81   Temp 98.7 F (37.1 C) (Oral)   Resp 18   LMP 08/20/2018 (Exact Date)   SpO2 100%   Physical Exam Vitals signs and nursing note reviewed. Exam conducted with a chaperone present.  Constitutional:      General: She is not in acute distress.    Appearance: She is well-developed.  HENT:     Head: Normocephalic and atraumatic.  Eyes:     General:        Right eye: No discharge.        Left eye: No discharge.     Conjunctiva/sclera: Conjunctivae normal.  Abdominal:     General: There is no distension.     Palpations: Abdomen is soft.     Tenderness: There is no abdominal tenderness. There is no guarding or rebound.  Genitourinary:    General: Normal vulva.     Labia:        Right: No rash or lesion.        Left: No rash or lesion.      Vagina: Vaginal discharge (white to yellow, thin, mild amount) present.     Cervix: No cervical motion tenderness or friability.     Adnexa:        Right: No mass or tenderness.         Left: No mass or tenderness.    Neurological:     Mental Status: She is alert.     Comments: Clear speech.   Psychiatric:        Behavior: Behavior normal.        Thought Content: Thought content normal.    ED Treatments / Results  Labs (all labs ordered are listed, but  only abnormal results are displayed) Labs Reviewed  WET PREP, GENITAL - Abnormal; Notable for the following components:      Result Value   Clue Cells Wet Prep HPF POC PRESENT (*)    WBC, Wet Prep HPF POC MANY (*)    All  other components within normal limits  URINALYSIS, ROUTINE W REFLEX MICROSCOPIC - Abnormal; Notable for the following components:   Color, Urine AMBER (*)    APPearance TURBID (*)    Protein, ur 30 (*)    All other components within normal limits  RPR  HIV ANTIBODY (ROUTINE TESTING W REFLEX)  I-STAT BETA HCG BLOOD, ED ([facility], [facility], [facility] ONLY)  GC/CHLAMYDIA PROBE AMP ([facility]) NOT AT [facility]    EKG None  Radiology No results found.  Procedures Procedures (including critical care time)  Medications Ordered in ED Medications  cefTRIAXone (ROCEPHIN) injection 250 mg (has no administration in time range)  azithromycin (ZITHROMAX) tablet 1,000 mg (has no administration in time range)     Initial Impression / Assessment and Plan / ED Course  I have reviewed the triage vital signs and the nursing notes.  Pertinent labs & imaging results that were available during my care of the patient were reviewed by me and considered in my medical decision making (see chart for details).   Patient presents to the emergency department due to STD exposure.  Benign physical exam.  No abdominal, cervical motion, or adnexal tenderness to raise concern for PID.  No urinary symptoms to indicate UTI, urinalysis is generally within normal limits with the exception of proteinuria which will require PCP recheck.  Her wet prep does not show trich, it does however show findings consistent with BV, however patient is without symptoms of vaginal discharge therefore do not feel that tx is necessary at this time, she is in agreement.  Prophylaxis for gonorrhea/chlamydia with ceftriaxone and azithromycin.  GC/chlamydia/HIV/RPR pending.  Patient aware if results are positive she will need to inform all sexual partners and seek further evaluation.  Discussed importance of protection when sexually active. I discussed results, treatment plan, need for follow-up, and return precautions with the patient. Provided opportunity for  questions, patient confirmed understanding and is in agreement with plan.   Final Clinical Impressions(s) / ED Diagnoses   Final diagnoses:  STD exposure    ED Discharge Orders    None       Amaryllis Dyke, PA-C 08/29/18 1203    Hayden Rasmussen, MD 08/30/18 1313

## 2018-08-29 NOTE — ED Triage Notes (Signed)
Pt in stating she was exposed to an STD, she is unsure what kind, here for testing and treatment

## 2018-08-29 NOTE — Discharge Instructions (Addendum)
You are seen in the emergency department today for an exposure to an STD.  We tested you for gonorrhea, chlamydia, HIV, and syphilis.  We have treated you for gonorrhea and chlamydia.  We will call you if any of these results are positive, if positive you will need to inform all sexual partners.  If syphilis or HIV are positive you will need to discuss with your primary care provider with the health department.  Your urinalysis showed some protein, this may be rechecked by primary care provider in 1 to 2 weeks.  Please use protection when sexually active.  Return to the ER for new or worsening symptoms or any other concerns.

## 2018-08-30 LAB — RPR: RPR: NONREACTIVE

## 2018-08-30 LAB — HIV ANTIBODY (ROUTINE TESTING W REFLEX): HIV SCREEN 4TH GENERATION: NONREACTIVE

## 2018-09-01 LAB — GC/CHLAMYDIA PROBE AMP (~~LOC~~) NOT AT ARMC
CHLAMYDIA, DNA PROBE: NEGATIVE
Neisseria Gonorrhea: NEGATIVE

## 2019-04-10 ENCOUNTER — Other Ambulatory Visit (HOSPITAL_COMMUNITY): Payer: Self-pay | Admitting: Psychiatry

## 2019-06-20 ENCOUNTER — Other Ambulatory Visit (HOSPITAL_COMMUNITY): Payer: Self-pay | Admitting: Psychiatry

## 2019-07-25 ENCOUNTER — Other Ambulatory Visit (HOSPITAL_COMMUNITY): Payer: Self-pay | Admitting: Psychiatry

## 2019-09-14 ENCOUNTER — Other Ambulatory Visit: Payer: Self-pay

## 2019-09-14 ENCOUNTER — Encounter (HOSPITAL_COMMUNITY): Payer: Self-pay

## 2019-09-14 ENCOUNTER — Emergency Department (HOSPITAL_COMMUNITY)
Admission: EM | Admit: 2019-09-14 | Discharge: 2019-09-14 | Disposition: A | Discharge status: Home / Self Care | Payer: Medicaid Other | Attending: Emergency Medicine | Admitting: Emergency Medicine

## 2019-09-14 DIAGNOSIS — R197 Diarrhea, unspecified: Secondary | ICD-10-CM | POA: Diagnosis not present

## 2019-09-14 DIAGNOSIS — Z5321 Procedure and treatment not carried out due to patient leaving prior to being seen by health care provider: Secondary | ICD-10-CM | POA: Insufficient documentation

## 2019-09-14 DIAGNOSIS — R112 Nausea with vomiting, unspecified: Secondary | ICD-10-CM | POA: Insufficient documentation

## 2019-09-14 DIAGNOSIS — R109 Unspecified abdominal pain: Secondary | ICD-10-CM | POA: Diagnosis not present

## 2019-09-14 MED ORDER — SODIUM CHLORIDE 0.9% FLUSH: 3.0000 mL | Freq: Once | INTRAVENOUS | Status: DC

## 2019-09-14 NOTE — ED Triage Notes (Signed)
Per EMS- Patient reports abdominal pain x 3 days. Today, the the patient has N/V/D. Patient also states she may be pregnant.

## 2019-09-14 NOTE — ED Notes (Signed)
Pt not seen in lobby when called for vitals or labs to be collected.

## 2019-09-14 NOTE — ED Notes (Signed)
Pt not seen in lobby when called for vitals or labs to be obtained.

## 2019-09-16 ENCOUNTER — Emergency Department (HOSPITAL_COMMUNITY)
Admission: EM | Admit: 2019-09-16 | Discharge: 2019-09-16 | Disposition: A | Discharge status: Home / Self Care | Payer: Medicaid Other | Attending: Emergency Medicine | Admitting: Emergency Medicine

## 2019-09-16 ENCOUNTER — Emergency Department (HOSPITAL_COMMUNITY): Payer: Medicaid Other

## 2019-09-16 ENCOUNTER — Encounter (HOSPITAL_COMMUNITY): Payer: Self-pay

## 2019-09-16 ENCOUNTER — Other Ambulatory Visit: Payer: Self-pay

## 2019-09-16 DIAGNOSIS — R1084 Generalized abdominal pain: Secondary | ICD-10-CM | POA: Diagnosis not present

## 2019-09-16 DIAGNOSIS — R112 Nausea with vomiting, unspecified: Secondary | ICD-10-CM

## 2019-09-16 DIAGNOSIS — N3000 Acute cystitis without hematuria: Secondary | ICD-10-CM | POA: Diagnosis not present

## 2019-09-16 LAB — LIPASE, BLOOD: Lipase: 32 U/L (ref 11–51)

## 2019-09-16 LAB — CBC WITH DIFFERENTIAL/PLATELET
Abs Immature Granulocytes: 0.02 10*3/uL (ref 0.00–0.07)
Basophils Absolute: 0 10*3/uL (ref 0.0–0.1)
Basophils Relative: 0 %
Eosinophils Absolute: 0.1 10*3/uL (ref 0.0–0.5)
Eosinophils Relative: 1 %
HCT: 42.7 % (ref 36.0–46.0)
Hemoglobin: 13.3 g/dL (ref 12.0–15.0)
Immature Granulocytes: 0 %
Lymphocytes Relative: 43 %
Lymphs Abs: 3.7 10*3/uL (ref 0.7–4.0)
MCH: 25.9 pg — ABNORMAL LOW (ref 26.0–34.0)
MCHC: 31.1 g/dL (ref 30.0–36.0)
MCV: 83.2 fL (ref 80.0–100.0)
Monocytes Absolute: 0.6 10*3/uL (ref 0.1–1.0)
Monocytes Relative: 7 %
Neutro Abs: 4.2 10*3/uL (ref 1.7–7.7)
Neutrophils Relative %: 49 %
Platelets: 311 10*3/uL (ref 150–400)
RBC: 5.13 MIL/uL — ABNORMAL HIGH (ref 3.87–5.11)
RDW: 13.8 % (ref 11.5–15.5)
WBC: 8.5 10*3/uL (ref 4.0–10.5)
nRBC: 0 % (ref 0.0–0.2)

## 2019-09-16 LAB — COMPREHENSIVE METABOLIC PANEL
ALT: 21 U/L (ref 0–44)
AST: 38 U/L (ref 15–41)
Albumin: 3.5 g/dL (ref 3.5–5.0)
Alkaline Phosphatase: 62 U/L (ref 38–126)
Anion gap: 11 (ref 5–15)
BUN: 7 mg/dL (ref 6–20)
CO2: 24 mmol/L (ref 22–32)
Calcium: 9.4 mg/dL (ref 8.9–10.3)
Chloride: 102 mmol/L (ref 98–111)
Creatinine, Ser: 0.7 mg/dL (ref 0.44–1.00)
GFR calc Af Amer: >60 mL/min (ref >60)
GFR calc non Af Amer: >60 mL/min (ref >60)
Glucose, Bld: 113 mg/dL — ABNORMAL HIGH (ref 70–99)
Potassium: 4.7 mmol/L (ref 3.5–5.1)
Sodium: 137 mmol/L (ref 135–145)
Total Bilirubin: 1.7 mg/dL — ABNORMAL HIGH (ref 0.3–1.2)
Total Protein: 7.8 g/dL (ref 6.5–8.1)

## 2019-09-16 LAB — I-STAT BETA HCG BLOOD, ED (MC, WL, AP ONLY): I-stat hCG, quantitative: <5 m[IU]/mL (ref <5)

## 2019-09-16 LAB — URINALYSIS, ROUTINE W REFLEX MICROSCOPIC
Bilirubin Urine: NEGATIVE
Glucose, UA: NEGATIVE mg/dL
Hgb urine dipstick: NEGATIVE
Ketones, ur: 5 mg/dL — AB
Nitrite: POSITIVE — AB
Protein, ur: >=300 mg/dL — AB
Specific Gravity, Urine: >1.046 — ABNORMAL HIGH (ref 1.005–1.030)
pH: 9 — ABNORMAL HIGH (ref 5.0–8.0)

## 2019-09-16 LAB — CBG MONITORING, ED: Glucose-Capillary: 106 mg/dL — ABNORMAL HIGH (ref 70–99)

## 2019-09-16 MED ORDER — FENTANYL CITRATE (PF) 100 MCG/2ML IJ SOLN
50.0000 ug | Freq: Once | INTRAMUSCULAR | Status: AC
  Administered 2019-09-16: 50 ug via INTRAVENOUS
  Filled 2019-09-16: qty 2

## 2019-09-16 MED ORDER — CEPHALEXIN 500 MG PO CAPS: 500.0000 mg | ORAL_CAPSULE | Freq: Three times a day (TID) | ORAL | 0 refills | Status: AC

## 2019-09-16 MED ORDER — FAMOTIDINE 20 MG PO TABS: 20.0000 mg | ORAL_TABLET | Freq: Two times a day (BID) | ORAL | 0 refills | Status: DC

## 2019-09-16 MED ORDER — ONDANSETRON HCL 4 MG/2ML IJ SOLN
4.0000 mg | Freq: Once | INTRAMUSCULAR | Status: AC
  Administered 2019-09-16: 4 mg via INTRAVENOUS
  Filled 2019-09-16: qty 2

## 2019-09-16 MED ORDER — SODIUM CHLORIDE 0.9 % IV BOLUS
1000.0000 mL | Freq: Once | INTRAVENOUS | Status: AC
  Administered 2019-09-16: 1000 mL via INTRAVENOUS

## 2019-09-16 MED ORDER — ONDANSETRON 4 MG PO TBDP: 4.0000 mg | ORAL_TABLET | Freq: Three times a day (TID) | ORAL | 0 refills | Status: DC | PRN

## 2019-09-16 MED ORDER — IOHEXOL 300 MG/ML  SOLN
100.0000 mL | Freq: Once | INTRAMUSCULAR | Status: AC | PRN
  Administered 2019-09-16: 20:00:00 100 mL via INTRAVENOUS

## 2019-09-16 MED ORDER — MORPHINE SULFATE (PF) 4 MG/ML IV SOLN
4.0000 mg | Freq: Once | INTRAVENOUS | Status: AC
  Administered 2019-09-16: 4 mg via INTRAVENOUS
  Filled 2019-09-16: qty 1

## 2019-09-16 NOTE — ED Triage Notes (Addendum)
Pt from home with ems for c.o continued abd pain n/v since Monday. Pt seen at [facility] and [facility] after triage. Pt reports 1 episode of diarrhea on Monday. No emesis today. Pt a.o, nad noted.

## 2019-09-16 NOTE — ED Provider Notes (Addendum)
Care assumed from [facility], please see her note for full details, but in brief Alicia [name] is a 39 y.o. female who presents with 3 days of abdominal pain, nausea and vomiting, pain is generalized.  Patient also initially had some diarrhea that has since improved.  Her blood work is reassuring.  Symptomatic treatment and CT scan ordered.  Patient is now tolerating fluids.  CT pending at shift change.  Plan: Follow-up on CT imaging and UA, if negative patient can be discharged home with outpatient symptomatic treatment.  Labs Reviewed  COMPREHENSIVE METABOLIC PANEL - Abnormal; Notable for the following components:      Result Value   Glucose, Bld 113 (*)    Total Bilirubin 1.7 (*)    All other components within normal limits  CBC WITH DIFFERENTIAL/PLATELET - Abnormal; Notable for the following components:   RBC 5.13 (*)    MCH 25.9 (*)    All other components within normal limits  URINALYSIS, ROUTINE W REFLEX MICROSCOPIC - Abnormal; Notable for the following components:   Color, Urine AMBER (*)    APPearance CLOUDY (*)    Specific Gravity, Urine >1.046 (*)    pH 9.0 (*)    Ketones, ur 5 (*)    Protein, ur >=300 (*)    Nitrite POSITIVE (*)    Leukocytes,Ua MODERATE (*)    Bacteria, UA MANY (*)    All other components within normal limits  CBG MONITORING, ED - Abnormal; Notable for the following components:   Glucose-Capillary 106 (*)    All other components within normal limits  LIPASE, BLOOD  I-STAT BETA HCG BLOOD, ED ([facility], [facility], [facility] ONLY)   CT Abdomen Pelvis W Contrast  Result Date: 09/16/2019 CLINICAL DATA:  Abdominal pain. EXAM: CT ABDOMEN AND PELVIS WITH CONTRAST TECHNIQUE: Multidetector CT imaging of the abdomen and pelvis was performed using the standard protocol following bolus administration of intravenous contrast. CONTRAST:  190mL OMNIPAQUE IOHEXOL 300 MG/ML  SOLN COMPARISON:  None. FINDINGS: Lower chest: No acute abnormality. Hepatobiliary: No focal liver abnormality  is seen. Ill-defined gallstones are seen within the lumen of an otherwise normal-appearing gallbladder. Pancreas: Unremarkable. No pancreatic ductal dilatation or surrounding inflammatory changes. Spleen: Normal in size without focal abnormality. Adrenals/Urinary Tract: Adrenal glands are unremarkable. Kidneys are normal, without renal calculi, focal lesion, or hydronephrosis. Bladder is unremarkable. Stomach/Bowel: There is mild thickening of the gastric antrum and proximal portion of the duodenum. The appendix is not clearly identified. No evidence of bowel dilatation. Vascular/Lymphatic: No significant vascular findings are present. No enlarged abdominal or pelvic lymph nodes. Reproductive: A 2.8 cm x 2.1 cm well-defined area of increased attenuation is seen within the posterior aspect of the uterus, slightly to the right of midline (axial CT images 59 through 64, CT series number 3). A 1.3 cm posterior right adnexa cyst is seen. Other: No abdominal wall hernia or abnormality. No abdominopelvic ascites. Musculoskeletal: No acute or significant osseous findings. IMPRESSION: 1. Cholelithiasis. 2. Mild thickening of the gastric antrum and proximal duodenum which may represent sequelae associated with mild gastritis and/or duodenitis. 3. Hyperdense area within the posterior aspect of the uterus, as described above. The presence of a vascular uterine mass cannot be excluded. Correlation with pelvic ultrasound is recommended. 4. Small right adnexal cyst, likely ovarian in origin. Electronically Signed   By: Virgina Norfolk M.D.   On: 09/16/2019 20:25   MDM  CT shows cholelithiasis without any evidence of acute cholecystitis.  There is some thickening of  the gastric antrum and duodenum suggestive of mild gastritis or duodenitis, patient has had some associated vomiting and diarrhea, I suspect this is likely viral, her lab work is reassuring.  There is a hyperdense area within the posterior aspect of the uterus,  mass cannot be ruled out but patient is not having focal pain in this area, so feel this can be followed up as an outpatient with her OB/GYN, small right adnexal cyst also noted, no concern for torsion.  Patient's urinalysis returns consistent with UTI, will also treat with Keflex.  I discussed results of work-up with the patient, she will be discharged with Zofran, Pepcid I encouraged her to use over-the-counter Maalox as well to treat gastritis symptoms, and Keflex for her UTI.  PCP follow-up recommended and return precautions discussed.  Discharged home in good condition.   Jacqlyn Larsen, PA-C 09/16/19 2125    Jacqlyn Larsen, PA-C 09/16/19 2345    Virgel Manifold, MD 09/17/19 [phone]

## 2019-09-16 NOTE — ED Provider Notes (Signed)
[facility] DEPARTMENT Provider Note   CSN: [id number] Arrival date & time: 09/16/19  1352     History Chief Complaint  Patient presents with  . Abdominal Pain  . Nausea    Ranya Duncan is a 39 y.o. female who presents for evaluation of abdominal pain, nausea/vomiting that has been ongoing for the last 3 days.  She states that she has generalized abdominal pain with no focal point.  She states it is sometimes sharp and sometimes cramping.  She reports associated nausea/vomiting.  Emesis is nonbloody, nonbilious.  She states she initially had some diarrhea 3 days ago but that has since improved.  Her last bowel movement was about 3 days ago.  She states she still passing flatus.  She denies any alleviating or aggravating factors.  She states that she has not noted any fever, Difficulty breathing, dysuria, hematuria, vaginal discharge.  Her last menstrual cycle was about a week and a half ago.  The history is provided by the patient.       Past Medical History:  Diagnosis Date  . Anxiety   . Bladder infection   . Complication of anesthesia    Epidural did not work w/2nd preg. labor  . Depression    No med. currently, Stopped with + UPT  . Depression 06/27/2012  . Gonorrhea   . Hemorrhoids 06/27/2012  . Osteoarthritis 06/27/2012  . Panic disorder 06/27/2012    Patient Active Problem List   Diagnosis Date Noted  . Excessive fetal growth affecting management of mother, antepartum 01/22/2013  . GERD without esophagitis 01/22/2013    Past Surgical History:  Procedure Laterality Date  . BREAST MASS EXCISION     Benign  . CESAREAN SECTION     X 1 1st preg., then VBAC  . CESAREAN SECTION N/A 01/27/2013   Procedure: CESAREAN SECTION;  Surgeon: Shelly Bombard, MD;  Location: [facility];  Service: Obstetrics;  Laterality: N/A;  . MOUTH SURGERY       OB History    Gravida  3   Para  3   Term  2   Preterm  1   AB      Living  3     SAB     TAB      Ectopic      Multiple      Live Births  1           Family History  Problem Relation Age of Onset  . Cancer Mother   . Other Neg Hx   . Alcohol abuse Neg Hx   . Arthritis Neg Hx   . Asthma Neg Hx   . Birth defects Neg Hx   . COPD Neg Hx   . Depression Neg Hx   . Drug abuse Neg Hx   . Diabetes Neg Hx   . Early death Neg Hx   . Hearing loss Neg Hx   . Heart disease Neg Hx   . Hyperlipidemia Neg Hx   . Hypertension Neg Hx   . Kidney disease Neg Hx   . Learning disabilities Neg Hx   . Mental illness Neg Hx   . Mental retardation Neg Hx   . Miscarriages / Stillbirths Neg Hx   . Stroke Neg Hx   . Vision loss Neg Hx     Social History   Tobacco Use  . Smoking status: Current Every Day Smoker    Packs/day: 1.00    Types: Cigarettes  Last attempt to quit: 06/22/2005    Years since quitting: 14.2  . Smokeless tobacco: Never Used  Substance Use Topics  . Alcohol use: No    Comment: Every other month  . Drug use: No    Home Medications Prior to Admission medications   Medication Sig Start Date End Date Taking? Authorizing Provider  albuterol (PROVENTIL HFA;VENTOLIN HFA) 108 (90 Base) MCG/ACT inhaler Inhale 1-2 puffs into the lungs every 6 (six) hours as needed for wheezing or shortness of breath. 08/21/18   Long, Wonda Olds, MD  benzonatate (TESSALON) 100 MG capsule Take 1 capsule (100 mg total) by mouth 3 (three) times daily as needed for cough. 08/21/18   Long, Wonda Olds, MD  cephALEXin (KEFLEX) 500 MG capsule Take 1 capsule (500 mg total) by mouth 3 (three) times daily for 7 days. 09/16/19 09/23/19  Jacqlyn Larsen, PA-C  clonazePAM (KLONOPIN) 1 MG tablet Take 1 mg by mouth 2 (two) times daily as needed for anxiety.     [provider]  famotidine (PEPCID) 20 MG tablet Take 1 tablet (20 mg total) by mouth 2 (two) times daily. 09/16/19   Jacqlyn Larsen, PA-C  fluticasone (FLONASE) 50 MCG/ACT nasal spray Place 2 sprays into both nostrils daily for 7  days. 08/21/18 09/16/19  Long, Wonda Olds, MD  ibuprofen (ADVIL,MOTRIN) 600 MG tablet TAKE 1 TABLET BY MOUTH EVERY 6 HOURS AS NEEDED FOR PAIN 11/25/15   Shelly Bombard, MD  LO LOESTRIN FE 1 MG-10 MCG / 10 MCG tablet Take 1 tablet by mouth daily. 08/23/16   Shelly Bombard, MD  loratadine (CLARITIN) 10 MG tablet Take 1 tablet (10 mg total) by mouth daily. 12/27/16   Hedges, Dellis Filbert, PA-C  ondansetron (ZOFRAN ODT) 4 MG disintegrating tablet Take 1 tablet (4 mg total) by mouth every 8 (eight) hours as needed for nausea or vomiting. 09/16/19   Volanda Napoleon, PA-C    Allergies    Penicillins  Review of Systems   Review of Systems  Constitutional: Negative for fever.  Respiratory: Negative for cough and shortness of breath.   Cardiovascular: Negative for chest pain.  Gastrointestinal: Positive for abdominal pain, nausea and vomiting. Negative for diarrhea.  Genitourinary: Negative for dysuria and hematuria.  Neurological: Negative for headaches.  All other systems reviewed and are negative.   Physical Exam Updated Vital Signs BP (!) 116/47   Pulse 64   Temp 99.1 F (37.3 C) (Oral)   Resp 16   LMP 09/07/2019   SpO2 99%   Physical Exam Vitals and nursing note reviewed.  Constitutional:      Appearance: Normal appearance. She is well-developed.  HENT:     Head: Normocephalic and atraumatic.  Eyes:     General: Lids are normal.     Conjunctiva/sclera: Conjunctivae normal.     Pupils: Pupils are equal, round, and reactive to light.  Cardiovascular:     Rate and Rhythm: Normal rate and regular rhythm.     Pulses: Normal pulses.     Heart sounds: Normal heart sounds. No murmur. No friction rub. No gallop.   Pulmonary:     Effort: Pulmonary effort is normal.     Breath sounds: Normal breath sounds.     Comments: Lungs clear to auscultation bilaterally.  Symmetric chest rise.  No wheezing, rales, rhonchi. Abdominal:     Palpations: Abdomen is soft. Abdomen is not rigid.      Tenderness: There is generalized abdominal tenderness. There is no  right CVA tenderness, left CVA tenderness or guarding.     Comments: Abdomen soft, nondistended.  Generalized tenderness with no focal point.  No rigidity, guarding.  No CVA tenderness noted bilaterally.  Musculoskeletal:        General: Normal range of motion.     Cervical back: Full passive range of motion without pain.  Skin:    General: Skin is warm and dry.     Capillary Refill: Capillary refill takes less than 2 seconds.  Neurological:     Mental Status: She is alert and oriented to person, place, and time.  Psychiatric:        Speech: Speech normal.     ED Results / Procedures / Treatments   Labs (all labs ordered are listed, but only abnormal results are displayed) Labs Reviewed  COMPREHENSIVE METABOLIC PANEL - Abnormal; Notable for the following components:      Result Value   Glucose, Bld 113 (*)    Total Bilirubin 1.7 (*)    All other components within normal limits  CBC WITH DIFFERENTIAL/PLATELET - Abnormal; Notable for the following components:   RBC 5.13 (*)    MCH 25.9 (*)    All other components within normal limits  URINALYSIS, ROUTINE W REFLEX MICROSCOPIC - Abnormal; Notable for the following components:   Color, Urine AMBER (*)    APPearance CLOUDY (*)    Specific Gravity, Urine >1.046 (*)    pH 9.0 (*)    Ketones, ur 5 (*)    Protein, ur >=300 (*)    Nitrite POSITIVE (*)    Leukocytes,Ua MODERATE (*)    Bacteria, UA MANY (*)    All other components within normal limits  CBG MONITORING, ED - Abnormal; Notable for the following components:   Glucose-Capillary 106 (*)    All other components within normal limits  LIPASE, BLOOD  I-STAT BETA HCG BLOOD, ED ([facility], [facility], [facility] ONLY)    EKG None  Radiology CT Abdomen Pelvis W Contrast  Result Date: 09/16/2019 CLINICAL DATA:  Abdominal pain. EXAM: CT ABDOMEN AND PELVIS WITH CONTRAST TECHNIQUE: Multidetector CT imaging of the abdomen and pelvis  was performed using the standard protocol following bolus administration of intravenous contrast. CONTRAST:  166mL OMNIPAQUE IOHEXOL 300 MG/ML  SOLN COMPARISON:  None. FINDINGS: Lower chest: No acute abnormality. Hepatobiliary: No focal liver abnormality is seen. Ill-defined gallstones are seen within the lumen of an otherwise normal-appearing gallbladder. Pancreas: Unremarkable. No pancreatic ductal dilatation or surrounding inflammatory changes. Spleen: Normal in size without focal abnormality. Adrenals/Urinary Tract: Adrenal glands are unremarkable. Kidneys are normal, without renal calculi, focal lesion, or hydronephrosis. Bladder is unremarkable. Stomach/Bowel: There is mild thickening of the gastric antrum and proximal portion of the duodenum. The appendix is not clearly identified. No evidence of bowel dilatation. Vascular/Lymphatic: No significant vascular findings are present. No enlarged abdominal or pelvic lymph nodes. Reproductive: A 2.8 cm x 2.1 cm well-defined area of increased attenuation is seen within the posterior aspect of the uterus, slightly to the right of midline (axial CT images 59 through 64, CT series number 3). A 1.3 cm posterior right adnexa cyst is seen. Other: No abdominal wall hernia or abnormality. No abdominopelvic ascites. Musculoskeletal: No acute or significant osseous findings. IMPRESSION: 1. Cholelithiasis. 2. Mild thickening of the gastric antrum and proximal duodenum which may represent sequelae associated with mild gastritis and/or duodenitis. 3. Hyperdense area within the posterior aspect of the uterus, as described above. The presence of a vascular uterine mass cannot  be excluded. Correlation with pelvic ultrasound is recommended. 4. Small right adnexal cyst, likely ovarian in origin. Electronically Signed   By: Virgina Norfolk M.D.   On: 09/16/2019 20:25    Procedures Procedures (including critical care time)  Medications Ordered in ED Medications  ondansetron  (ZOFRAN) injection 4 mg (4 mg Intravenous Given 09/16/19 1428)  morphine 4 MG/ML injection 4 mg (4 mg Intravenous Given 09/16/19 1429)  fentaNYL (SUBLIMAZE) injection 50 mcg (50 mcg Intravenous Given 09/16/19 1652)  sodium chloride 0.9 % bolus 1,000 mL (0 mLs Intravenous Stopped 09/16/19 2223)  fentaNYL (SUBLIMAZE) injection 50 mcg (50 mcg Intravenous Given 09/16/19 2036)  ondansetron (ZOFRAN) injection 4 mg (4 mg Intravenous Given 09/16/19 2035)  iohexol (OMNIPAQUE) 300 MG/ML solution 100 mL (100 mLs Intravenous Contrast Given 09/16/19 2008)    ED Course  I have reviewed the triage vital signs and the nursing notes.  Pertinent labs & imaging results that were available during my care of the patient were reviewed by me and considered in my medical decision making (see chart for details).    MDM Rules/Calculators/A&P                       39 year old female who presents for evaluation of generalized abdominal pain, nausea/vomiting that began 3 days ago.  No fevers, urinary complaints.  Patient is afebrile, non-toxic appearing, sitting comfortably on examination table. Vital signs reviewed and stable.  On exam, she has generalized abdominal tenderness.  No rigidity, guarding.  Consider infectious etiology versus viral GI illness.  Doubt appendicitis or diverticulitis given history/physical exam.  History/exam will plan for concerning for PID, ovarian torsion.  Plan check labs.  Lipase is unremarkable.  CMP shows normal BUN and creatinine.  CBC shows no leukocytosis or anemia.  I-STAT beta is negative.  Reevaluation.  Patient still feeling uncomfortable.  Repeat abdominal exam shows she has continued pain.  She reports no improvement after analgesics.  She has been able to tolerate some ginger ale without any vomiting.  Patient signed out to [facility] pending CT abd/pelvis.   Portions of this note were generated with [employer]. Dictation errors may occur despite best attempts at  proofreading.  Final Clinical Impression(s) / ED Diagnoses Final diagnoses:  Generalized abdominal pain  Non-intractable vomiting with nausea, unspecified vomiting type  Acute cystitis without hematuria    Rx / DC Orders ED Discharge Orders         Ordered    ondansetron (ZOFRAN ODT) 4 MG disintegrating tablet  Every 8 hours PRN     09/16/19 1955    famotidine (PEPCID) 20 MG tablet  2 times daily,   Status:  Discontinued     09/16/19 2110    famotidine (PEPCID) 20 MG tablet  2 times daily     09/16/19 2111    cephALEXin (KEFLEX) 500 MG capsule  3 times daily     09/16/19 2123           Desma Mcgregor 09/18/19 1504    Little, Wenda Overland, MD 09/21/19 [phone]

## 2019-09-16 NOTE — Discharge Instructions (Addendum)
Your work-up today was reassuring, does show UTI and some gastritis (inflammation of stomach lining)  Continue making sure you are staying hydrated drinking fluids.  Avoid taking NSAIDs like ibuprofen, Aleve or aspirin as they can worsen stomach irritation, avoid acidic foods, avoid alcohol.  Take Antibiotics for UTI, complete entire course even if symptoms have improved  Take Zofran as needed for nausea/vomiting. Use pepcid twice daily before breakfast and dinner, and use Maalox for breakthrough pain.  Return to the Emergency Department immediately if you experience any worsening abdominal pain, fever, persistent nausea and vomiting, inability keep any food down, pain with urination, blood in your urine or any other worsening or concerning symptoms.  Your CT scan showed A 2.8 cm x 2.1 cm well-defined area of increased attenuation is seen within the posterior aspect of the uterus, as well as a small ovarian cyst.  Please follow-up with your OB/GYN regarding this for a pelvic ultrasound.

## 2019-09-16 NOTE — ED Notes (Signed)
The pt thinks that she is dehydrated

## 2019-09-16 NOTE — ED Notes (Signed)
Pt wants to know what is going on  Pa notified

## 2019-09-16 NOTE — ED Notes (Signed)
Pt c/o pain again

## 2019-09-25 ENCOUNTER — Ambulatory Visit: Payer: Medicaid Other | Admitting: Obstetrics

## 2019-09-25 ENCOUNTER — Encounter: Payer: Self-pay | Admitting: Obstetrics

## 2019-09-25 ENCOUNTER — Other Ambulatory Visit: Payer: Self-pay

## 2019-09-25 ENCOUNTER — Ambulatory Visit (INDEPENDENT_AMBULATORY_CARE_PROVIDER_SITE_OTHER)
Admission: RE | Admit: 2019-09-25 | Discharge: 2019-09-25 | Disposition: A | Discharge status: Other Acute Inpatient Hospital | Payer: Medicaid Other | Source: Ambulatory Visit

## 2019-09-25 VITALS — BP 116/66 | HR 80 | Wt 216.0 lb

## 2019-09-25 DIAGNOSIS — M545 Low back pain, unspecified: Secondary | ICD-10-CM

## 2019-09-25 DIAGNOSIS — M5380 Other specified dorsopathies, site unspecified: Secondary | ICD-10-CM

## 2019-09-25 DIAGNOSIS — R102 Pelvic and perineal pain: Secondary | ICD-10-CM

## 2019-09-25 DIAGNOSIS — R1084 Generalized abdominal pain: Secondary | ICD-10-CM | POA: Diagnosis not present

## 2019-09-25 MED ORDER — CYCLOBENZAPRINE HCL 10 MG PO TABS: 10.0000 mg | ORAL_TABLET | Freq: Two times a day (BID) | ORAL | 0 refills | Status: DC | PRN

## 2019-09-25 NOTE — ED Provider Notes (Signed)
Virtual Visit via Video Note:  Alicia [name]  initiated request for Telemedicine visit with [facility] team. I connected with Alicia [name]  on 09/25/2019 at 3:35 PM  for a synchronized telemedicine visit using a video enabled HIPPA compliant telemedicine application. I verified that I am speaking with Alicia [name]  using two identifiers. Alicia Eagles, PA-C  was physically located in a [facility] Urgent care site and [name] was located at a different location.   The limitations of evaluation and management by telemedicine as well as the availability of in-person appointments were discussed. Patient was informed that she  may incur a bill ( including co-pay) for this virtual visit encounter. Alicia [name]  expressed understanding and gave verbal consent to proceed with virtual visit.     History of Present Illness:Alicia [name]  is a 39 y.o. female presents with 2-3 day history of acute onset persistent low back pain with stiffness.  Patient states that she has longstanding history of this and recurs randomly.  Denies any recent falls, trauma, weakness, hematuria.  She has typically tried Tylenol and NSAID without relief.  However, she does respond well to muscle relaxants.  ROS  No current facility-administered medications for this encounter.   Current Outpatient Medications  Medication Sig Dispense Refill  . albuterol (PROVENTIL HFA;VENTOLIN HFA) 108 (90 Base) MCG/ACT inhaler Inhale 1-2 puffs into the lungs every 6 (six) hours as needed for wheezing or shortness of breath. 1 Inhaler 0  . benzonatate (TESSALON) 100 MG capsule Take 1 capsule (100 mg total) by mouth 3 (three) times daily as needed for cough. 21 capsule 0  . clonazePAM (KLONOPIN) 1 MG tablet Take 1 mg by mouth 2 (two) times daily as needed for anxiety.     . famotidine (PEPCID) 20 MG tablet Take 1 tablet (20 mg total) by mouth 2 (two) times daily. 30 tablet 0  . fluticasone (FLONASE) 50 MCG/ACT nasal spray Place  2 sprays into both nostrils daily for 7 days. 1 g 2  . ibuprofen (ADVIL,MOTRIN) 600 MG tablet TAKE 1 TABLET BY MOUTH EVERY 6 HOURS AS NEEDED FOR PAIN 30 tablet 5  . LO LOESTRIN FE 1 MG-10 MCG / 10 MCG tablet Take 1 tablet by mouth daily. 3 Package 4  . loratadine (CLARITIN) 10 MG tablet Take 1 tablet (10 mg total) by mouth daily. 30 tablet 0  . ondansetron (ZOFRAN ODT) 4 MG disintegrating tablet Take 1 tablet (4 mg total) by mouth every 8 (eight) hours as needed for nausea or vomiting. 6 tablet 0     Allergies  Allergen Reactions  . Penicillins      Past Medical History:  Diagnosis Date  . Anxiety   . Bladder infection   . Complication of anesthesia    Epidural did not work w/2nd preg. labor  . Depression    No med. currently, Stopped with + UPT  . Depression 06/27/2012  . Gonorrhea   . Hemorrhoids 06/27/2012  . Osteoarthritis 06/27/2012  . Panic disorder 06/27/2012    Past Surgical History:  Procedure Laterality Date  . BREAST MASS EXCISION     Benign  . CESAREAN SECTION     X 1 1st preg., then VBAC  . CESAREAN SECTION N/A 01/27/2013   Procedure: CESAREAN SECTION;  Surgeon: Shelly Bombard, MD;  Location: [facility];  Service: Obstetrics;  Laterality: N/A;  . MOUTH SURGERY        Observations/Objective: Physical Exam Visit done through phone only as  patient had technical difficulties with the video link.  Assessment and Plan:  1. Acute low back pain without sciatica, unspecified back pain laterality   2. Back tightness    Patient is to start Flexeril for her back tightness and pain.  If symptoms fail to improve, patient must be seen in clinic for in person evaluation counseled patient on potential for adverse effects with medications prescribed/recommended today, ER and return-to-clinic precautions discussed, patient verbalized understanding.  Of note, there was a system issue and I was unable to check patient in and document her time in department.  Follow Up  Instructions:    I discussed the assessment and treatment plan with the patient. The patient was provided an opportunity to ask questions and all were answered. The patient agreed with the plan and demonstrated an understanding of the instructions.   The patient was advised to call back or seek an in-person evaluation if the symptoms worsen or if the condition fails to improve as anticipated.  I provided 10 minutes of non-face-to-face time during this encounter.    Alicia Eagles, PA-C  09/25/2019 3:35 PM         Alicia Eagles, PA-C 09/25/19 1639

## 2019-09-25 NOTE — Progress Notes (Signed)
Patient ID: Alicia [name], female   DOB: 08-Mar-1981, 39 y.o.   MRN: [id number]  Chief Complaint  Patient presents with  . Hospitalization Follow-up    HPI Alicia [name] is a 39 y.o. female.  Seen in the ER for generalized abdominal pain.  Presents today for follow up.  Pain is much improved. HPI  Past Medical History:  Diagnosis Date  . Anxiety   . Bladder infection   . Complication of anesthesia    Epidural did not work w/2nd preg. labor  . Depression    No med. currently, Stopped with + UPT  . Depression 06/27/2012  . Gonorrhea   . Hemorrhoids 06/27/2012  . Osteoarthritis 06/27/2012  . Panic disorder 06/27/2012    Past Surgical History:  Procedure Laterality Date  . BREAST MASS EXCISION     Benign  . CESAREAN SECTION     X 1 1st preg., then VBAC  . CESAREAN SECTION N/A 01/27/2013   Procedure: CESAREAN SECTION;  Surgeon: Shelly Bombard, MD;  Location: [facility];  Service: Obstetrics;  Laterality: N/A;  . MOUTH SURGERY      Family History  Problem Relation Age of Onset  . Cancer Mother   . Other Neg Hx   . Alcohol abuse Neg Hx   . Arthritis Neg Hx   . Asthma Neg Hx   . Birth defects Neg Hx   . COPD Neg Hx   . Depression Neg Hx   . Drug abuse Neg Hx   . Diabetes Neg Hx   . Early death Neg Hx   . Hearing loss Neg Hx   . Heart disease Neg Hx   . Hyperlipidemia Neg Hx   . Hypertension Neg Hx   . Kidney disease Neg Hx   . Learning disabilities Neg Hx   . Mental illness Neg Hx   . Mental retardation Neg Hx   . Miscarriages / Stillbirths Neg Hx   . Stroke Neg Hx   . Vision loss Neg Hx     Social History Social History   Tobacco Use  . Smoking status: Current Every Day Smoker    Packs/day: 1.00    Types: Cigarettes    Last attempt to quit: 06/22/2005    Years since quitting: 14.2  . Smokeless tobacco: Never Used  Substance Use Topics  . Alcohol use: No    Comment: Every other month  . Drug use: No    Allergies  Allergen Reactions  . Penicillins      Current Outpatient Medications  Medication Sig Dispense Refill  . albuterol (PROVENTIL HFA;VENTOLIN HFA) 108 (90 Base) MCG/ACT inhaler Inhale 1-2 puffs into the lungs every 6 (six) hours as needed for wheezing or shortness of breath. 1 Inhaler 0  . benzonatate (TESSALON) 100 MG capsule Take 1 capsule (100 mg total) by mouth 3 (three) times daily as needed for cough. 21 capsule 0  . clonazePAM (KLONOPIN) 1 MG tablet Take 1 mg by mouth 2 (two) times daily as needed for anxiety.     . famotidine (PEPCID) 20 MG tablet Take 1 tablet (20 mg total) by mouth 2 (two) times daily. 30 tablet 0  . fluticasone (FLONASE) 50 MCG/ACT nasal spray Place 2 sprays into both nostrils daily for 7 days. 1 g 2  . ibuprofen (ADVIL,MOTRIN) 600 MG tablet TAKE 1 TABLET BY MOUTH EVERY 6 HOURS AS NEEDED FOR PAIN 30 tablet 5  . LO LOESTRIN FE 1 MG-10 MCG / 10 MCG tablet Take 1 tablet  by mouth daily. 3 Package 4  . loratadine (CLARITIN) 10 MG tablet Take 1 tablet (10 mg total) by mouth daily. 30 tablet 0  . ondansetron (ZOFRAN ODT) 4 MG disintegrating tablet Take 1 tablet (4 mg total) by mouth every 8 (eight) hours as needed for nausea or vomiting. 6 tablet 0   No current facility-administered medications for this visit.    Review of Systems Review of Systems Constitutional: negative for fatigue and weight loss Respiratory: negative for cough and wheezing Cardiovascular: negative for chest pain, fatigue and palpitations Gastrointestinal: positive for abdominal pain and negative for change in bowel habits Genitourinary:negative Integument/breast: negative for nipple discharge Musculoskeletal:negative for myalgias Neurological: negative for gait problems and tremors Behavioral/Psych: negative for abusive relationship, depression Endocrine: negative for temperature intolerance      Blood pressure 116/66, pulse 80, weight 216 lb (98 kg), last menstrual period 09/07/2019.  Physical Exam Physical Exam General:    alert and no distress  Skin:   no rash or abnormalities  Lungs:   clear to auscultation bilaterally  Heart:   regular rate and rhythm, S1, S2 normal, no murmur, click, rub or gallop     Abdomen:  normal findings: no organomegaly, soft, non-tender and no hernia  50% of 15 min visit spent on counseling and coordination of care.   Data Reviewed Labs CT of Abdomen / Pelvis CT Abdomen Pelvis W Contrast (Accession [id number]) (Order [id number]) Imaging Date: 09/16/2019 Department: [facility] EMERGENCY DEPARTMENT Released By/Authorizing: Desma Mcgregor (auto-released)  Exam Status  Status  Final [99]  PACS Intelerad Image Link  Show images for CT Abdomen Pelvis W Contrast  Study Result  CLINICAL DATA:  Abdominal pain.  EXAM: CT ABDOMEN AND PELVIS WITH CONTRAST  TECHNIQUE: Multidetector CT imaging of the abdomen and pelvis was performed using the standard protocol following bolus administration of intravenous contrast.  CONTRAST:  113mL OMNIPAQUE IOHEXOL 300 MG/ML  SOLN  COMPARISON:  None.  FINDINGS: Lower chest: No acute abnormality.  Hepatobiliary: No focal liver abnormality is seen. Ill-defined gallstones are seen within the lumen of an otherwise normal-appearing gallbladder.  Pancreas: Unremarkable. No pancreatic ductal dilatation or surrounding inflammatory changes.  Spleen: Normal in size without focal abnormality.  Adrenals/Urinary Tract: Adrenal glands are unremarkable. Kidneys are normal, without renal calculi, focal lesion, or hydronephrosis. Bladder is unremarkable.  Stomach/Bowel: There is mild thickening of the gastric antrum and proximal portion of the duodenum. The appendix is not clearly identified. No evidence of bowel dilatation.  Vascular/Lymphatic: No significant vascular findings are present. No enlarged abdominal or pelvic lymph nodes.  Reproductive: A 2.8 cm x 2.1 cm well-defined area of  increased attenuation is seen within the posterior aspect of the uterus, slightly to the right of midline (axial CT images 59 through 64, CT series number 3).  A 1.3 cm posterior right adnexa cyst is seen.  Other: No abdominal wall hernia or abnormality. No abdominopelvic ascites.  Musculoskeletal: No acute or significant osseous findings.  IMPRESSION: 1. Cholelithiasis. 2. Mild thickening of the gastric antrum and proximal duodenum which may represent sequelae associated with mild gastritis and/or duodenitis. 3. Hyperdense area within the posterior aspect of the uterus, as described above. The presence of a vascular uterine mass cannot be excluded. Correlation with pelvic ultrasound is recommended. 4. Small right adnexal cyst, likely ovarian in origin.   Electronically Signed   By: Virgina Norfolk M.D.   On: 09/16/2019 20:25      Assessment  1. Generalized abdominal pain - resolved  2. Pelvic pain Rx: - US PELVIC COMPLETE WITH TRANSVAGINAL; Future    Plan    Follow up in 2 weeks  Orders Placed This Encounter  Procedures  . US PELVIC COMPLETE WITH TRANSVAGINAL    Standing Status:   Future    Standing Expiration Date:   11/22/2020    Order Specific Question:   Reason for Exam (SYMPTOM  OR DIAGNOSIS REQUIRED)    Answer:   Pelvic pain    Order Specific Question:   Preferred imaging location?    Answer:   [place], MD 09/25/2019 10:14 AM

## 2019-10-02 ENCOUNTER — Ambulatory Visit (HOSPITAL_COMMUNITY)
Admission: RE | Admit: 2019-10-02 | Discharge: 2019-10-02 | Disposition: A | Discharge status: Home / Self Care | Payer: Medicaid Other | Source: Ambulatory Visit | Attending: Obstetrics | Admitting: Obstetrics

## 2019-10-02 ENCOUNTER — Other Ambulatory Visit: Payer: Self-pay

## 2019-10-02 DIAGNOSIS — R102 Pelvic and perineal pain: Secondary | ICD-10-CM | POA: Insufficient documentation

## 2019-10-09 ENCOUNTER — Encounter: Payer: Self-pay | Admitting: Obstetrics

## 2019-10-09 ENCOUNTER — Telehealth (INDEPENDENT_AMBULATORY_CARE_PROVIDER_SITE_OTHER): Payer: Medicaid Other | Admitting: Obstetrics

## 2019-10-09 DIAGNOSIS — R1084 Generalized abdominal pain: Secondary | ICD-10-CM

## 2019-10-09 NOTE — Progress Notes (Signed)
Pt is on the phone preparing for virtual visit with provider to follow up on [place] results. Pt was seen in office on 09/25/19 with complaints of abdominal pain.

## 2019-10-09 NOTE — Progress Notes (Signed)
TELEHEALTH GYNECOLOGY VIRTUAL VIDEO VISIT ENCOUNTER NOTE  Provider location: [facility] for [facility] at [place]   I connected with Alicia [name] on 10/09/19 at 10:15 AM EST by Gyn MyChart Video Encounter at home and verified that I am speaking with the correct person using two identifiers.   I discussed the limitations, risks, security and privacy concerns of performing an evaluation and management service virtually and the availability of in person appointments. I also discussed with the patient that there may be a patient responsible charge related to this service. The patient expressed understanding and agreed to proceed.   History:  Alicia [name] is a 39 y.o. [phone] female being evaluated today for abdominal pain. She denies any abnormal vaginal discharge, bleeding, pelvic pain or other concerns.       Past Medical History:  Diagnosis Date  . Anxiety   . Bladder infection   . Complication of anesthesia    Epidural did not work w/2nd preg. labor  . Depression    No med. currently, Stopped with + UPT  . Depression 06/27/2012  . Gonorrhea   . Hemorrhoids 06/27/2012  . Osteoarthritis 06/27/2012  . Panic disorder 06/27/2012   Past Surgical History:  Procedure Laterality Date  . BREAST MASS EXCISION     Benign  . CESAREAN SECTION     X 1 1st preg., then VBAC  . CESAREAN SECTION N/A 01/27/2013   Procedure: CESAREAN SECTION;  Surgeon: Shelly Bombard, MD;  Location: [facility];  Service: Obstetrics;  Laterality: N/A;  . MOUTH SURGERY     The following portions of the patient's history were reviewed and updated as appropriate: allergies, current medications, past family history, past medical history, past social history, past surgical history and problem list.   Health Maintenance:  Normal pap and negative HRHPV on 08-23-2016.    Review of Systems:  Pertinent items noted in HPI and remainder of comprehensive ROS otherwise negative.  Physical Exam:   General:  Alert,  oriented and cooperative. Patient appears to be in no acute distress.  Mental Status: Normal mood and affect. Normal behavior. Normal judgment and thought content.   Respiratory: Normal respiratory effort, no problems with respiration noted  Rest of physical exam deferred due to type of encounter  Labs and Imaging No results found for this or any previous visit (from the past 336 hour(s)). CT Abdomen Pelvis W Contrast  Result Date: 09/16/2019 CLINICAL DATA:  Abdominal pain. EXAM: CT ABDOMEN AND PELVIS WITH CONTRAST TECHNIQUE: Multidetector CT imaging of the abdomen and pelvis was performed using the standard protocol following bolus administration of intravenous contrast. CONTRAST:  155mL OMNIPAQUE IOHEXOL 300 MG/ML  SOLN COMPARISON:  None. FINDINGS: Lower chest: No acute abnormality. Hepatobiliary: No focal liver abnormality is seen. Ill-defined gallstones are seen within the lumen of an otherwise normal-appearing gallbladder. Pancreas: Unremarkable. No pancreatic ductal dilatation or surrounding inflammatory changes. Spleen: Normal in size without focal abnormality. Adrenals/Urinary Tract: Adrenal glands are unremarkable. Kidneys are normal, without renal calculi, focal lesion, or hydronephrosis. Bladder is unremarkable. Stomach/Bowel: There is mild thickening of the gastric antrum and proximal portion of the duodenum. The appendix is not clearly identified. No evidence of bowel dilatation. Vascular/Lymphatic: No significant vascular findings are present. No enlarged abdominal or pelvic lymph nodes. Reproductive: A 2.8 cm x 2.1 cm well-defined area of increased attenuation is seen within the posterior aspect of the uterus, slightly to the right of midline (axial CT images 59 through 64, CT series number 3). A  1.3 cm posterior right adnexa cyst is seen. Other: No abdominal wall hernia or abnormality. No abdominopelvic ascites. Musculoskeletal: No acute or significant osseous findings. IMPRESSION: 1.  Cholelithiasis. 2. Mild thickening of the gastric antrum and proximal duodenum which may represent sequelae associated with mild gastritis and/or duodenitis. 3. Hyperdense area within the posterior aspect of the uterus, as described above. The presence of a vascular uterine mass cannot be excluded. Correlation with pelvic ultrasound is recommended. 4. Small right adnexal cyst, likely ovarian in origin. Electronically Signed   By: Virgina Norfolk M.D.   On: 09/16/2019 20:25   US PELVIC COMPLETE WITH TRANSVAGINAL  Result Date: 10/02/2019 CLINICAL DATA:  Pelvic pain.  Uterine mass on recent CT. EXAM: TRANSABDOMINAL AND TRANSVAGINAL ULTRASOUND OF PELVIS TECHNIQUE: Both transabdominal and transvaginal ultrasound examinations of the pelvis were performed. Transabdominal technique was performed for global imaging of the pelvis including uterus, ovaries, adnexal regions, and pelvic cul-de-sac. It was necessary to proceed with endovaginal exam following the transabdominal exam to visualize the endometrium and ovaries. COMPARISON:  CT on 09/16/2019 FINDINGS: Uterus Measurements: 13.7 x 5.3 x 6.8 cm = volume: 257 mL. Diffusely heterogeneous echogenicity myometrium noted. Several uterine fibroids are seen, largest in right lower uterine corpus measuring 2.7 cm in maximum diameter. Endometrium Thickness: 4 mm.  No focal abnormality visualized. Right ovary Measurements: 2.9 x 1.8 x 2.4 cm = volume: 6.5 mL. Normal appearance/no adnexal mass. Left ovary Measurements: 2.5 x 2.5 x 2.2 cm = volume: 7.4 mL. Normal appearance/no adnexal mass. Other findings No abnormal free fluid. IMPRESSION: Several small uterine fibroids, largest measuring 2.7 cm. Normal appearance of both ovaries.  No adnexal mass visualized. Electronically Signed   By: Marlaine Hind M.D.   On: 10/02/2019 11:49       Assessment and Plan:     1. Generalized abdominal pain - pelvic ultrasound is WNL's - R/O gastritis.  GI appointment scheduled next wk.   I  discussed the assessment and treatment plan with the patient. The patient was provided an opportunity to ask questions and all were answered. The patient agreed with the plan and demonstrated an understanding of the instructions.   The patient was advised to call back or seek an in-person evaluation/go to the ED if the symptoms worsen or if the condition fails to improve as anticipated.  I provided 15 minutes of face-to-face time during this encounter.   Baltazar Najjar, MD [facility] for [facility] 10/09/2019

## 2019-11-14 ENCOUNTER — Inpatient Hospital Stay
Admission: RE | Admit: 2019-11-14 | Discharge: 2019-11-14 | Disposition: A | Discharge status: Home / Self Care | Payer: Medicaid Other | Source: Ambulatory Visit

## 2019-11-15 ENCOUNTER — Inpatient Hospital Stay
Admission: RE | Admit: 2019-11-15 | Discharge: 2019-11-15 | Disposition: A | Discharge status: Home / Self Care | Payer: Medicaid Other | Source: Ambulatory Visit

## 2019-11-16 ENCOUNTER — Ambulatory Visit
Admission: RE | Admit: 2019-11-16 | Discharge: 2019-11-16 | Disposition: A | Discharge status: Home / Self Care | Payer: Medicaid Other | Source: Ambulatory Visit

## 2019-11-16 ENCOUNTER — Ambulatory Visit (INDEPENDENT_AMBULATORY_CARE_PROVIDER_SITE_OTHER)
Admission: EM | Admit: 2019-11-16 | Discharge: 2019-11-16 | Disposition: A | Discharge status: Home / Self Care | Payer: Medicaid Other | Source: Home / Self Care

## 2019-11-16 DIAGNOSIS — H9201 Otalgia, right ear: Secondary | ICD-10-CM | POA: Diagnosis not present

## 2019-11-16 MED ORDER — AZITHROMYCIN 250 MG PO TABS: 250.0000 mg | ORAL_TABLET | Freq: Every day | ORAL | 0 refills | Status: DC

## 2019-11-16 NOTE — ED Provider Notes (Signed)
Virtual Visit via Video Note:  Alicia [name]  initiated request for Telemedicine visit with [facility] team. I connected with Alicia [name]  on 11/16/2019 at 11:31 AM  for a synchronized telemedicine visit using a video enabled HIPPA compliant telemedicine application. I verified that I am speaking with Alicia [name]  using two identifiers. Sharion Balloon, NP  was physically located in a [facility] Urgent care site and Arrianne Cansler was located at a different location.   The limitations of evaluation and management by telemedicine as well as the availability of in-person appointments were discussed. Patient was informed that she  may incur a bill ( including co-pay) for this virtual visit encounter. Dominique Sthilaire  expressed understanding and gave verbal consent to proceed with virtual visit.     History of Present Illness:Alicia [name]  is a 39 y.o. female presents for evaluation of right earache x 1 week.  No treatment attempted at home.  Denies fever, chills, sore throat, cough, shortness of breath, vomiting, diarrhea, rash, or other symptoms.  She states last antibiotic use several months ago.     Allergies  Allergen Reactions  . Penicillins      Past Medical History:  Diagnosis Date  . Anxiety   . Bladder infection   . Complication of anesthesia    Epidural did not work w/2nd preg. labor  . Depression    No med. currently, Stopped with + UPT  . Depression 06/27/2012  . Gonorrhea   . Hemorrhoids 06/27/2012  . Osteoarthritis 06/27/2012  . Panic disorder 06/27/2012     Social History   Tobacco Use  . Smoking status: Current Every Day Smoker    Packs/day: 1.00    Types: Cigarettes    Last attempt to quit: 06/22/2005    Years since quitting: 14.4  . Smokeless tobacco: Never Used  Substance Use Topics  . Alcohol use: No    Comment: Every other month  . Drug use: No    ROS: as stated in HPI.  All other systems reviewed and negative.       Observations/Objective: Physical Exam  VITALS: Patient denies fever. GENERAL: Alert, appears well and in no acute distress. HEENT: Atraumatic. NECK: Normal movements of the head and neck. CARDIOPULMONARY: No increased WOB. Speaking in clear sentences. I:E ratio WNL.  MS: Moves all visible extremities without noticeable abnormality. PSYCH: Pleasant and cooperative, well-groomed. Speech normal rate and rhythm. Affect is appropriate. Insight and judgement are appropriate. Attention is focused, linear, and appropriate.  NEURO: CN grossly intact. Oriented as arrived to appointment on time with no prompting. Moves both UE equally.  SKIN: No obvious lesions, wounds, erythema, or cyanosis noted on face or hands.   Assessment and Plan:    ICD-10-CM   1. Otalgia of right ear  H92.01        Follow Up Instructions: Treating with Zithromax (allergy to PCN).  Tylenol or ibuprofen as needed for discomfort.  Instructed patient to follow-up with her primary care provider or come here to be seen in person if her symptoms are not improving.  Patient agrees to plan of care.      I discussed the assessment and treatment plan with the patient. The patient was provided an opportunity to ask questions and all were answered. The patient agreed with the plan and demonstrated an understanding of the instructions.   The patient was advised to call back or seek an in-person evaluation if the symptoms worsen or if the condition  fails to improve as anticipated.      Sharion Balloon, NP  11/16/2019 11:31 AM         Sharion Balloon, NP 11/16/19 1131

## 2019-11-16 NOTE — Discharge Instructions (Signed)
Take the antibiotic as directed.  Additionally you can take tylenol or ibuprofen for discomfort.    Follow up with your primary care provider or come here to be seen in person if your symptoms are not improving.

## 2019-12-22 ENCOUNTER — Telehealth: Payer: Medicaid Other

## 2019-12-22 ENCOUNTER — Other Ambulatory Visit: Payer: Self-pay

## 2020-01-26 ENCOUNTER — Other Ambulatory Visit: Payer: Self-pay

## 2020-01-26 ENCOUNTER — Ambulatory Visit: Payer: Medicaid Other | Admitting: Physician Assistant

## 2020-01-26 VITALS — BP 121/82 | HR 85 | Temp 98.2°F | Resp 18 | Ht 62.0 in | Wt 213.0 lb

## 2020-01-26 DIAGNOSIS — F319 Bipolar disorder, unspecified: Secondary | ICD-10-CM | POA: Diagnosis not present

## 2020-01-26 DIAGNOSIS — F411 Generalized anxiety disorder: Secondary | ICD-10-CM

## 2020-01-26 DIAGNOSIS — K5909 Other constipation: Secondary | ICD-10-CM

## 2020-01-26 DIAGNOSIS — G8929 Other chronic pain: Secondary | ICD-10-CM

## 2020-01-26 DIAGNOSIS — R1013 Epigastric pain: Secondary | ICD-10-CM

## 2020-01-26 DIAGNOSIS — K219 Gastro-esophageal reflux disease without esophagitis: Secondary | ICD-10-CM | POA: Diagnosis not present

## 2020-01-26 DIAGNOSIS — Z13220 Encounter for screening for lipoid disorders: Secondary | ICD-10-CM

## 2020-01-26 MED ORDER — HYDROXYZINE HCL 10 MG PO TABS: 10.0000 mg | ORAL_TABLET | Freq: Three times a day (TID) | ORAL | 0 refills | Status: DC | PRN

## 2020-01-26 MED ORDER — ARIPIPRAZOLE 2 MG PO TABS: 2.0000 mg | ORAL_TABLET | Freq: Every day | ORAL | 2 refills | Status: DC

## 2020-01-26 MED ORDER — PANTOPRAZOLE SODIUM 40 MG PO TBEC: 40.0000 mg | DELAYED_RELEASE_TABLET | Freq: Every day | ORAL | 3 refills | Status: DC

## 2020-01-26 MED ORDER — PAROXETINE HCL 10 MG PO TABS: 10.0000 mg | ORAL_TABLET | Freq: Every day | ORAL | 0 refills | Status: DC

## 2020-01-26 NOTE — Patient Instructions (Signed)
For your abdominal pain, I am testing you for a stomach infection called H. pylori, I recommend that you start Protonix, you will take this in the morning prior to eating anything, you will take this instead of the Pepcid.  I also recommend that you follow a gentle diet, increase your water intake, use MiraLAX over-the-counter to help with your bowel movements.  For your anxiety, I am refilling your Paxil at 10 mg, please return to the mobile unit in 2 weeks or send a message through my chart for a refill to increase to 20 mg.  Please continue to keep your follow-up appointment with your psychiatrist.  I also am sending a medication called hydroxyzine, you can use this 3 times a day to help you with anxiety, and you can use it prior to bed to help with insomnia.  Thank you for trusting [place] with your care, I hope that you feel better soon.  Kennieth Rad, PA-C Physician Assistant [facility] [email or web address]    Irritable Bowel Syndrome, Adult  Irritable bowel syndrome (IBS) is a group of symptoms that affects the organs responsible for digestion (gastrointestinal or GI tract). IBS is not one specific disease. To regulate how the GI tract works, the body sends signals back and forth between the intestines and the brain. If you have IBS, there may be a problem with these signals. As a result, the GI tract does not function normally. The intestines may become more sensitive and overreact to certain things. This may be especially true when you eat certain foods or when you are under stress. There are four types of IBS. These may be determined based on the consistency of your stool (feces):  IBS with diarrhea.  IBS with constipation.  Mixed IBS.  Unsubtyped IBS. It is important to know which type of IBS you have. Certain treatments are more likely to be helpful for certain types of IBS. What are the causes? The exact cause of IBS is not  known. What increases the risk? You may have a higher risk for IBS if you:  Are female.  Are younger than 39.  Have a family history of IBS.  Have a mental health condition, such as depression, anxiety, or post-traumatic stress disorder.  Have had a bacterial infection of your GI tract. What are the signs or symptoms? Symptoms of IBS vary from person to person. The main symptom is abdominal pain or discomfort. Other symptoms usually include one or more of the following:  Diarrhea, constipation, or both.  Abdominal swelling or bloating.  Feeling full after eating a small or regular-sized meal.  Frequent gas.  Mucus in the stool.  A feeling of having more stool left after a bowel movement. Symptoms tend to come and go. They may be triggered by stress, mental health conditions, or certain foods. How is this diagnosed? This condition may be diagnosed based on a physical exam, your medical history, and your symptoms. You may have tests, such as:  Blood tests.  Stool test.  X-rays.  CT scan.  Colonoscopy. This is a procedure in which your GI tract is viewed with a long, thin, flexible tube. How is this treated? There is no cure for IBS, but treatment can help relieve symptoms. Treatment depends on the type of IBS you have, and may include:  Changes to your diet, such as: ? Avoiding foods that cause symptoms. ? Drinking more water. ? Following a low-FODMAP (fermentable oligosaccharides, disaccharides, monosaccharides, and polyols) diet for  up to 6 weeks, or as told by your health care provider. FODMAPs are sugars that are hard for some people to digest. ? Eating more fiber. ? Eating medium-sized meals at the same times every day.  Medicines. These may include: ? Fiber supplements, if you have constipation. ? Medicine to control diarrhea (antidiarrheal medicines). ? Medicine to help control muscle tightening (spasms) in your GI tract (antispasmodic  medicines). ? Medicines to help with mental health conditions, such as antidepressants or tranquilizers.  Talk therapy or counseling.  Working with a diet and nutrition specialist (dietitian) to help create a food plan that is right for you.  Managing your stress. Follow these instructions at home: Eating and drinking  Eat a healthy diet.  Eat medium-sized meals at about the same time every day. Do not eat large meals.  Gradually eat more fiber-rich foods. These include whole grains, fruits, and vegetables. This may be especially helpful if you have IBS with constipation.  Eat a diet low in FODMAPs.  Drink enough fluid to keep your urine pale yellow.  Keep a journal of foods that seem to trigger symptoms.  Avoid foods and drinks that: ? Contain added sugar. ? Make your symptoms worse. Dairy products, caffeinated drinks, and carbonated drinks can make symptoms worse for some people. General instructions  Take over-the-counter and prescription medicines and supplements only as told by your health care provider.  Get enough exercise. Do at least 150 minutes of moderate-intensity exercise each week.  Manage your stress. Getting enough sleep and exercise can help you manage stress.  Keep all follow-up visits as told by your health care provider and therapist. This is important. Alcohol Use  Do not drink alcohol if: ? Your health care provider tells you not to drink. ? You are pregnant, may be pregnant, or are planning to become pregnant.  If you drink alcohol, limit how much you have: ? 0-1 drink a day for women. ? 0-2 drinks a day for men.  Be aware of how much alcohol is in your drink. In the [place] one drink equals one typical bottle of beer (12 oz), one-half glass of wine (5 oz), or one shot of hard liquor (1 oz). Contact a health care provider if you have:  Constant pain.  Weight loss.  Difficulty or pain when swallowing.  Diarrhea that gets worse. Get help  right away if you have:  Severe abdominal pain.  Fever.  Diarrhea with symptoms of dehydration, such as dizziness or dry mouth.  Bright red blood in your stool.  Stool that is black and tarry.  Abdominal swelling.  Vomiting that does not stop.  Blood in your vomit. Summary  Irritable bowel syndrome (IBS) is not one specific disease. It is a group of symptoms that affects digestion.  Your intestines may become more sensitive and overreact to certain things. This may be especially true when you eat certain foods or when you are under stress.  There is no cure for IBS, but treatment can help relieve symptoms. This information is not intended to replace advice given to you by your health care provider. Make sure you discuss any questions you have with your health care provider. Document Revised: 08/06/2017 Document Reviewed: 08/06/2017 Elsevier Patient Education  2020 [employer].

## 2020-01-26 NOTE — Progress Notes (Signed)
New Patient Office Visit  Subjective:  Patient ID: Alicia [name], female    DOB: March 08, 1981  Age: 39 y.o. MRN: [id number]  CC:  Chief Complaint  Patient presents with  . Abdominal Pain    HPI Alicia [name] presents for abdominal pain and anxiety.  Reports that she has been experiencing abdominal pain for the last couple of months, describes it mostly as epigastric, burning and cramping.  Reports that it is generally worse after eating, does seem to improve with a bowel movement.  Reports that she has been taking Pepcid prior to meals with some relief, will use ibuprofen or BC powders to help with the pain.  Endorses that she is lactose intolerant, is unable to drink milk, can eat a small amount of cheese.  Reports that she will be constipated, bm every 4-5 days, will be loose to normal soft formed.  Has not tried anything to help her regulate her bowel movements.  Does not drink much water.  Reports that she has been seen at the emergency department several times for this complaint, was told that she had uterine fibroids, did follow-up with gynecology who prescribed her birth control pills, she has opted not to start these.  She was also encouraged to follow-up with gastroenterology, states that she was never given an appointment.  Reports that she is treated for bipolar disorder and generalized anxiety disorder, states that she has been unable to be seen by her psychiatrist, has upcoming appointment but not until August.  Reports that she has been out of her medications for the last several months.  Reports that her anxiety is elevated, suffers from several panic attacks each week, states the only thing that helps is making herself go to sleep.  Was previously taking Abilify, Paxil, Klonopin, and states that she previously took Ambien but it has been several years.  Reports that she has difficulty falling asleep.  Reports that she was also receiving counseling services from her  psychiatrist.    Past Medical History:  Diagnosis Date  . Anxiety   . Bladder infection   . Complication of anesthesia    Epidural did not work w/2nd preg. labor  . Depression    No med. currently, Stopped with + UPT  . Depression 06/27/2012  . Gonorrhea   . Hemorrhoids 06/27/2012  . Osteoarthritis 06/27/2012  . Panic disorder 06/27/2012    Past Surgical History:  Procedure Laterality Date  . BREAST MASS EXCISION     Benign  . CESAREAN SECTION     X 1 1st preg., then VBAC  . CESAREAN SECTION N/A 01/27/2013   Procedure: CESAREAN SECTION;  Surgeon: Shelly Bombard, MD;  Location: [facility];  Service: Obstetrics;  Laterality: N/A;  . MOUTH SURGERY      Family History  Problem Relation Age of Onset  . Cancer Mother   . Other Neg Hx   . Alcohol abuse Neg Hx   . Arthritis Neg Hx   . Asthma Neg Hx   . Birth defects Neg Hx   . COPD Neg Hx   . Depression Neg Hx   . Drug abuse Neg Hx   . Diabetes Neg Hx   . Early death Neg Hx   . Hearing loss Neg Hx   . Heart disease Neg Hx   . Hyperlipidemia Neg Hx   . Hypertension Neg Hx   . Kidney disease Neg Hx   . Learning disabilities Neg Hx   . Mental illness Neg Hx   .  Mental retardation Neg Hx   . Miscarriages / Stillbirths Neg Hx   . Stroke Neg Hx   . Vision loss Neg Hx     Social History   Socioeconomic History  . Marital status: Widowed    Spouse name: Not on file  . Number of children: Not on file  . Years of education: Not on file  . Highest education level: Not on file  Occupational History  . Not on file  Tobacco Use  . Smoking status: Current Every Day Smoker    Packs/day: 1.00    Types: Cigarettes    Last attempt to quit: 06/22/2005    Years since quitting: 14.6  . Smokeless tobacco: Never Used  Substance and Sexual Activity  . Alcohol use: No    Comment: Every other month  . Drug use: No  . Sexual activity: Yes    Birth control/protection: None    Comment: Last intercourse at least 1 week ago  Other  Topics Concern  . Not on file  Social History Narrative  . Not on file   Social Determinants of Health   Financial Resource Strain:   . Difficulty of Paying Living Expenses:   Food Insecurity:   . Worried About Charity fundraiser in the Last Year:   . Arboriculturist in the Last Year:   Transportation Needs:   . Film/video editor (Medical):   Marland Kitchen Lack of Transportation (Non-Medical):   Physical Activity:   . Days of Exercise per Week:   . Minutes of Exercise per Session:   Stress:   . Feeling of Stress :   Social Connections:   . Frequency of Communication with Friends and Family:   . Frequency of Social Gatherings with Friends and Family:   . Attends Religious Services:   . Active Member of Clubs or Organizations:   . Attends Archivist Meetings:   Marland Kitchen Marital Status:   Intimate Partner Violence:   . Fear of Current or Ex-Partner:   . Emotionally Abused:   Marland Kitchen Physically Abused:   . Sexually Abused:     ROS Review of Systems  Constitutional: Negative for chills and fever.  HENT: Negative.   Eyes: Negative.   Respiratory: Negative.   Cardiovascular: Negative.   Gastrointestinal: Positive for abdominal pain and constipation. Negative for blood in stool, nausea and vomiting.  Endocrine: Negative.   Genitourinary: Negative.   Musculoskeletal: Negative.   Skin: Negative.   Allergic/Immunologic: Negative.   Neurological: Negative.   Hematological: Negative.   Psychiatric/Behavioral: Positive for dysphoric mood and sleep disturbance. Negative for self-injury and suicidal ideas. The patient is nervous/anxious.     Objective:   Today's Vitals: BP 121/82 (BP Location: Left Arm, Patient Position: Sitting, Cuff Size: Large)   Pulse 85   Temp 98.2 F (36.8 C) (Oral)   Resp 18   Ht 5\' 2"  (1.575 m)   Wt 213 lb (96.6 kg)   LMP 01/09/2020   SpO2 99%   BMI 38.96 kg/m   Physical Exam Vitals and nursing note reviewed.  Constitutional:      General: She is  not in acute distress.    Appearance: She is well-developed. She is obese. She is not ill-appearing.  HENT:     Head: Normocephalic and atraumatic.     Mouth/Throat:     Mouth: Mucous membranes are moist.     Pharynx: Oropharynx is clear.  Eyes:     Extraocular Movements: Extraocular movements intact.  Pupils: Pupils are equal, round, and reactive to light.  Cardiovascular:     Rate and Rhythm: Normal rate and regular rhythm.     Heart sounds: Normal heart sounds.  Pulmonary:     Effort: Pulmonary effort is normal.     Breath sounds: Normal breath sounds.  Abdominal:     General: Bowel sounds are normal.     Tenderness: There is abdominal tenderness in the epigastric area and suprapubic area.  Skin:    General: Skin is warm and dry.  Neurological:     General: No focal deficit present.     Mental Status: She is alert and oriented to person, place, and time.  Psychiatric:        Mood and Affect: Mood normal.        Behavior: Behavior normal.     Assessment & Plan:   Problem List Items Addressed This Visit      Digestive   GERD without esophagitis   Relevant Medications   pantoprazole (PROTONIX) 40 MG tablet   Other Relevant Orders   H Pylori, IGM, IGG, IGA AB    Other Visit Diagnoses    Generalized anxiety disorder    -  Primary   Relevant Medications   PARoxetine (PAXIL) 10 MG tablet   hydrOXYzine (ATARAX/VISTARIL) 10 MG tablet   Other Relevant Orders   TSH   Vitamin D, 25-hydroxy   Bipolar depression (HCC)       Relevant Medications   ARIPiprazole (ABILIFY) 2 MG tablet   Other constipation       Screening, lipid       Relevant Orders   Lipid panel   Abdominal pain, chronic, epigastric       Relevant Medications   PARoxetine (PAXIL) 10 MG tablet   Other Relevant Orders   CBC with Differential/Platelet   Comp. Metabolic Panel (12)      Outpatient Encounter Medications as of 01/26/2020  Medication Sig  . albuterol (PROVENTIL HFA;VENTOLIN HFA) 108 (90  Base) MCG/ACT inhaler Inhale 1-2 puffs into the lungs every 6 (six) hours as needed for wheezing or shortness of breath.  . clonazePAM (KLONOPIN) 1 MG tablet Take 1 mg by mouth 2 (two) times daily as needed for anxiety.   . famotidine (PEPCID) 20 MG tablet Take 1 tablet (20 mg total) by mouth 2 (two) times daily.  . [DISCONTINUED] ibuprofen (ADVIL,MOTRIN) 600 MG tablet TAKE 1 TABLET BY MOUTH EVERY 6 HOURS AS NEEDED FOR PAIN  . [DISCONTINUED] loratadine (CLARITIN) 10 MG tablet Take 1 tablet (10 mg total) by mouth daily.  . ARIPiprazole (ABILIFY) 2 MG tablet Take 1 tablet (2 mg total) by mouth daily.  . hydrOXYzine (ATARAX/VISTARIL) 10 MG tablet Take 1 tablet (10 mg total) by mouth 3 (three) times daily as needed.  . LO LOESTRIN FE 1 MG-10 MCG / 10 MCG tablet Take 1 tablet by mouth daily. (Patient not taking: Reported on 01/26/2020)  . pantoprazole (PROTONIX) 40 MG tablet Take 1 tablet (40 mg total) by mouth daily.  Marland Kitchen PARoxetine (PAXIL) 10 MG tablet Take 1 tablet (10 mg total) by mouth daily.  . [DISCONTINUED] azithromycin (ZITHROMAX) 250 MG tablet Take 1 tablet (250 mg total) by mouth daily. Take first 2 tablets together, then 1 every day until finished.  . [DISCONTINUED] benzonatate (TESSALON) 100 MG capsule Take 1 capsule (100 mg total) by mouth 3 (three) times daily as needed for cough.  . [DISCONTINUED] cyclobenzaprine (FLEXERIL) 10 MG tablet Take 1 tablet (10 mg  total) by mouth 2 (two) times daily as needed for muscle spasms.  . [DISCONTINUED] fluticasone (FLONASE) 50 MCG/ACT nasal spray Place 2 sprays into both nostrils daily for 7 days.  . [DISCONTINUED] ondansetron (ZOFRAN ODT) 4 MG disintegrating tablet Take 1 tablet (4 mg total) by mouth every 8 (eight) hours as needed for nausea or vomiting. (Patient not taking: Reported on 10/09/2019)   No facility-administered encounter medications on file as of 01/26/2020.  1. Generalized anxiety disorder Patient reported Paxil was 20 mg, given that she has  been out for several months, will restarted 10 mg, patient given instructions to follow-up in mobile unit in 2 weeks or send message through [place] reporting on current status, will increase to 20 mg at that time if needed.  Trial hydroxyzine 10 mg 3 times a day as needed for anxiety and insomnia.  Encouraged to follow-up with psychiatry appointment   - PARoxetine (PAXIL) 10 MG tablet; Take 1 tablet (10 mg total) by mouth daily.  Dispense: 30 tablet; Refill: 0 - hydrOXYzine (ATARAX/VISTARIL) 10 MG tablet; Take 1 tablet (10 mg total) by mouth 3 (three) times daily as needed.  Dispense: 30 tablet; Refill: 0 - TSH - Vitamin D, 25-hydroxy  2. Bipolar depression ([place]) Patient was unsure of dosing of Abilify, stated it was a low dose, will restart at 2 mg.  - ARIPiprazole (ABILIFY) 2 MG tablet; Take 1 tablet (2 mg total) by mouth daily.  Dispense: 30 tablet; Refill: 2  3. GERD without esophagitis Trial Protonix, encouraged bland diet, gave patient education on avoiding ibuprofen, BC powders. - pantoprazole (PROTONIX) 40 MG tablet; Take 1 tablet (40 mg total) by mouth daily.  Dispense: 30 tablet; Refill: 3 - H Pylori, IGM, IGG, IGA AB  4. Other constipation Encouraged increasing hydration, trial MiraLAX over-the-counter, increase fiber  5. Screening, lipid  - Lipid panel  6. Abdominal pain, chronic, epigastric Patient has recently had CT abdomen, transvaginal ultrasound     IMPRESSION: Several small uterine fibroids, largest measuring 2.7 cm.  Normal appearance of both ovaries.  No adnexal mass visualized.   Electronically Signed   By: Marlaine Hind M.D.   On: 10/02/2019 11:49   IMPRESSION: 1. Cholelithiasis. 2. Mild thickening of the gastric antrum and proximal duodenum which may represent sequelae associated with mild gastritis and/or duodenitis. 3. Hyperdense area within the posterior aspect of the uterus, as described above. The presence of a vascular uterine mass  cannot be excluded. Correlation with pelvic ultrasound is recommended. 4. Small right adnexal cyst, likely ovarian in origin.   Electronically Signed   By: Virgina Norfolk M.D.   On: 09/16/2019 20:25  - CBC with Differential/Platelet - Comp. Metabolic Panel (12)  Patient reports dissatisfaction with current primary care provider, patient was given appointment to establish primary care at [facility] on 03/11/20  I have reviewed the patient's medical history (PMH, PSH, Social History, Family History, Medications, and allergies) , and have been updated if relevant. I spent 45 minutes reviewing chart and  face to face time with patient.   Follow-up: Return in about 6 weeks (around 03/11/2020) for To establish PCP, with Dr. Juleen China at [place].   Loraine Grip Mayers, PA-C

## 2020-01-28 ENCOUNTER — Encounter: Payer: Self-pay | Admitting: Physician Assistant

## 2020-01-28 ENCOUNTER — Other Ambulatory Visit: Payer: Self-pay | Admitting: Physician Assistant

## 2020-01-28 DIAGNOSIS — A048 Other specified bacterial intestinal infections: Secondary | ICD-10-CM

## 2020-01-28 DIAGNOSIS — E559 Vitamin D deficiency, unspecified: Secondary | ICD-10-CM

## 2020-01-28 LAB — CBC WITH DIFFERENTIAL/PLATELET
Basophils Absolute: 0 10*3/uL (ref 0.0–0.2)
Basos: 1 %
EOS (ABSOLUTE): 0.1 10*3/uL (ref 0.0–0.4)
Eos: 2 %
Hematocrit: 40.6 % (ref 34.0–46.6)
Hemoglobin: 12.9 g/dL (ref 11.1–15.9)
Immature Grans (Abs): 0 10*3/uL (ref 0.0–0.1)
Immature Granulocytes: 0 %
Lymphocytes Absolute: 2.9 10*3/uL (ref 0.7–3.1)
Lymphs: 46 %
MCH: 25.9 pg — ABNORMAL LOW (ref 26.6–33.0)
MCHC: 31.8 g/dL (ref 31.5–35.7)
MCV: 82 fL (ref 79–97)
Monocytes Absolute: 0.4 10*3/uL (ref 0.1–0.9)
Monocytes: 6 %
Neutrophils Absolute: 2.8 10*3/uL (ref 1.4–7.0)
Neutrophils: 45 %
Platelets: 286 10*3/uL (ref 150–450)
RBC: 4.98 x10E6/uL (ref 3.77–5.28)
RDW: 12.6 % (ref 11.7–15.4)
WBC: 6.2 10*3/uL (ref 3.4–10.8)

## 2020-01-28 LAB — COMP. METABOLIC PANEL (12)
AST: 19 IU/L (ref 0–40)
Albumin/Globulin Ratio: 1.2 (ref 1.2–2.2)
Albumin: 4.1 g/dL (ref 3.8–4.8)
Alkaline Phosphatase: 82 IU/L (ref 48–121)
BUN/Creatinine Ratio: 10 (ref 9–23)
BUN: 7 mg/dL (ref 6–20)
Bilirubin Total: 0.8 mg/dL (ref 0.0–1.2)
Calcium: 9.5 mg/dL (ref 8.7–10.2)
Chloride: 103 mmol/L (ref 96–106)
Creatinine, Ser: 0.67 mg/dL (ref 0.57–1.00)
GFR calc Af Amer: 129 mL/min/{1.73_m2} (ref >59)
GFR calc non Af Amer: 112 mL/min/{1.73_m2} (ref >59)
Globulin, Total: 3.4 g/dL (ref 1.5–4.5)
Glucose: 101 mg/dL — ABNORMAL HIGH (ref 65–99)
Potassium: 3.8 mmol/L (ref 3.5–5.2)
Sodium: 139 mmol/L (ref 134–144)
Total Protein: 7.5 g/dL (ref 6.0–8.5)

## 2020-01-28 LAB — LIPID PANEL
Chol/HDL Ratio: 4 ratio (ref 0.0–4.4)
Cholesterol, Total: 188 mg/dL (ref 100–199)
HDL: 47 mg/dL (ref >39)
LDL Chol Calc (NIH): 130 mg/dL — ABNORMAL HIGH (ref 0–99)
Triglycerides: 58 mg/dL (ref 0–149)
VLDL Cholesterol Cal: 11 mg/dL (ref 5–40)

## 2020-01-28 LAB — H PYLORI, IGM, IGG, IGA AB
H pylori, IgM Abs: <9 units (ref 0.0–8.9)
H. pylori, IgA Abs: 14.8 units — ABNORMAL HIGH (ref 0.0–8.9)
H. pylori, IgG AbS: 0.67 Index Value (ref 0.00–0.79)

## 2020-01-28 LAB — TSH: TSH: 1.35 u[IU]/mL (ref 0.450–4.500)

## 2020-01-28 LAB — VITAMIN D 25 HYDROXY (VIT D DEFICIENCY, FRACTURES): Vit D, 25-Hydroxy: 12.5 ng/mL — ABNORMAL LOW (ref 30.0–100.0)

## 2020-01-28 MED ORDER — VITAMIN D (ERGOCALCIFEROL) 1.25 MG (50000 UNIT) PO CAPS: 50000.0000 [IU] | ORAL_CAPSULE | ORAL | 0 refills | Status: DC

## 2020-01-28 MED ORDER — CLARITHROMYCIN 500 MG PO TABS: 500.0000 mg | ORAL_TABLET | Freq: Two times a day (BID) | ORAL | 0 refills | Status: AC

## 2020-01-28 MED ORDER — METRONIDAZOLE 500 MG PO TABS: 500.0000 mg | ORAL_TABLET | Freq: Three times a day (TID) | ORAL | 0 refills | Status: AC

## 2020-02-09 ENCOUNTER — Ambulatory Visit: Payer: Medicaid Other

## 2020-03-01 ENCOUNTER — Ambulatory Visit: Payer: Medicaid Other | Admitting: Physician Assistant

## 2020-03-01 ENCOUNTER — Other Ambulatory Visit: Payer: Self-pay

## 2020-03-01 DIAGNOSIS — F319 Bipolar disorder, unspecified: Secondary | ICD-10-CM

## 2020-03-01 DIAGNOSIS — K219 Gastro-esophageal reflux disease without esophagitis: Secondary | ICD-10-CM

## 2020-03-01 DIAGNOSIS — E559 Vitamin D deficiency, unspecified: Secondary | ICD-10-CM

## 2020-03-01 DIAGNOSIS — F411 Generalized anxiety disorder: Secondary | ICD-10-CM

## 2020-03-01 MED ORDER — HYDROXYZINE HCL 10 MG PO TABS: 10.0000 mg | ORAL_TABLET | Freq: Three times a day (TID) | ORAL | 2 refills | Status: DC | PRN

## 2020-03-01 MED ORDER — PAROXETINE HCL 10 MG PO TABS: 10.0000 mg | ORAL_TABLET | Freq: Every day | ORAL | 2 refills | Status: DC

## 2020-03-01 MED ORDER — VITAMIN D (ERGOCALCIFEROL) 1.25 MG (50000 UNIT) PO CAPS: 50000.0000 [IU] | ORAL_CAPSULE | ORAL | 0 refills | Status: DC

## 2020-03-01 NOTE — Patient Instructions (Signed)
Please resume vitamin D 50,000 units once a week, if the pharmacy is unable to fill this, please take 2,000 units over-the-counter once daily.  Please continue to take Protonix on a daily basis, avoid NSAIDs.  Please let us know if there is anything else that we can do for you  Kennieth Rad, PA-C Physician Assistant [place] [email or web address]    Food Choices for Gastroesophageal Reflux Disease, Adult When you have gastroesophageal reflux disease (GERD), the foods you eat and your eating habits are very important. Choosing the right foods can help ease the discomfort of GERD. Consider working with a diet and nutrition specialist (dietitian) to help you make healthy food choices. What general guidelines should I follow?  Eating plan  Choose healthy foods low in fat, such as fruits, vegetables, whole grains, low-fat dairy products, and lean meat, fish, and poultry.  Eat frequent, small meals instead of three large meals each day. Eat your meals slowly, in a relaxed setting. Avoid bending over or lying down until 2-3 hours after eating.  Limit high-fat foods such as fatty meats or fried foods.  Limit your intake of oils, butter, and shortening to less than 8 teaspoons each day.  Avoid the following: ? Foods that cause symptoms. These may be different for different people. Keep a food diary to keep track of foods that cause symptoms. ? Alcohol. ? Drinking large amounts of liquid with meals. ? Eating meals during the 2-3 hours before bed.  Cook foods using methods other than frying. This may include baking, grilling, or broiling. Lifestyle  Maintain a healthy weight. Ask your health care provider what weight is healthy for you. If you need to lose weight, work with your health care provider to do so safely.  Exercise for at least 30 minutes on 5 or more days each week, or as told by your health care provider.  Avoid  wearing clothes that fit tightly around your waist and chest.  Do not use any products that contain nicotine or tobacco, such as cigarettes and e-cigarettes. If you need help quitting, ask your health care provider.  Sleep with the head of your bed raised. Use a wedge under the mattress or blocks under the bed frame to raise the head of the bed. What foods are not recommended? The items listed may not be a complete list. Talk with your dietitian about what dietary choices are best for you. Grains Pastries or quick breads with added fat. [place] toast. Vegetables Deep fried vegetables. [place] fries. Any vegetables prepared with added fat. Any vegetables that cause symptoms. For some people this may include tomatoes and tomato products, chili peppers, onions and garlic, and horseradish. Fruits Any fruits prepared with added fat. Any fruits that cause symptoms. For some people this may include citrus fruits, such as oranges, grapefruit, pineapple, and lemons. Meats and other protein foods High-fat meats, such as fatty beef or pork, hot dogs, ribs, ham, sausage, salami and bacon. Fried meat or protein, including fried fish and fried chicken. Nuts and nut butters. Dairy Whole milk and chocolate milk. Sour cream. Cream. Ice cream. Cream cheese. Milk shakes. Beverages Coffee and tea, with or without caffeine. Carbonated beverages. Sodas. Energy drinks. Fruit juice made with acidic fruits (such as orange or grapefruit). Tomato juice. Alcoholic drinks. Fats and oils Butter. Margarine. Shortening. Ghee. Sweets and desserts Chocolate and cocoa. Donuts. Seasoning and other foods Pepper. Peppermint and spearmint. Any condiments, herbs, or seasonings that cause symptoms. For some people,  this may include curry, hot sauce, or vinegar-based salad dressings. Summary  When you have gastroesophageal reflux disease (GERD), food and lifestyle choices are very important to help ease the discomfort of  GERD.  Eat frequent, small meals instead of three large meals each day. Eat your meals slowly, in a relaxed setting. Avoid bending over or lying down until 2-3 hours after eating.  Limit high-fat foods such as fatty meat or fried foods. This information is not intended to replace advice given to you by your health care provider. Make sure you discuss any questions you have with your health care provider. Document Revised: 12/04/2018 Document Reviewed: 08/14/2016 Elsevier Patient Education  [employer].

## 2020-03-01 NOTE — Progress Notes (Signed)
Patient request refills on anxiety medication and vitamin D.

## 2020-03-01 NOTE — Progress Notes (Signed)
Established Patient Office Visit  Subjective:  Patient ID: Alicia [name], female    DOB: 10/25/1980  Age: 39 y.o. MRN: [id number]  CC:  Chief Complaint  Patient presents with  . Follow-up    HPI Alicia [name] was seen on the mobile unit on January 28, 2020, was found to have H. pylori infection, and vitamin D deficiency.  Reports that she did complete the antibiotic regimen for H. pylori, states that her epigastric pain has resolved, continues to take the Protonix on a daily basis.  Reports that she took the 50,000 units of vitamin D on a daily basis instead of weekly.  1. Generalized anxiety disorder Reports that she has noticed an improvement since restarting her medication.  Has been using hydroxyzine with relief of heightened anxiety.  Reports that she will use 20 mg of hydroxyzine as needed with relief of insomnia.    Past Medical History:  Diagnosis Date  . Anxiety   . Bladder infection   . Complication of anesthesia    Epidural did not work w/2nd preg. labor  . Depression    No med. currently, Stopped with + UPT  . Depression 06/27/2012  . Gonorrhea   . Hemorrhoids 06/27/2012  . Osteoarthritis 06/27/2012  . Panic disorder 06/27/2012    Past Surgical History:  Procedure Laterality Date  . BREAST MASS EXCISION     Benign  . CESAREAN SECTION     X 1 1st preg., then VBAC  . CESAREAN SECTION N/A 01/27/2013   Procedure: CESAREAN SECTION;  Surgeon: Shelly Bombard, MD;  Location: [facility];  Service: Obstetrics;  Laterality: N/A;  . MOUTH SURGERY      Family History  Problem Relation Age of Onset  . Cancer Mother   . Other Neg Hx   . Alcohol abuse Neg Hx   . Arthritis Neg Hx   . Asthma Neg Hx   . Birth defects Neg Hx   . COPD Neg Hx   . Depression Neg Hx   . Drug abuse Neg Hx   . Diabetes Neg Hx   . Early death Neg Hx   . Hearing loss Neg Hx   . Heart disease Neg Hx   . Hyperlipidemia Neg Hx   . Hypertension Neg Hx   . Kidney disease Neg Hx   . Learning  disabilities Neg Hx   . Mental illness Neg Hx   . Mental retardation Neg Hx   . Miscarriages / Stillbirths Neg Hx   . Stroke Neg Hx   . Vision loss Neg Hx     Social History   Socioeconomic History  . Marital status: Widowed    Spouse name: Not on file  . Number of children: Not on file  . Years of education: Not on file  . Highest education level: Not on file  Occupational History  . Not on file  Tobacco Use  . Smoking status: Current Every Day Smoker    Packs/day: 1.00    Types: Cigarettes    Last attempt to quit: 06/22/2005    Years since quitting: 14.7  . Smokeless tobacco: Never Used  Vaping Use  . Vaping Use: Never used  Substance and Sexual Activity  . Alcohol use: No    Comment: Every other month  . Drug use: No  . Sexual activity: Yes    Birth control/protection: None    Comment: Last intercourse at least 1 week ago  Other Topics Concern  . Not on file  Social History Narrative  . Not on file   Social Determinants of Health   Financial Resource Strain:   . Difficulty of Paying Living Expenses:   Food Insecurity:   . Worried About Charity fundraiser in the Last Year:   . Arboriculturist in the Last Year:   Transportation Needs:   . Film/video editor (Medical):   Marland Kitchen Lack of Transportation (Non-Medical):   Physical Activity:   . Days of Exercise per Week:   . Minutes of Exercise per Session:   Stress:   . Feeling of Stress :   Social Connections:   . Frequency of Communication with Friends and Family:   . Frequency of Social Gatherings with Friends and Family:   . Attends Religious Services:   . Active Member of Clubs or Organizations:   . Attends Archivist Meetings:   Marland Kitchen Marital Status:   Intimate Partner Violence:   . Fear of Current or Ex-Partner:   . Emotionally Abused:   Marland Kitchen Physically Abused:   . Sexually Abused:     Outpatient Medications Prior to Visit  Medication Sig Dispense Refill  . albuterol (PROVENTIL HFA;VENTOLIN  HFA) 108 (90 Base) MCG/ACT inhaler Inhale 1-2 puffs into the lungs every 6 (six) hours as needed for wheezing or shortness of breath. 1 Inhaler 0  . ARIPiprazole (ABILIFY) 2 MG tablet Take 1 tablet (2 mg total) by mouth daily. 30 tablet 2  . clonazePAM (KLONOPIN) 1 MG tablet Take 1 mg by mouth 2 (two) times daily as needed for anxiety.     . famotidine (PEPCID) 20 MG tablet Take 1 tablet (20 mg total) by mouth 2 (two) times daily. 30 tablet 0  . pantoprazole (PROTONIX) 40 MG tablet Take 1 tablet (40 mg total) by mouth daily. 30 tablet 3  . hydrOXYzine (ATARAX/VISTARIL) 10 MG tablet Take 1 tablet (10 mg total) by mouth 3 (three) times daily as needed. 30 tablet 0  . PARoxetine (PAXIL) 10 MG tablet Take 1 tablet (10 mg total) by mouth daily. 30 tablet 0  . Vitamin D, Ergocalciferol, (DRISDOL) 1.25 MG (50000 UNIT) CAPS capsule Take 1 capsule (50,000 Units total) by mouth every 7 (seven) days. 12 capsule 0  . LO LOESTRIN FE 1 MG-10 MCG / 10 MCG tablet Take 1 tablet by mouth daily. (Patient not taking: Reported on 01/26/2020) 3 Package 4   No facility-administered medications prior to visit.    Allergies  Allergen Reactions  . Penicillins     ROS Review of Systems  Constitutional: Negative.   HENT: Negative.   Eyes: Negative.   Respiratory: Negative.   Cardiovascular: Negative.   Gastrointestinal: Negative.   Endocrine: Negative.   Genitourinary: Negative.   Musculoskeletal: Negative.   Skin: Negative.   Allergic/Immunologic: Negative.   Neurological: Negative.   Hematological: Negative.   Psychiatric/Behavioral: Negative.       Objective:    Physical Exam Vitals and nursing note reviewed.  Constitutional:      Appearance: Normal appearance.  HENT:     Head: Normocephalic and atraumatic.     Right Ear: External ear normal.     Left Ear: External ear normal.     Nose: Nose normal.     Mouth/Throat:     Mouth: Mucous membranes are moist.     Pharynx: Oropharynx is clear.   Eyes:     Extraocular Movements: Extraocular movements intact.     Conjunctiva/sclera: Conjunctivae normal.     Pupils:  Pupils are equal, round, and reactive to light.  Cardiovascular:     Rate and Rhythm: Normal rate and regular rhythm.  Pulmonary:     Effort: Pulmonary effort is normal.     Breath sounds: Normal breath sounds.  Abdominal:     General: Abdomen is flat. Bowel sounds are normal.     Palpations: Abdomen is soft.  Musculoskeletal:        General: Normal range of motion.     Cervical back: Normal range of motion and neck supple.  Skin:    General: Skin is warm and dry.  Neurological:     General: No focal deficit present.     Mental Status: She is oriented to person, place, and time.  Psychiatric:        Mood and Affect: Mood normal.        Behavior: Behavior normal.        Thought Content: Thought content normal.        Judgment: Judgment normal.     BP 102/63 (BP Location: Left Arm, Patient Position: Sitting, Cuff Size: Large)   Pulse 76   Temp 98.5 F (36.9 C) (Oral)   Resp 18   Ht 5\' 2"  (1.575 m)   Wt 214 lb (97.1 kg)   SpO2 98%   BMI 39.14 kg/m  Wt Readings from Last 3 Encounters:  03/01/20 214 lb (97.1 kg)  01/26/20 213 lb (96.6 kg)  09/25/19 216 lb (98 kg)     Health Maintenance Due  Topic Date Due  . COVID-19 Vaccine (1) Never done  . PAP SMEAR-Modifier  08/24/2019    There are no preventive care reminders to display for this patient.  Lab Results  Component Value Date   TSH 1.350 01/26/2020   Lab Results  Component Value Date   WBC 6.2 01/26/2020   HGB 12.9 01/26/2020   HCT 40.6 01/26/2020   MCV 82 01/26/2020   PLT 286 01/26/2020   Lab Results  Component Value Date   NA 139 01/26/2020   K 3.8 01/26/2020   CO2 24 09/16/2019   GLUCOSE 101 (H) 01/26/2020   BUN 7 01/26/2020   CREATININE 0.67 01/26/2020   BILITOT 0.8 01/26/2020   ALKPHOS 82 01/26/2020   AST 19 01/26/2020   ALT 21 09/16/2019   PROT 7.5 01/26/2020    ALBUMIN 4.1 01/26/2020   CALCIUM 9.5 01/26/2020   ANIONGAP 11 09/16/2019   Lab Results  Component Value Date   CHOL 188 01/26/2020   Lab Results  Component Value Date   HDL 47 01/26/2020   Lab Results  Component Value Date   LDLCALC 130 (H) 01/26/2020   Lab Results  Component Value Date   TRIG 58 01/26/2020   Lab Results  Component Value Date   CHOLHDL 4.0 01/26/2020   No results found for: HGBA1C    Assessment & Plan:   Problem List Items Addressed This Visit      Digestive   GERD without esophagitis    Other Visit Diagnoses    Generalized anxiety disorder       Relevant Medications   hydrOXYzine (ATARAX/VISTARIL) 10 MG tablet   PARoxetine (PAXIL) 10 MG tablet   Vitamin D deficiency       Relevant Medications   Vitamin D, Ergocalciferol, (DRISDOL) 1.25 MG (50000 UNIT) CAPS capsule   Bipolar depression ([place])         1. Generalized anxiety disorder Patient is much improved restarting Paxil at 10 mg, will  continue this dosing, continue hydroxyzine as needed. - hydrOXYzine (ATARAX/VISTARIL) 10 MG tablet; Take 1 tablet (10 mg total) by mouth 3 (three) times daily as needed.  Dispense: 60 tablet; Refill: 2 - PARoxetine (PAXIL) 10 MG tablet; Take 1 tablet (10 mg total) by mouth daily.  Dispense: 30 tablet; Refill: 2  2. GERD without esophagitis Encourage patient to continue following lifestyle modifications, continue Protonix on a daily basis  3. Vitamin D deficiency Encouraged patient to take 2000 units of vitamin D on a daily basis if she is unable to ge the 50,000 unit once weekly prescription refilled - Vitamin D, Ergocalciferol, (DRISDOL) 1.25 MG (50000 UNIT) CAPS capsule; Take 1 capsule (50,000 Units total) by mouth every 7 (seven) days.  Dispense: 12 capsule; Refill: 0  4. Bipolar depression ([facility]) Continue Abilify 2 mg  Patient has upcoming appointment to establish primary care with Dr. Juleen China.   I have reviewed the patient's medical history (PMH, PSH,  Social History, Family History, Medications, and allergies) , and have been updated if relevant. I spent 20 minutes reviewing chart and  face to face time with patient.     Meds ordered this encounter  Medications  . hydrOXYzine (ATARAX/VISTARIL) 10 MG tablet    Sig: Take 1 tablet (10 mg total) by mouth 3 (three) times daily as needed.    Dispense:  60 tablet    Refill:  2    Order Specific Question:   Supervising Provider    Answer:   Joya Gaskins, PATRICK E [[id number]]  . PARoxetine (PAXIL) 10 MG tablet    Sig: Take 1 tablet (10 mg total) by mouth daily.    Dispense:  30 tablet    Refill:  2    Order Specific Question:   Supervising Provider    Answer:   Joya Gaskins, PATRICK E [[id number]]  . Vitamin D, Ergocalciferol, (DRISDOL) 1.25 MG (50000 UNIT) CAPS capsule    Sig: Take 1 capsule (50,000 Units total) by mouth every 7 (seven) days.    Dispense:  12 capsule    Refill:  0    Patient took med incorrectly, please refill    Order Specific Question:   Supervising Provider    Answer:   Elsie Stain [[id number]]    Follow-up: Return if symptoms worsen or fail to improve.    Loraine Grip Mayers, PA-C

## 2020-03-11 ENCOUNTER — Ambulatory Visit: Payer: Medicaid Other | Admitting: Internal Medicine

## 2020-03-14 ENCOUNTER — Ambulatory Visit (INDEPENDENT_AMBULATORY_CARE_PROVIDER_SITE_OTHER): Payer: Medicaid Other | Admitting: Physician Assistant

## 2020-03-14 ENCOUNTER — Other Ambulatory Visit: Payer: Self-pay

## 2020-03-14 VITALS — BP 98/65 | HR 82 | Temp 98.7°F | Resp 18 | Ht 62.0 in | Wt 217.0 lb

## 2020-03-14 DIAGNOSIS — F319 Bipolar disorder, unspecified: Secondary | ICD-10-CM | POA: Diagnosis not present

## 2020-03-14 DIAGNOSIS — M25512 Pain in left shoulder: Secondary | ICD-10-CM

## 2020-03-14 DIAGNOSIS — M25511 Pain in right shoulder: Secondary | ICD-10-CM

## 2020-03-14 DIAGNOSIS — J45909 Unspecified asthma, uncomplicated: Secondary | ICD-10-CM | POA: Diagnosis not present

## 2020-03-14 DIAGNOSIS — K219 Gastro-esophageal reflux disease without esophagitis: Secondary | ICD-10-CM

## 2020-03-14 DIAGNOSIS — E559 Vitamin D deficiency, unspecified: Secondary | ICD-10-CM

## 2020-03-14 DIAGNOSIS — F411 Generalized anxiety disorder: Secondary | ICD-10-CM

## 2020-03-14 DIAGNOSIS — Z6839 Body mass index (BMI) 39.0-39.9, adult: Secondary | ICD-10-CM

## 2020-03-14 DIAGNOSIS — F17200 Nicotine dependence, unspecified, uncomplicated: Secondary | ICD-10-CM

## 2020-03-14 DIAGNOSIS — E6609 Other obesity due to excess calories: Secondary | ICD-10-CM

## 2020-03-14 DIAGNOSIS — G479 Sleep disorder, unspecified: Secondary | ICD-10-CM

## 2020-03-14 MED ORDER — PANTOPRAZOLE SODIUM 40 MG PO TBEC: 40.0000 mg | DELAYED_RELEASE_TABLET | Freq: Every day | ORAL | 3 refills | Status: DC

## 2020-03-14 MED ORDER — PAROXETINE HCL 10 MG PO TABS: 10.0000 mg | ORAL_TABLET | Freq: Every day | ORAL | 2 refills | Status: DC

## 2020-03-14 MED ORDER — ALBUTEROL SULFATE HFA 108 (90 BASE) MCG/ACT IN AERS: 1.0000 | INHALATION_SPRAY | Freq: Four times a day (QID) | RESPIRATORY_TRACT | 0 refills | Status: DC | PRN

## 2020-03-14 MED ORDER — HYDROXYZINE HCL 10 MG PO TABS: 10.0000 mg | ORAL_TABLET | Freq: Three times a day (TID) | ORAL | 2 refills | Status: DC | PRN

## 2020-03-14 MED ORDER — ARIPIPRAZOLE 2 MG PO TABS: 2.0000 mg | ORAL_TABLET | Freq: Every day | ORAL | 2 refills | Status: DC

## 2020-03-14 NOTE — Progress Notes (Signed)
Established Patient Office Visit  Subjective:  Patient ID: Alicia [name], female    DOB: [birth year]  Age: 39 y.o. MRN: [id number]  CC:  Chief Complaint  Patient presents with  . Establish Care    HPI [facility]   1. Generalized anxiety disorder   Reports that she feels her anxiety is well controlled, states that she continues to take Paxil 10 mg, is using 10 mg of hydroxyzine in the morning with relief.   2. GERD without esophagitis Reports that she continues to take Protonix on a daily basis, reports she will have occasional breakthrough heartburn, attributes this to diet choices. Does not take anything for breakthrough heartburn. Continues to avoid NSAIDs.  3. Vitamin D deficiency  Reports that she did not restart the vitamin D, states that the pharmacy did tell her her prescription was available, however she did not have the funds to get it filled.   4. Bipolar depression ([place]) Reports moods stable, is taking Abilify as directed.   Reports that she has been having bilateral shoulder pain, states this has been present for several years, denies injury or trauma, states that she has tried Tylenol and Salonpas with some relief. 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 Reports that she is a side sleeper, is unable to sleep on her back due to previous diagnosis of sleep apnea. Reports she does not have a CPAP machine. Reports that study was several years ago.  Reports last Pap approximately 1 year ago, states that she is followed by OB/GYN for this due to history of fibroids. Reports that she was previously encouraged to take oral birth control but is unable to take this due to tobacco use. Reports that she is smoking approximately 5 cigarettes a day, states that she is working on reducing the amount she is smoking.  Reports that she feels tired today, attributes this to working extra hours.  Reports  history of asthma, states that she uses the albuterol on occasion with relief. Denies nighttime awakenings, will use albuterol approximately 2-3 times a month.   Past Medical History:  Diagnosis Date  . Anxiety   . Arthritis    Phreesia 03/08/2020  . Bladder infection   . Complication of anesthesia    Epidural did not work w/2nd preg. labor  . Depression    No med. currently, Stopped with + UPT  . Depression 06/27/2012  . Gonorrhea   . Hemorrhoids 06/27/2012  . Osteoarthritis 06/27/2012  . Osteoporosis    Phreesia 03/08/2020  . Panic disorder 06/27/2012  . Sleep apnea    Phreesia 03/08/2020    Past Surgical History:  Procedure Laterality Date  . BREAST MASS EXCISION     Benign  . BREAST SURGERY N/A    Phreesia 03/08/2020  . CESAREAN SECTION     X 1 1st preg., then VBAC  . CESAREAN SECTION N/A 01/27/2013   Procedure: CESAREAN SECTION;  Surgeon: Shelly Bombard, MD;  Location: [facility];  Service: Obstetrics;  Laterality: N/A;  . MOUTH SURGERY      Family History  Problem Relation Age of Onset  . Cancer Mother   . Other Neg Hx   . Alcohol abuse Neg Hx   . Arthritis Neg Hx   . Asthma Neg Hx   . Birth defects Neg Hx   . COPD Neg Hx   . Depression Neg Hx   . Drug abuse Neg Hx   .  Diabetes Neg Hx   . Early death Neg Hx   . Hearing loss Neg Hx   . Heart disease Neg Hx   . Hyperlipidemia Neg Hx   . Hypertension Neg Hx   . Kidney disease Neg Hx   . Learning disabilities Neg Hx   . Mental illness Neg Hx   . Mental retardation Neg Hx   . Miscarriages / Stillbirths Neg Hx   . Stroke Neg Hx   . Vision loss Neg Hx     Social History   Socioeconomic History  . Marital status: Widowed    Spouse name: Not on file  . Number of children: Not on file  . Years of education: Not on file  . Highest education level: Not on file  Occupational History  . Not on file  Tobacco Use  . Smoking status: Current Every Day Smoker    Packs/day: 0.25    Types: Cigarettes    Last  attempt to quit: 06/22/2005    Years since quitting: 14.7  . Smokeless tobacco: Never Used  Vaping Use  . Vaping Use: Never used  Substance and Sexual Activity  . Alcohol use: No    Comment: Every other month  . Drug use: No  . Sexual activity: Yes    Birth control/protection: None    Comment: Last intercourse at least 1 week ago  Other Topics Concern  . Not on file  Social History Narrative  . Not on file   Social Determinants of Health   Financial Resource Strain:   . Difficulty of Paying Living Expenses:   Food Insecurity:   . Worried About Charity fundraiser in the Last Year:   . Arboriculturist in the Last Year:   Transportation Needs:   . Film/video editor (Medical):   Marland Kitchen Lack of Transportation (Non-Medical):   Physical Activity:   . Days of Exercise per Week:   . Minutes of Exercise per Session:   Stress:   . Feeling of Stress :   Social Connections:   . Frequency of Communication with Friends and Family:   . Frequency of Social Gatherings with Friends and Family:   . Attends Religious Services:   . Active Member of Clubs or Organizations:   . Attends Archivist Meetings:   Marland Kitchen Marital Status:   Intimate Partner Violence:   . Fear of Current or Ex-Partner:   . Emotionally Abused:   Marland Kitchen Physically Abused:   . Sexually Abused:     Outpatient Medications Prior to Visit  Medication Sig Dispense Refill  . clonazePAM (KLONOPIN) 1 MG tablet Take 1 mg by mouth 2 (two) times daily as needed for anxiety.     . LO LOESTRIN FE 1 MG-10 MCG / 10 MCG tablet Take 1 tablet by mouth daily. (Patient not taking: Reported on 01/26/2020) 3 Package 4  . Vitamin D, Ergocalciferol, (DRISDOL) 1.25 MG (50000 UNIT) CAPS capsule Take 1 capsule (50,000 Units total) by mouth every 7 (seven) days. 12 capsule 0  . albuterol (PROVENTIL HFA;VENTOLIN HFA) 108 (90 Base) MCG/ACT inhaler Inhale 1-2 puffs into the lungs every 6 (six) hours as needed for wheezing or shortness of breath. 1  Inhaler 0  . ARIPiprazole (ABILIFY) 2 MG tablet Take 1 tablet (2 mg total) by mouth daily. 30 tablet 2  . famotidine (PEPCID) 20 MG tablet Take 1 tablet (20 mg total) by mouth 2 (two) times daily. 30 tablet 0  . hydrOXYzine (ATARAX/VISTARIL) 10  MG tablet Take 1 tablet (10 mg total) by mouth 3 (three) times daily as needed. 60 tablet 2  . pantoprazole (PROTONIX) 40 MG tablet Take 1 tablet (40 mg total) by mouth daily. 30 tablet 3  . PARoxetine (PAXIL) 10 MG tablet Take 1 tablet (10 mg total) by mouth daily. 30 tablet 2   No facility-administered medications prior to visit.    Allergies  Allergen Reactions  . Penicillins     ROS Review of Systems  Constitutional: Negative.   HENT: Negative.   Eyes: Negative.   Respiratory: Negative.   Cardiovascular: Negative.   Gastrointestinal: Negative.   Endocrine: Negative.   Genitourinary: Negative.   Musculoskeletal: Positive for arthralgias and back pain.  Skin: Negative.   Allergic/Immunologic: Negative.   Neurological: Negative.   Hematological: Negative.   Psychiatric/Behavioral: Negative.   All other systems reviewed and are negative.     Objective:    Physical Exam Vitals and nursing note reviewed.  Constitutional:      Appearance: Normal appearance. She is obese.  HENT:     Head: Normocephalic and atraumatic.     Right Ear: External ear normal.     Left Ear: External ear normal.     Nose: Nose normal.     Mouth/Throat:     Mouth: Mucous membranes are moist.     Pharynx: Oropharynx is clear.  Eyes:     Extraocular Movements: Extraocular movements intact.     Conjunctiva/sclera: Conjunctivae normal.     Pupils: Pupils are equal, round, and reactive to light.  Cardiovascular:     Rate and Rhythm: Normal rate and regular rhythm.     Pulses: Normal pulses.     Heart sounds: Normal heart sounds.  Pulmonary:     Effort: Pulmonary effort is normal.     Breath sounds: Normal breath sounds.  Abdominal:     General:  Abdomen is flat. Bowel sounds are normal.     Palpations: Abdomen is soft.     Tenderness: There is no abdominal tenderness.  Musculoskeletal:     Right shoulder: Tenderness present. No swelling. Decreased range of motion.     Left shoulder: Tenderness present. No swelling. Decreased range of motion.     Cervical back: Normal range of motion and neck supple.  Skin:    General: Skin is warm and dry.  Neurological:     General: No focal deficit present.     Mental Status: She is alert and oriented to person, place, and time.  Psychiatric:        Mood and Affect: Mood normal.        Behavior: Behavior normal.        Thought Content: Thought content normal.        Judgment: Judgment normal.     BP 98/65 (BP Location: Left Arm, Patient Position: Sitting, Cuff Size: Large)   Pulse 82   Temp 98.7 F (37.1 C) (Oral)   Resp 18   Ht 5\' 2"  (1.575 m)   Wt 217 lb (98.4 kg)   SpO2 98%   BMI 39.69 kg/m  Wt Readings from Last 3 Encounters:  03/14/20 217 lb (98.4 kg)  03/01/20 214 lb (97.1 kg)  01/26/20 213 lb (96.6 kg)     Health Maintenance Due  Topic Date Due  . COVID-19 Vaccine (1) Never done  . PAP SMEAR-Modifier  08/24/2019    There are no preventive care reminders to display for this patient.  Lab Results  Component  Value Date   TSH 1.350 01/26/2020   Lab Results  Component Value Date   WBC 6.2 01/26/2020   HGB 12.9 01/26/2020   HCT 40.6 01/26/2020   MCV 82 01/26/2020   PLT 286 01/26/2020   Lab Results  Component Value Date   NA 139 01/26/2020   K 3.8 01/26/2020   CO2 24 09/16/2019   GLUCOSE 101 (H) 01/26/2020   BUN 7 01/26/2020   CREATININE 0.67 01/26/2020   BILITOT 0.8 01/26/2020   ALKPHOS 82 01/26/2020   AST 19 01/26/2020   ALT 21 09/16/2019   PROT 7.5 01/26/2020   ALBUMIN 4.1 01/26/2020   CALCIUM 9.5 01/26/2020   ANIONGAP 11 09/16/2019   Lab Results  Component Value Date   CHOL 188 01/26/2020   Lab Results  Component Value Date   HDL 47  01/26/2020   Lab Results  Component Value Date   LDLCALC 130 (H) 01/26/2020   Lab Results  Component Value Date   TRIG 58 01/26/2020   Lab Results  Component Value Date   CHOLHDL 4.0 01/26/2020   No results found for: HGBA1C    Assessment & Plan:   Problem List Items Addressed This Visit      Digestive   GERD without esophagitis   Relevant Medications   pantoprazole (PROTONIX) 40 MG tablet    Other Visit Diagnoses    Bipolar depression ([place])    -  Primary   Relevant Medications   ARIPiprazole (ABILIFY) 2 MG tablet   Generalized anxiety disorder       Relevant Medications   PARoxetine (PAXIL) 10 MG tablet   hydrOXYzine (ATARAX/VISTARIL) 10 MG tablet   Mild asthma, unspecified whether complicated, unspecified whether persistent       Relevant Medications   albuterol (VENTOLIN HFA) 108 (90 Base) MCG/ACT inhaler   Pain of both shoulder joints       Vitamin D deficiency       Sleep disorder       Relevant Orders   Ambulatory referral to Sleep Studies   Tobacco use disorder       Class 2 obesity due to excess calories without serious comorbidity with body mass index (BMI) of 39.0 to 39.9 in adult        1. Bipolar depression ([place]) Continue current regimen - ARIPiprazole (ABILIFY) 2 MG tablet; Take 1 tablet (2 mg total) by mouth daily.  Dispense: 30 tablet; Refill: 2  2. Generalized anxiety disorder Continue current regimen - PARoxetine (PAXIL) 10 MG tablet; Take 1 tablet (10 mg total) by mouth daily.  Dispense: 30 tablet; Refill: 2 - hydrOXYzine (ATARAX/VISTARIL) 10 MG tablet; Take 1 tablet (10 mg total) by mouth 3 (three) times daily as needed.  Dispense: 60 tablet; Refill: 2  3. GERD without esophagitis Patient education given on GERD diet, encouraged to use Tums for breakthrough heartburn - pantoprazole (PROTONIX) 40 MG tablet; Take 1 tablet (40 mg total) by mouth daily.  Dispense: 30 tablet; Refill: 3  4. Mild asthma, unspecified whether complicated,  unspecified whether persistent Refill albuterol per patient request  5. Pain of both shoulder joints Gave patient education on rest, ice, gentle stretching.  6. Vitamin D deficiency Resume vitamin D regimen  7. Sleep disorder  - Ambulatory referral to Sleep Studies  8. Tobacco use disorder Gave patient education on smoking cessation  9. Class 2 obesity due to excess calories without serious comorbidity with body mass index (BMI) of 39.0 to 39.9 in adult  Patient  to follow-up with OB/GYN for annual Pap   I have reviewed the patient's medical history (PMH, PSH, Social History, Family History, Medications, and allergies) , and have been updated if relevant. I spent 30 minutes reviewing chart and  face to face time with patient.      Meds ordered this encounter  Medications  . PARoxetine (PAXIL) 10 MG tablet    Sig: Take 1 tablet (10 mg total) by mouth daily.    Dispense:  30 tablet    Refill:  2    Order Specific Question:   Supervising Provider    Answer:   Joya Gaskins, PATRICK E [[id number]]  . pantoprazole (PROTONIX) 40 MG tablet    Sig: Take 1 tablet (40 mg total) by mouth daily.    Dispense:  30 tablet    Refill:  3    Order Specific Question:   Supervising Provider    Answer:   Joya Gaskins, PATRICK E [[id number]]  . hydrOXYzine (ATARAX/VISTARIL) 10 MG tablet    Sig: Take 1 tablet (10 mg total) by mouth 3 (three) times daily as needed.    Dispense:  60 tablet    Refill:  2    Order Specific Question:   Supervising Provider    Answer:   Joya Gaskins, PATRICK E [[id number]]  . ARIPiprazole (ABILIFY) 2 MG tablet    Sig: Take 1 tablet (2 mg total) by mouth daily.    Dispense:  30 tablet    Refill:  2    Order Specific Question:   Supervising Provider    Answer:   Joya Gaskins, PATRICK E [[id number]]  . albuterol (VENTOLIN HFA) 108 (90 Base) MCG/ACT inhaler    Sig: Inhale 1-2 puffs into the lungs every 6 (six) hours as needed for wheezing or shortness of breath.    Dispense:  8 g    Refill:  0    Order  Specific Question:   Supervising Provider    Answer:   Noralyn Pick    Follow-up: Return in about 3 months (around 06/14/2020) for with Dr. Juleen China at [place].    Loraine Grip Mayers, PA-C

## 2020-03-14 NOTE — Progress Notes (Signed)
Patient complains of feeling "tired" today. 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

## 2020-03-14 NOTE — Patient Instructions (Addendum)
Shoulder Pain Many things can cause shoulder pain, including:  An injury to the shoulder.  Overuse of the shoulder.  Arthritis. The source of the pain can be:  Inflammation.  An injury to the shoulder joint.  An injury to a tendon, ligament, or bone. Follow these instructions at home: Pay attention to changes in your symptoms. Let your health care provider know about them. Follow these instructions to relieve your pain. If you have a sling:  Wear the sling as told by your health care provider. Remove it only as told by your health care provider.  Loosen the sling if your fingers tingle, become numb, or turn cold and blue.  Keep the sling clean.  If the sling is not waterproof: ? Do not let it get wet. Remove it to shower or bathe.  Move your arm as little as possible, but keep your hand moving to prevent swelling. Managing pain, stiffness, and swelling   If directed, put ice on the painful area: ? Put ice in a plastic bag. ? Place a towel between your skin and the bag. ? Leave the ice on for 20 minutes, 2-3 times per day. Stop applying ice if it does not help with the pain.  Squeeze a soft ball or a foam pad as much as possible. This helps to keep the shoulder from swelling. It also helps to strengthen the arm. General instructions  Take over-the-counter and prescription medicines only as told by your health care provider.  Keep all follow-up visits as told by your health care provider. This is important. Contact a health care provider if:  Your pain gets worse.  Your pain is not relieved with medicines.  New pain develops in your arm, hand, or fingers. Get help right away if:  Your arm, hand, or fingers: ? Tingle. ? Become numb. ? Become swollen. ? Become painful. ? Turn white or blue. Summary  Shoulder pain can be caused by an injury, overuse, or arthritis.  Pay attention to changes in your symptoms. Let your health care provider know about  them.  This condition may be treated with a sling, ice, and pain medicines.  Contact your health care provider if the pain gets worse or new pain develops. Get help right away if your arm, hand, or fingers tingle or become numb, swollen, or painful.  Keep all follow-up visits as told by your health care provider. This is important. This information is not intended to replace advice given to you by your health care provider. Make sure you discuss any questions you have with your health care provider. Document Revised: 02/25/2018 Document Reviewed: 02/25/2018 Elsevier Patient Education  2020 Elsevier Inc.  

## 2020-04-15 ENCOUNTER — Telehealth: Payer: Self-pay

## 2020-04-15 NOTE — Telephone Encounter (Signed)
Patient called saying she was suppose to have a sleep study done but no one has called her. Please f/u

## 2020-04-18 NOTE — Telephone Encounter (Signed)
Patient has been contacted via mychart regarding sleep study referral.

## 2020-04-26 ENCOUNTER — Telehealth (INDEPENDENT_AMBULATORY_CARE_PROVIDER_SITE_OTHER): Payer: Medicaid Other | Admitting: Internal Medicine

## 2020-04-26 ENCOUNTER — Encounter: Payer: Self-pay | Admitting: Internal Medicine

## 2020-04-26 DIAGNOSIS — F411 Generalized anxiety disorder: Secondary | ICD-10-CM | POA: Diagnosis not present

## 2020-04-26 DIAGNOSIS — Z7689 Persons encountering health services in other specified circumstances: Secondary | ICD-10-CM

## 2020-04-26 DIAGNOSIS — F319 Bipolar disorder, unspecified: Secondary | ICD-10-CM | POA: Insufficient documentation

## 2020-04-26 DIAGNOSIS — G479 Sleep disorder, unspecified: Secondary | ICD-10-CM

## 2020-04-26 DIAGNOSIS — E559 Vitamin D deficiency, unspecified: Secondary | ICD-10-CM

## 2020-04-26 DIAGNOSIS — F3170 Bipolar disorder, currently in remission, most recent episode unspecified: Secondary | ICD-10-CM | POA: Diagnosis not present

## 2020-04-26 MED ORDER — PAROXETINE HCL 20 MG PO TABS: 20.0000 mg | ORAL_TABLET | Freq: Every day | ORAL | 1 refills | Status: DC

## 2020-04-26 NOTE — Progress Notes (Signed)
Virtual Visit via Telephone Note  I connected with Alicia [name], on 04/26/2020 at 3:29 PM by telephone due to the COVID-19 pandemic and verified that I am speaking with the correct person using two identifiers.   Consent: I discussed the limitations, risks, security and privacy concerns of performing an evaluation and management service by telephone and the availability of in person appointments. I also discussed with the patient that there may be a patient responsible charge related to this service. The patient expressed understanding and agreed to proceed.   Location of Patient: Home   Location of Provider: Home    Persons participating in Telemedicine visit: Alicia [name] [facility] Dr. Juleen China      History of Present Illness: Patient has a visit to establish care. Has a history of anxiety and bipolar disorder. She is on Abilify for bipolar. Reports that she feels like bipolar disorder is well controlled currently. Established with Ob-GYN.   Reports history of OSA. Was supposed to be on CPAP machine but that doctor closed down and she was not able to get that equipment. Has never had a machine at home. Wakes up coughing and gasping for air. Snores at nighttime. Endorses excessive daytime sleepiness.   Patient has a history of GAD. She was started on Paxil 10 mg on 7/19. Doesn't feel like it is working. Wants to know about restarting Klonopin.   GAD 7 : Generalized Anxiety Score 04/26/2020  Nervous, Anxious, on Edge 3  Control/stop worrying 3  Worry too much - different things 3  Trouble relaxing 3  Restless 3  Easily annoyed or irritable 3  Afraid - awful might happen 3  Total GAD 7 Score 21      Past Medical History:  Diagnosis Date  . Anxiety   . Arthritis    Phreesia 03/08/2020  . Bladder infection   . Complication of anesthesia    Epidural did not work w/2nd preg. labor  . Depression    No med. currently, Stopped with + UPT  . Depression 06/27/2012  .  Gonorrhea   . Hemorrhoids 06/27/2012  . Osteoarthritis 06/27/2012  . Osteoporosis    Phreesia 03/08/2020  . Panic disorder 06/27/2012  . Sleep apnea    Phreesia 03/08/2020   Allergies  Allergen Reactions  . Penicillins     Current Outpatient Medications on File Prior to Visit  Medication Sig Dispense Refill  . albuterol (VENTOLIN HFA) 108 (90 Base) MCG/ACT inhaler Inhale 1-2 puffs into the lungs every 6 (six) hours as needed for wheezing or shortness of breath. 8 g 0  . ARIPiprazole (ABILIFY) 2 MG tablet Take 1 tablet (2 mg total) by mouth daily. 30 tablet 2  . clonazePAM (KLONOPIN) 1 MG tablet Take 1 mg by mouth 2 (two) times daily as needed for anxiety.     . hydrOXYzine (ATARAX/VISTARIL) 10 MG tablet Take 1 tablet (10 mg total) by mouth 3 (three) times daily as needed. 60 tablet 2  . pantoprazole (PROTONIX) 40 MG tablet Take 1 tablet (40 mg total) by mouth daily. 30 tablet 3  . PARoxetine (PAXIL) 10 MG tablet Take 1 tablet (10 mg total) by mouth daily. 30 tablet 2  . Vitamin D, Ergocalciferol, (DRISDOL) 1.25 MG (50000 UNIT) CAPS capsule Take 1 capsule (50,000 Units total) by mouth every 7 (seven) days. 12 capsule 0   No current facility-administered medications on file prior to visit.    Observations/Objective: NAD. Speaking clearly.  Work of breathing normal.  Alert and oriented.  Mood appropriate.   Assessment and Plan: 1. Encounter to establish care Reviewed patient's PMH, social history, surgical history, and medications.  Plans to get PAP with OBGYN she is already established with.  Discussed importance of COVID-19 vaccination.   2. Generalized anxiety disorder GAD-7 score is significantly elevated and patient reports uncontrolled anxiety. Increase Paxil dose to 20 mg daily. Discussed risks of benzodiazepines and that does not effectively manage GAD long term. Continue Hydroxyzine prn. Needs f/u in 6-8 weeks.  - PARoxetine (PAXIL) 20 MG tablet; Take 1 tablet (20 mg  total) by mouth daily.  Dispense: 30 tablet; Refill: 1  3. Sleep disorder Has history of OSA per patient never treated with CPAP. Symptoms are concerning for untreated OSA. Will need a repeat sleep study.  - [facility] Sleep Study; Future  4. Bipolar disorder in full remission, most recent episode unspecified type ([place]) Stable on Abilify.   5. Vitamin D deficiency Vit D ver low with result of 12.5. Continue supplement. Will need to repeat for monitoring.    Follow Up Instructions: Follow up on anxiety and Vit D deficiency    I discussed the assessment and treatment plan with the patient. The patient was provided an opportunity to ask questions and all were answered. The patient agreed with the plan and demonstrated an understanding of the instructions.   The patient was advised to call back or seek an in-person evaluation if the symptoms worsen or if the condition fails to improve as anticipated.     I provided 22 minutes total of non-face-to-face time during this encounter including median intraservice time, reviewing previous notes, investigations, ordering medications, medical decision making, coordinating care and patient verbalized understanding at the end of the visit.    Phill Myron, D.O. Primary Care at [facility]  04/26/2020, 3:29 PM

## 2020-05-03 ENCOUNTER — Ambulatory Visit: Payer: Medicaid Other | Admitting: Physician Assistant

## 2020-05-03 ENCOUNTER — Other Ambulatory Visit: Payer: Self-pay

## 2020-05-03 VITALS — BP 107/63 | HR 86 | Temp 98.2°F | Ht 62.0 in

## 2020-05-03 DIAGNOSIS — M26609 Unspecified temporomandibular joint disorder, unspecified side: Secondary | ICD-10-CM

## 2020-05-03 DIAGNOSIS — M26602 Left temporomandibular joint disorder, unspecified: Secondary | ICD-10-CM

## 2020-05-03 MED ORDER — CYCLOBENZAPRINE HCL 5 MG PO TABS: 5.0000 mg | ORAL_TABLET | Freq: Two times a day (BID) | ORAL | 0 refills | Status: DC | PRN

## 2020-05-03 NOTE — Progress Notes (Signed)
Established Patient Office Visit  Subjective:  Patient ID: Alicia [name], female    DOB: 21-Sep-1980  Age: 39 y.o. MRN: [id number]  CC:  Chief Complaint  Patient presents with  . Ear Pain    left    HPI [name] reports that she started having pain intermittently in her TMJ approximately 1 month ago.  Denies clicking or popping sounds.  Denied any URI symptoms, denied trauma.  Does endorse that she grinds her teeth while sleeping.  Has been using Excedrin over-the-counter with some relief.  Past Medical History:  Diagnosis Date  . Anxiety   . Arthritis    Phreesia 03/08/2020  . Bladder infection   . Complication of anesthesia    Epidural did not work w/2nd preg. labor  . Depression    No med. currently, Stopped with + UPT  . Depression 06/27/2012  . Gonorrhea   . Hemorrhoids 06/27/2012  . Osteoarthritis 06/27/2012  . Osteoporosis    Phreesia 03/08/2020  . Panic disorder 06/27/2012  . Sleep apnea    Phreesia 03/08/2020    Past Surgical History:  Procedure Laterality Date  . BREAST MASS EXCISION     Benign  . BREAST SURGERY N/A    Phreesia 03/08/2020  . CESAREAN SECTION     X 1 1st preg., then VBAC  . CESAREAN SECTION N/A 01/27/2013   Procedure: CESAREAN SECTION;  Surgeon: Shelly Bombard, MD;  Location: [facility];  Service: Obstetrics;  Laterality: N/A;  . MOUTH SURGERY      Family History  Problem Relation Age of Onset  . Cancer Mother   . Other Neg Hx   . Alcohol abuse Neg Hx   . Arthritis Neg Hx   . Asthma Neg Hx   . Birth defects Neg Hx   . COPD Neg Hx   . Depression Neg Hx   . Drug abuse Neg Hx   . Diabetes Neg Hx   . Early death Neg Hx   . Hearing loss Neg Hx   . Heart disease Neg Hx   . Hyperlipidemia Neg Hx   . Hypertension Neg Hx   . Kidney disease Neg Hx   . Learning disabilities Neg Hx   . Mental illness Neg Hx   . Mental retardation Neg Hx   . Miscarriages / Stillbirths Neg Hx   . Stroke Neg Hx   . Vision loss Neg Hx     Social  History   Socioeconomic History  . Marital status: Widowed    Spouse name: Not on file  . Number of children: Not on file  . Years of education: Not on file  . Highest education level: Not on file  Occupational History  . Not on file  Tobacco Use  . Smoking status: Current Every Day Smoker    Packs/day: 0.25    Types: Cigarettes    Last attempt to quit: 06/22/2005    Years since quitting: 14.8  . Smokeless tobacco: Never Used  Vaping Use  . Vaping Use: Never used  Substance and Sexual Activity  . Alcohol use: No    Comment: Every other month  . Drug use: No  . Sexual activity: Yes    Birth control/protection: None    Comment: Last intercourse at least 1 week ago  Other Topics Concern  . Not on file  Social History Narrative  . Not on file   Social Determinants of Health   Financial Resource Strain:   . Difficulty of Paying  Living Expenses: Not on file  Food Insecurity:   . Worried About Charity fundraiser in the Last Year: Not on file  . Ran Out of Food in the Last Year: Not on file  Transportation Needs:   . Lack of Transportation (Medical): Not on file  . Lack of Transportation (Non-Medical): Not on file  Physical Activity:   . Days of Exercise per Week: Not on file  . Minutes of Exercise per Session: Not on file  Stress:   . Feeling of Stress : Not on file  Social Connections:   . Frequency of Communication with Friends and Family: Not on file  . Frequency of Social Gatherings with Friends and Family: Not on file  . Attends Religious Services: Not on file  . Active Member of Clubs or Organizations: Not on file  . Attends Archivist Meetings: Not on file  . Marital Status: Not on file  Intimate Partner Violence:   . Fear of Current or Ex-Partner: Not on file  . Emotionally Abused: Not on file  . Physically Abused: Not on file  . Sexually Abused: Not on file    Outpatient Medications Prior to Visit  Medication Sig Dispense Refill  . albuterol  (VENTOLIN HFA) 108 (90 Base) MCG/ACT inhaler Inhale 1-2 puffs into the lungs every 6 (six) hours as needed for wheezing or shortness of breath. 8 g 0  . ARIPiprazole (ABILIFY) 2 MG tablet Take 1 tablet (2 mg total) by mouth daily. 30 tablet 2  . clonazePAM (KLONOPIN) 1 MG tablet Take 1 mg by mouth 2 (two) times daily as needed for anxiety.     . hydrOXYzine (ATARAX/VISTARIL) 10 MG tablet Take 1 tablet (10 mg total) by mouth 3 (three) times daily as needed. 60 tablet 2  . pantoprazole (PROTONIX) 40 MG tablet Take 1 tablet (40 mg total) by mouth daily. 30 tablet 3  . PARoxetine (PAXIL) 20 MG tablet Take 1 tablet (20 mg total) by mouth daily. 30 tablet 1  . Vitamin D, Ergocalciferol, (DRISDOL) 1.25 MG (50000 UNIT) CAPS capsule Take 1 capsule (50,000 Units total) by mouth every 7 (seven) days. 12 capsule 0   No facility-administered medications prior to visit.    Allergies  Allergen Reactions  . Penicillins     ROS Review of Systems  Constitutional: Negative.  Negative for chills and fever.  HENT: Positive for ear pain. Negative for hearing loss, sinus pressure, sinus pain and sore throat.   Eyes: Negative.   Respiratory: Negative.   Cardiovascular: Negative.   Gastrointestinal: Negative.   Endocrine: Negative.   Genitourinary: Negative.   Musculoskeletal: Negative.   Skin: Negative.   Allergic/Immunologic: Negative.   Neurological: Negative.   Hematological: Negative.   Psychiatric/Behavioral: Negative.       Objective:    Physical Exam Vitals and nursing note reviewed.  Constitutional:      General: She is not in acute distress.    Appearance: Normal appearance. She is not ill-appearing.  HENT:     Head: Normocephalic and atraumatic.     Jaw: There is normal jaw occlusion. Tenderness and pain on movement present. No swelling.      Right Ear: Tympanic membrane, ear canal and external ear normal. There is no impacted cerumen.     Left Ear: Tympanic membrane, ear canal and  external ear normal. There is no impacted cerumen.     Nose: Nose normal.     Mouth/Throat:     Mouth: Mucous membranes  are moist.  Eyes:     Extraocular Movements: Extraocular movements intact.     Conjunctiva/sclera: Conjunctivae normal.     Pupils: Pupils are equal, round, and reactive to light.  Cardiovascular:     Rate and Rhythm: Normal rate and regular rhythm.     Pulses: Normal pulses.     Heart sounds: Normal heart sounds.  Pulmonary:     Effort: Pulmonary effort is normal.     Breath sounds: Normal breath sounds.  Abdominal:     General: Abdomen is flat.     Palpations: Abdomen is soft.  Musculoskeletal:        General: Normal range of motion.     Cervical back: Normal range of motion and neck supple.  Skin:    General: Skin is warm and dry.  Neurological:     General: No focal deficit present.     Mental Status: She is alert and oriented to person, place, and time.  Psychiatric:        Mood and Affect: Mood normal.        Behavior: Behavior normal.        Thought Content: Thought content normal.        Judgment: Judgment normal.     BP 107/63 (BP Location: Left Arm, Patient Position: Sitting, Cuff Size: Large)   Pulse 86   Temp 98.2 F (36.8 C) (Oral)   Ht 5\' 2"  (1.575 m)   SpO2 100%   BMI 39.69 kg/m  Wt Readings from Last 3 Encounters:  03/14/20 217 lb (98.4 kg)  03/01/20 214 lb (97.1 kg)  01/26/20 213 lb (96.6 kg)     Health Maintenance Due  Topic Date Due  . COVID-19 Vaccine (1) Never done  . PAP SMEAR-Modifier  08/24/2019  . INFLUENZA VACCINE  Never done    There are no preventive care reminders to display for this patient.  Lab Results  Component Value Date   TSH 1.350 01/26/2020   Lab Results  Component Value Date   WBC 6.2 01/26/2020   HGB 12.9 01/26/2020   HCT 40.6 01/26/2020   MCV 82 01/26/2020   PLT 286 01/26/2020   Lab Results  Component Value Date   NA 139 01/26/2020   K 3.8 01/26/2020   CO2 24 09/16/2019   GLUCOSE 101  (H) 01/26/2020   BUN 7 01/26/2020   CREATININE 0.67 01/26/2020   BILITOT 0.8 01/26/2020   ALKPHOS 82 01/26/2020   AST 19 01/26/2020   ALT 21 09/16/2019   PROT 7.5 01/26/2020   ALBUMIN 4.1 01/26/2020   CALCIUM 9.5 01/26/2020   ANIONGAP 11 09/16/2019   Lab Results  Component Value Date   CHOL 188 01/26/2020   Lab Results  Component Value Date   HDL 47 01/26/2020   Lab Results  Component Value Date   LDLCALC 130 (H) 01/26/2020   Lab Results  Component Value Date   TRIG 58 01/26/2020   Lab Results  Component Value Date   CHOLHDL 4.0 01/26/2020   No results found for: HGBA1C    Assessment & Plan:   Problem List Items Addressed This Visit    None    Visit Diagnoses    Temporal mandibular joint disorder    -  Primary   Relevant Medications   cyclobenzaprine (FLEXERIL) 5 MG tablet    1. Temporal mandibular joint disorder Patient encouraged to follow-up with dentist, gave patient education on jaw exercises, trial Flexeril, encouraged Tylenol versus NSAIDs due to history  of H. Pylori.  Patient does take Abilify, encouraged patient to keep journal of jaw movements to rule out involuntary movements   - cyclobenzaprine (FLEXERIL) 5 MG tablet; Take 1 tablet (5 mg total) by mouth 2 (two) times daily as needed for muscle spasms.  Dispense: 30 tablet; Refill: 0   I have reviewed the patient's medical history (PMH, PSH, Social History, Family History, Medications, and allergies) , and have been updated if relevant. I spent 30 minutes reviewing chart and  face to face time with patient.     Meds ordered this encounter  Medications  . cyclobenzaprine (FLEXERIL) 5 MG tablet    Sig: Take 1 tablet (5 mg total) by mouth 2 (two) times daily as needed for muscle spasms.    Dispense:  30 tablet    Refill:  0    Order Specific Question:   Supervising Provider    Answer:   Elsie Stain [[id number]]    Follow-up: Return if symptoms worsen or fail to improve.    Loraine Grip Mayers,  PA-C

## 2020-05-03 NOTE — Patient Instructions (Signed)
I encourage you to follow-up with your dentist for further review.  You can use the muscle relaxers twice a day as needed as well as over-the-counter pain relievers.  Please let us know if there is any else we can do for you  Alicia Rad, PA-C Physician Assistant [place] [email or web address]    Jaw Range of Motion Exercises Jaw range of motion exercises are exercises that help your jaw move better. Exercises that help you have good posture (postural exercises) also help relieve jaw discomfort. These are often done along with range of motion exercises. These exercises can help prevent or improve:  Difficulty opening your mouth.  Pain in your jaw while it is open or closed.  Temporomandibular joint (TMJ) pain.  Headache caused by jaw tension. Take other actions to prevent or relieve jaw pain, such as:  Avoiding things that cause or increase jaw pain. This may include: ? Chewing gum or eating hard foods. ? Clenching your jaw or teeth, grinding your teeth, or keeping tension in your jaw muscles. ? Opening your mouth wide, such as for a big yawn. ? Leaning on your jaw, such as resting your jaw in your hand while leaning on a desk.  Putting ice on your jaw. ? Put ice in a plastic bag. ? Place a towel between your skin and the bag. ? Leave the ice on for 10-15 minutes, 2-3 times a day. Only do jaw exercises that your health care provider approves of. Only move your jaw as far as it can comfortably go in each direction. Do not move your jaw into positions that cause pain. Range of motion exercises Repeat each of these exercises 8 times, 1-2 times a day, or as told by your health care provider. Exercise A: Forward protrusion 1. Push your jaw forward. Hold this position for 1-2 seconds. 2. Allow your jaw to return to its normal position and rest it there for 1-2 seconds. Exercise B: Controlled opening 1. Stand or sit in front  of a mirror. Place your tongue on the roof of your mouth, just behind your top teeth. 2. Keeping your tongue on the roof of your mouth, slowly open and close your mouth. 3. While you open and close your mouth, watch your jaw in the mirror. Try to keep your jaw from moving to one side or the other. Exercise C: Right and left motion 1. Move your jaw right. Hold this position for 1-2 seconds. Allow your jaw to return to its normal position, and rest it there for 1-2 seconds. 2. Move your jaw left. Hold this position for 1-2 seconds. Allow your jaw to return to its normal position, and rest it there for 1-2 seconds. Postural exercises Exercise A: Chin tucks 1. You can do this exercise sitting, standing, or lying down. 2. Move your head straight back, keeping your head level. You can guide the movement by placing your fingers on your chin to push your jaw back in an even motion. You should be able to feel a double chin form at the end of the motion. 3. Hold this position for 5 seconds. Repeat 10-15 times. Exercise B: Shoulder blade squeeze 1. Sit or stand. 2. Bend your elbows to about 90 degrees, which is the shape of a capital letter "L." Keep your upper arms by your body. 3. Squeeze your shoulder blades down and back, as though you were trying to touch your elbows behind you. Do not shrug your shoulders or move your  head. 4. Hold this position for 5 seconds. Repeat 10-15 times. Exercise C: Chest stretch 1. Stand facing a corner. 2. Put both of your hands and your forearms on the wall, with your arms wide apart. 3. Make sure your arms are at a 90-degree angle to your body. This means that you should hold your arms straight out from your body, level with the floor. 4. Step in toward the corner. Do not lean in. 5. Hold this position for 30 seconds. Repeat 3 times. Contact a health care provider if you have:  Jaw pain that is new or gets worse.  Clicking or popping sounds while doing the  exercises. Get help right away if:  Your jaw is stuck in one place and you cannot move it.  You cannot open or close your mouth. This information is not intended to replace advice given to you by your health care provider. Make sure you discuss any questions you have with your health care provider. Document Revised: 12/05/2018 Document Reviewed: 07/10/2017 Elsevier Patient Education  [place].  Temporomandibular Joint Syndrome  Temporomandibular joint syndrome (TMJ syndrome) is a condition that causes pain in the temporomandibular joints. These joints are located near your ears and allow your jaw to open and close. For people with TMJ syndrome, chewing, biting, or other movements of the jaw can be difficult or painful. TMJ syndrome is often mild and goes away within a few weeks. However, sometimes the condition becomes a long-term (chronic) problem. What are the causes? This condition may be caused by:  Grinding your teeth or clenching your jaw. Some people do this when they are under stress.  Arthritis.  Injury to the jaw.  Head or neck injury.  Teeth or dentures that are not aligned well. In some cases, the cause of TMJ syndrome may not be known. What are the signs or symptoms? The most common symptom of this condition is an aching pain on the side of the head in the area of the TMJ. Other symptoms may include:  Pain when moving your jaw, such as when chewing or biting.  Being unable to open your jaw all the way.  Making a clicking sound when you open your mouth.  Headache.  Earache.  Neck or shoulder pain. How is this diagnosed? This condition may be diagnosed based on:  Your symptoms and medical history.  A physical exam. Your health care provider may check the range of motion of your jaw.  Imaging tests, such as X-rays or an MRI. You may also need to see your dentist, who will determine if your teeth and jaw are lined up correctly. How is this  treated? TMJ syndrome often goes away on its own. If treatment is needed, the options may include:  Eating soft foods and applying ice or heat.  Medicines to relieve pain or inflammation.  Medicines or massage to relax the muscles.  A splint, bite plate, or mouthpiece to prevent teeth grinding or jaw clenching.  Relaxation techniques or counseling to help reduce stress.  A therapy for pain in which an electrical current is applied to the nerves through the skin (transcutaneous electrical nerve stimulation).  Acupuncture. This is sometimes helpful to relieve pain.  Jaw surgery. This is rarely needed. Follow these instructions at home:  Eating and drinking  Eat a soft diet if you are having trouble chewing.  Avoid foods that require a lot of chewing. Do not chew gum. General instructions  Take over-the-counter and prescription medicines  only as told by your health care provider.  If directed, put ice on the painful area. ? Put ice in a plastic bag. ? Place a towel between your skin and the bag. ? Leave the ice on for 20 minutes, 2-3 times a day.  Apply a warm, wet cloth (warm compress) to the painful area as directed.  Massage your jaw area and do any jaw stretching exercises as told by your health care provider.  If you were given a splint, bite plate, or mouthpiece, wear it as told by your health care provider.  Keep all follow-up visits as told by your health care provider. This is important. Contact a health care provider if:  You are having trouble eating.  You have new or worsening symptoms. Get help right away if:  Your jaw locks open or closed. Summary  Temporomandibular joint syndrome (TMJ syndrome) is a condition that causes pain in the temporomandibular joints. These joints are located near your ears and allow your jaw to open and close.  TMJ syndrome is often mild and goes away within a few weeks. However, sometimes the condition becomes a long-term  (chronic) problem.  Symptoms include an aching pain on the side of the head in the area of the TMJ, pain when chewing or biting, and being unable to open your jaw all the way. You may also make a clicking sound when you open your mouth.  TMJ syndrome often goes away on its own. If treatment is needed, it may include medicines to relieve pain, reduce inflammation, or relax the muscles. A splint, bite plate, or mouthpiece may also be used to prevent teeth grinding or jaw clenching. This information is not intended to replace advice given to you by your health care provider. Make sure you discuss any questions you have with your health care provider. Document Revised: 10/25/2017 Document Reviewed: 09/24/2017 Elsevier Patient Education  2020 [employer].

## 2020-05-06 NOTE — Telephone Encounter (Signed)
Called sleep center they are working on it

## 2020-06-15 ENCOUNTER — Ambulatory Visit (INDEPENDENT_AMBULATORY_CARE_PROVIDER_SITE_OTHER): Payer: Medicaid Other | Admitting: Podiatry

## 2020-06-15 ENCOUNTER — Other Ambulatory Visit: Payer: Self-pay | Admitting: Podiatry

## 2020-06-15 ENCOUNTER — Other Ambulatory Visit: Payer: Self-pay

## 2020-06-15 ENCOUNTER — Ambulatory Visit (INDEPENDENT_AMBULATORY_CARE_PROVIDER_SITE_OTHER): Payer: Medicaid Other

## 2020-06-15 DIAGNOSIS — M7751 Other enthesopathy of right foot: Secondary | ICD-10-CM

## 2020-06-15 DIAGNOSIS — S9031XA Contusion of right foot, initial encounter: Secondary | ICD-10-CM

## 2020-06-15 DIAGNOSIS — M79671 Pain in right foot: Secondary | ICD-10-CM

## 2020-06-15 DIAGNOSIS — Q828 Other specified congenital malformations of skin: Secondary | ICD-10-CM | POA: Diagnosis not present

## 2020-06-15 DIAGNOSIS — M778 Other enthesopathies, not elsewhere classified: Secondary | ICD-10-CM

## 2020-06-16 ENCOUNTER — Encounter: Payer: Self-pay | Admitting: Podiatry

## 2020-06-16 NOTE — Progress Notes (Signed)
Subjective:  Patient ID: Alicia [name], female    DOB: 1980/10/29,  MRN: [id number]  Chief Complaint  Patient presents with  . Foot Pain    3-4 months right foot pain PT stated that it she has a constant burning sensation when she is on her foot     39 y.o. female presents with the above complaint.  Patient presents with complaint of right lateral foot hyperkeratotic lesion submetatarsal midshaft of the fifth.  Patient states that this has been going for 3 to 4 months as progressing on worse.  Patient said there is constant burning associated with it especially when she steps right in that area.  She states the pain is getting worse.  She started limping today.  She would like to discuss treatment options for this.  She has not seen anyone else prior to seeing me.   Review of Systems: Negative except as noted in the HPI. Denies N/V/F/Ch.  Past Medical History:  Diagnosis Date  . Anxiety   . Arthritis    Phreesia 03/08/2020  . Bladder infection   . Complication of anesthesia    Epidural did not work w/2nd preg. labor  . Depression    No med. currently, Stopped with + UPT  . Depression 06/27/2012  . Gonorrhea   . Hemorrhoids 06/27/2012  . Osteoarthritis 06/27/2012  . Osteoporosis    Phreesia 03/08/2020  . Panic disorder 06/27/2012  . Sleep apnea    Phreesia 03/08/2020    Current Outpatient Medications:  .  albuterol (VENTOLIN HFA) 108 (90 Base) MCG/ACT inhaler, Inhale 1-2 puffs into the lungs every 6 (six) hours as needed for wheezing or shortness of breath., Disp: 8 g, Rfl: 0 .  ARIPiprazole (ABILIFY) 2 MG tablet, Take 1 tablet (2 mg total) by mouth daily., Disp: 30 tablet, Rfl: 2 .  clonazePAM (KLONOPIN) 1 MG tablet, Take 1 mg by mouth 2 (two) times daily as needed for anxiety. , Disp: , Rfl:  .  cyclobenzaprine (FLEXERIL) 5 MG tablet, Take 1 tablet (5 mg total) by mouth 2 (two) times daily as needed for muscle spasms., Disp: 30 tablet, Rfl: 0 .  hydrOXYzine  (ATARAX/VISTARIL) 10 MG tablet, Take 1 tablet (10 mg total) by mouth 3 (three) times daily as needed., Disp: 60 tablet, Rfl: 2 .  pantoprazole (PROTONIX) 40 MG tablet, Take 1 tablet (40 mg total) by mouth daily., Disp: 30 tablet, Rfl: 3 .  PARoxetine (PAXIL) 20 MG tablet, Take 1 tablet (20 mg total) by mouth daily., Disp: 30 tablet, Rfl: 1 .  Vitamin D, Ergocalciferol, (DRISDOL) 1.25 MG (50000 UNIT) CAPS capsule, Take 1 capsule (50,000 Units total) by mouth every 7 (seven) days., Disp: 12 capsule, Rfl: 0  Social History   Tobacco Use  Smoking Status Current Every Day Smoker  . Packs/day: 0.25  . Types: Cigarettes  . Last attempt to quit: 06/22/2005  . Years since quitting: 14.9  Smokeless Tobacco Never Used    Allergies  Allergen Reactions  . Penicillins    Objective:  There were no vitals filed for this visit. There is no height or weight on file to calculate BMI. Constitutional Well developed. Well nourished.  Vascular Dorsalis pedis pulses palpable bilaterally. Posterior tibial pulses palpable bilaterally. Capillary refill normal to all digits.  No cyanosis or clubbing noted. Pedal hair growth normal.  Neurologic Normal speech. Oriented to person, place, and time. Epicritic sensation to light touch grossly present bilaterally.  Dermatologic  hyperkeratotic lesion with central nucleated core noted to  the right lateral foot.  No pain at the peroneal tendon no pain at the fifth metatarsophalangeal joint.  Pain on palpation to this lesion.  No pinpoint bleeding noted upon debridement.  Orthopedic: Normal joint ROM without pain or crepitus bilaterally. No visible deformities. No bony tenderness.   Radiographs: Three views of skeletally mature adult right foot: X-ray is within normal limits.  No stress fracture noted no actual fracture noted.  No other bony abnormalities identified Assessment:   1. Foot pain, right   2. Porokeratosis   3. Capsulitis of right foot    Plan:    Patient was evaluated and treated and all questions answered.  Right lateral foot submetatarsal five porokeratosis with underlying capsulitis -I explained to the patient the etiology of porokeratosis emergency room and options were discussed.  Given the amount of pain that she is having I believe patient will benefit from a steroid injection to help decrease acute inflammatory component associated with pain.  This will be followed by aggressive debridement of the lesion.  I discussed this with the patient in extensive detail.  Patient states understanding would like to proceed with a steroid injection -A steroid injection was performed at right lateral plantar foot a point of maximal tenderness using 1% plain Lidocaine and 10 mg of Kenalog. This was well tolerated.   No follow-ups on file.

## 2020-06-21 ENCOUNTER — Ambulatory Visit (HOSPITAL_BASED_OUTPATIENT_CLINIC_OR_DEPARTMENT_OTHER): Payer: Medicaid Other | Attending: Internal Medicine | Admitting: Internal Medicine

## 2020-06-21 ENCOUNTER — Other Ambulatory Visit: Payer: Self-pay

## 2020-06-21 VITALS — Ht 62.0 in | Wt 216.0 lb

## 2020-06-21 DIAGNOSIS — G4763 Sleep related bruxism: Secondary | ICD-10-CM | POA: Diagnosis not present

## 2020-06-21 DIAGNOSIS — E669 Obesity, unspecified: Secondary | ICD-10-CM | POA: Insufficient documentation

## 2020-06-21 DIAGNOSIS — G4733 Obstructive sleep apnea (adult) (pediatric): Secondary | ICD-10-CM | POA: Insufficient documentation

## 2020-06-21 DIAGNOSIS — G479 Sleep disorder, unspecified: Secondary | ICD-10-CM | POA: Diagnosis present

## 2020-06-21 DIAGNOSIS — Z6841 Body Mass Index (BMI) 40.0 and over, adult: Secondary | ICD-10-CM | POA: Diagnosis not present

## 2020-06-21 DIAGNOSIS — R0683 Snoring: Secondary | ICD-10-CM | POA: Diagnosis not present

## 2020-06-21 DIAGNOSIS — Z79899 Other long term (current) drug therapy: Secondary | ICD-10-CM | POA: Insufficient documentation

## 2020-06-29 ENCOUNTER — Other Ambulatory Visit: Payer: Self-pay | Admitting: Physician Assistant

## 2020-06-29 MED ORDER — ALBUTEROL SULFATE HFA 108 (90 BASE) MCG/ACT IN AERS
1.0000 | INHALATION_SPRAY | Freq: Four times a day (QID) | RESPIRATORY_TRACT | 2 refills | Status: DC | PRN
Start: 2020-06-29 — End: 2021-07-25

## 2020-07-03 DIAGNOSIS — G4733 Obstructive sleep apnea (adult) (pediatric): Secondary | ICD-10-CM | POA: Diagnosis not present

## 2020-07-03 NOTE — Procedures (Signed)
    Patient Name: Alicia [name], Alicia [name] Date: 06/21/2020 Gender: Female D.O.B: February 23, 1981 Age (years): 38 Referring Provider: Melina Schools DO Height (inches): 51 Interpreting Physician: Baird Lyons MD, ABSM Weight (lbs): 216 RPSGT: Zadie Rhine BMI: 40 MRN: [id number] Neck Size: 16.50  CLINICAL INFORMATION Sleep Study Type: NPSG Indication for sleep study: Non-refreshing Sleep, Obesity, Snoring Epworth Sleepiness Score: 16  SLEEP STUDY TECHNIQUE As per the AASM Manual for the Scoring of Sleep and Associated Events v2.3 (April 2016) with a hypopnea requiring 4% desaturations.  The channels recorded and monitored were frontal, central and occipital EEG, electrooculogram (EOG), submentalis EMG (chin), nasal and oral airflow, thoracic and abdominal wall motion, anterior tibialis EMG, snore microphone, electrocardiogram, and pulse oximetry.  MEDICATIONS Medications self-administered by patient taken the night of the study : [place] The study was initiated at 9:49:15 PM and ended at 4:19:35 AM.  Sleep onset time was 4.8 minutes and the sleep efficiency was 94.0%%. The total sleep time was 367.1 minutes.  Stage REM latency was 60.5 minutes.  The patient spent 3.5%% of the night in stage N1 sleep, 57.2%% in stage N2 sleep, 13.8%% in stage N3 and 25.5% in REM.  Alpha intrusion was absent.  Supine sleep was 12.42%.  RESPIRATORY PARAMETERS The overall apnea/hypopnea index (AHI) was 4.2 per hour. There were 0 total apneas, including 0 obstructive, 0 central and 0 mixed apneas. There were 26 hypopneas and 3 RERAs.  The AHI during Stage REM sleep was 15.4 per hour.  AHI while supine was 9.2 per hour.  The mean oxygen saturation was 92.3%. The minimum SpO2 during sleep was 76.0%.  soft snoring was noted during this study.  CARDIAC DATA The 2 lead EKG demonstrated sinus rhythm. The mean heart rate was 80.4 beats per minute. Other EKG findings  include: None.  LEG MOVEMENT DATA The total PLMS were 0 with a resulting PLMS index of 0.0. Associated arousal with leg movement index was 0.0 .  IMPRESSIONS - No significant obstructive sleep apnea occurred during this study (AHI = 4.2/h). - No significant central sleep apnea occurred during this study (CAI = 0.0/h). - Moderate oxygen desaturation was noted during this study (Min O2 = 76.0%). Mean O2 sat 92.5%. - The patient snored with soft snoring volume. - No cardiac abnormalities were noted during this study. - Clinically significant periodic limb movements did not occur during sleep. No significant associated arousals. - Tech reported severee bruxism throughout the study.  DIAGNOSIS - Bruxism (G47.63)  RECOMMENDATIONS - Consider oral bite guard for bruxism - Be careful with alcohol, sedatives and other CNS depressants that may worsen sleep apnea and disrupt normal sleep architecture. - Sleep hygiene should be reviewed to assess factors that may improve sleep quality. - Weight management and regular exercise should be initiated or continued if appropriate.  [Electronically signed] 07/03/2020 01:43 PM  Baird Lyons MD, [place], American Board of Sleep Medicine   NPI: [id number]                         [place], [facility]  ELECTRONICALLY SIGNED ON:  07/03/2020, 1:29 PM [place] PH: [phone]   FX: [phone] [place]

## 2020-07-11 ENCOUNTER — Other Ambulatory Visit: Payer: Self-pay

## 2020-07-11 ENCOUNTER — Telehealth (INDEPENDENT_AMBULATORY_CARE_PROVIDER_SITE_OTHER): Payer: Medicaid Other | Admitting: Family Medicine

## 2020-07-11 DIAGNOSIS — Z712 Person consulting for explanation of examination or test findings: Secondary | ICD-10-CM | POA: Diagnosis not present

## 2020-07-11 DIAGNOSIS — M549 Dorsalgia, unspecified: Secondary | ICD-10-CM | POA: Diagnosis not present

## 2020-07-11 DIAGNOSIS — G8929 Other chronic pain: Secondary | ICD-10-CM | POA: Diagnosis not present

## 2020-07-11 DIAGNOSIS — G4763 Sleep related bruxism: Secondary | ICD-10-CM

## 2020-07-11 MED ORDER — CYCLOBENZAPRINE HCL 5 MG PO TABS
5.0000 mg | ORAL_TABLET | Freq: Every day | ORAL | 3 refills | Status: DC
Start: 2020-07-11 — End: 2021-04-18

## 2020-07-11 MED ORDER — MELOXICAM 7.5 MG PO TABS: 7.5000 mg | ORAL_TABLET | Freq: Every day | ORAL | 2 refills | Status: DC

## 2020-07-11 NOTE — Progress Notes (Signed)
Virtual Visit via Telephone Note  I connected with Alicia [name] on 07/11/20 at 10:50 AM EST by telephone and verified that I am speaking with the correct person using two identifiers.  Location: Patient: Home Provider: Office   I discussed the limitations, risks, security and privacy concerns of performing an evaluation and management service by telephone and the availability of in person appointments. I also discussed with the patient that there may be a patient responsible charge related to this service. The patient expressed understanding and agreed to proceed.   History of Present Illness: Alicia [name] is present via today's encounter to discuss results of her recent sleep study. Sleep study indicates that patient no longer as obstructive sleep apnea, however suffers from Bruxism. Patient endorses significant weight loss since 2014 when she initially diagnosed with OSA. She has been off CPAP for over 3 years.  Back pain with prolonged standing.  She has been taking Excedrin although is concerned her to continue assisting cause issues with her stomach.  She denies any injury.  Last documented BM 39.5 indicating obesity.  She has tolerated muscle relaxers previously.  Observations/Objective: Speaking in clear sentences. Breathing pattern is audibly normal.  Patient is asking and responding to questions appropriately.  Assessment and Plan: Encounter to discuss test results-  -Per sleep study no evidence of OSA.recommended improving sleep hygiene, weight loss and management of teeth grinding  Bruxism, sleep-related -Purchase OTC mouth/teeth guard to where while sleeping  Chronic bilateral back pain, unspecified back location -Trial meloxicam and cyclobenzaprine PRN     Follow Up Instructions: PRN    I discussed the assessment and treatment plan with the patient. The patient was provided an opportunity to ask questions and all were answered. The patient agreed with the plan and  demonstrated an understanding of the instructions.   The patient was advised to call back or seek an in-person evaluation if the symptoms worsen or if the condition fails to improve as anticipated.  I provided 20 minutes of non-face-to-face time during this encounter.   Molli Barrows, FNP

## 2020-07-14 ENCOUNTER — Ambulatory Visit: Payer: Medicaid Other | Admitting: Obstetrics and Gynecology

## 2020-07-28 ENCOUNTER — Ambulatory Visit (INDEPENDENT_AMBULATORY_CARE_PROVIDER_SITE_OTHER): Payer: Medicaid Other | Admitting: Obstetrics and Gynecology

## 2020-07-28 ENCOUNTER — Other Ambulatory Visit (HOSPITAL_COMMUNITY)
Admission: RE | Admit: 2020-07-28 | Discharge: 2020-07-28 | Disposition: A | Discharge status: Home / Self Care | Payer: Medicaid Other | Source: Ambulatory Visit | Attending: Obstetrics and Gynecology | Admitting: Obstetrics and Gynecology

## 2020-07-28 ENCOUNTER — Encounter: Payer: Self-pay | Admitting: Obstetrics and Gynecology

## 2020-07-28 ENCOUNTER — Other Ambulatory Visit: Payer: Self-pay

## 2020-07-28 VITALS — BP 105/69 | HR 83 | Wt 222.0 lb

## 2020-07-28 DIAGNOSIS — Z01419 Encounter for gynecological examination (general) (routine) without abnormal findings: Secondary | ICD-10-CM | POA: Diagnosis present

## 2020-07-28 MED ORDER — SLYND 4 MG PO TABS: 1.0000 | ORAL_TABLET | Freq: Every day | ORAL | 4 refills | Status: DC

## 2020-07-28 NOTE — Progress Notes (Signed)
GYN patient presents for visit to discuss fibroids per pt here for Annual and to discuss fibroids.  Last pap: 08/23/16 WNL Family Hx of Breat cancer; None STD Screening:Full panel per pt Contraception: Condoms, None desired    LMP: 07/11/20 cycles usually last 5-6 days w/ Heavy flow, clot and cramps. Pt states she goes through about 5-7 pads in a day.   Pt last U/S 02//05/21 ordered by Dr.Harper had virtual visit w/Dr.Harper on 10/09/19 to discuss U/S findings.

## 2020-07-28 NOTE — Progress Notes (Signed)
Subjective:     Alicia [name] is a 39 y.o. female P3 with LMP 07/11/20 and BMI 40 who is here for a comprehensive physical exam. The patient reports no problems. Patient reports a monthly period lasting 5-7 days associated with dysmenorrhea and heavy flow. She is sexually active using condoms for contraception. She denies pelvic pain or abnormal discharge. Patient has not tried treating her dysmenorrhea with any OTC medication. She denies urinary incontinence. She continues her efforts to discontinue tobacco smoking. Patient is interested in hormonal contraception  Past Medical History:  Diagnosis Date  . Anxiety   . Arthritis    Phreesia 03/08/2020  . Bladder infection   . Complication of anesthesia    Epidural did not work w/2nd preg. labor  . Depression    No med. currently, Stopped with + UPT  . Depression 06/27/2012  . Gonorrhea   . Hemorrhoids 06/27/2012  . Osteoarthritis 06/27/2012  . Osteoporosis    [facility]reesia 03/08/2020  . Panic disorder 06/27/2012  . Sleep apnea    Phreesia 03/08/2020   Past Surgical History:  Procedure Laterality Date  . BREAST MASS EXCISION     Benign  . BREAST SURGERY N/A    Phreesia 03/08/2020  . CESAREAN SECTION     X 1 1st preg., then VBAC  . CESAREAN SECTION N/A 01/27/2013   Procedure: CESAREAN SECTION;  Surgeon: Shelly Bombard, MD;  Location: [facility];  Service: Obstetrics;  Laterality: N/A;  . MOUTH SURGERY     Family History  Problem Relation Age of Onset  . Cancer Mother   . Other Neg Hx   . Alcohol abuse Neg Hx   . Arthritis Neg Hx   . Asthma Neg Hx   . Birth defects Neg Hx   . COPD Neg Hx   . Depression Neg Hx   . Drug abuse Neg Hx   . Diabetes Neg Hx   . Early death Neg Hx   . Hearing loss Neg Hx   . Heart disease Neg Hx   . Hyperlipidemia Neg Hx   . Hypertension Neg Hx   . Kidney disease Neg Hx   . Learning disabilities Neg Hx   . Mental illness Neg Hx   . Mental retardation Neg Hx   . Miscarriages / Stillbirths Neg Hx    . Stroke Neg Hx   . Vision loss Neg Hx     Social History   Socioeconomic History  . Marital status: Widowed    Spouse name: Not on file  . Number of children: Not on file  . Years of education: Not on file  . Highest education level: Not on file  Occupational History  . Not on file  Tobacco Use  . Smoking status: Current Every Day Smoker    Packs/day: 0.25    Types: Cigarettes    Last attempt to quit: 06/22/2005    Years since quitting: 15.1  . Smokeless tobacco: Never Used  Vaping Use  . Vaping Use: Never used  Substance and Sexual Activity  . Alcohol use: Yes    Comment: Every other month  . Drug use: No  . Sexual activity: Yes    Birth control/protection: None    Comment: Last intercourse at least 1 week ago  Other Topics Concern  . Not on file  Social History Narrative  . Not on file   Social Determinants of Health   Financial Resource Strain:   . Difficulty of Paying Living Expenses: Not on file  Food Insecurity:   . Worried About Charity fundraiser in the Last Year: Not on file  . Ran Out of Food in the Last Year: Not on file  Transportation Needs:   . Lack of Transportation (Medical): Not on file  . Lack of Transportation (Non-Medical): Not on file  Physical Activity:   . Days of Exercise per Week: Not on file  . Minutes of Exercise per Session: Not on file  Stress:   . Feeling of Stress : Not on file  Social Connections:   . Frequency of Communication with Friends and Family: Not on file  . Frequency of Social Gatherings with Friends and Family: Not on file  . Attends Religious Services: Not on file  . Active Member of Clubs or Organizations: Not on file  . Attends Archivist Meetings: Not on file  . Marital Status: Not on file  Intimate Partner Violence:   . Fear of Current or Ex-Partner: Not on file  . Emotionally Abused: Not on file  . Physically Abused: Not on file  . Sexually Abused: Not on file   Health Maintenance  Topic  Date Due  . COVID-19 Vaccine (1) Never done  . PAP SMEAR-Modifier  08/24/2019  . INFLUENZA VACCINE  Never done  . TETANUS/TDAP  01/29/2023  . Hepatitis C Screening  Completed  . HIV Screening  Completed       Review of Systems Pertinent items noted in HPI and remainder of comprehensive ROS otherwise negative.   Objective:  Blood pressure 105/69, pulse 83, weight 222 lb (100.7 kg), last menstrual period 07/11/2020.     GENERAL: Well-developed, well-nourished female in no acute distress.  HEENT: Normocephalic, atraumatic. Sclerae anicteric.  NECK: Supple. Normal thyroid.  LUNGS: Clear to auscultation bilaterally.  HEART: Regular rate and rhythm. BREASTS: Symmetric in size. No palpable masses or lymphadenopathy, skin changes, or nipple drainage. ABDOMEN: Soft, nontender, nondistended. No organomegaly. PELVIC: Normal external female genitalia. Vagina is pink and rugated.  Normal discharge. Normal appearing cervix. Uterus is normal in size.  No adnexal mass or tenderness. EXTREMITIES: No cyanosis, clubbing, or edema, 2+ distal pulses.    Assessment:    Healthy female exam.      Plan:    Pap smear collected STD per patient request Discussed taking ibuprofen a few days prior to onset of menses to ease dysmenorrhea Discussed hormonal contraception (POP, Depo-provera and levonorgestrel IUD) and patient opted for progesterone only pills Patient will be contacted with abnormal results See After Visit Summary for Counseling Recommendations

## 2020-07-29 ENCOUNTER — Ambulatory Visit (INDEPENDENT_AMBULATORY_CARE_PROVIDER_SITE_OTHER): Payer: Medicaid Other | Admitting: Podiatry

## 2020-07-29 DIAGNOSIS — M7751 Other enthesopathy of right foot: Secondary | ICD-10-CM

## 2020-07-29 DIAGNOSIS — Q828 Other specified congenital malformations of skin: Secondary | ICD-10-CM

## 2020-07-29 DIAGNOSIS — M778 Other enthesopathies, not elsewhere classified: Secondary | ICD-10-CM | POA: Diagnosis not present

## 2020-07-29 LAB — CERVICOVAGINAL ANCILLARY ONLY
Bacterial Vaginitis (gardnerella): POSITIVE — AB
Candida Glabrata: NEGATIVE
Candida Vaginitis: NEGATIVE
Chlamydia: NEGATIVE
Comment: NEGATIVE
Comment: NEGATIVE
Comment: NEGATIVE
Comment: NEGATIVE
Comment: NEGATIVE
Comment: NORMAL
Neisseria Gonorrhea: NEGATIVE
Trichomonas: NEGATIVE

## 2020-07-29 LAB — HEPATITIS C ANTIBODY: Hep C Virus Ab: 0.6 s/co ratio (ref 0.0–0.9)

## 2020-07-29 LAB — RPR: RPR Ser Ql: NONREACTIVE

## 2020-07-29 LAB — HEPATITIS B SURFACE ANTIGEN: Hepatitis B Surface Ag: NEGATIVE

## 2020-07-29 LAB — HIV ANTIBODY (ROUTINE TESTING W REFLEX): HIV Screen 4th Generation wRfx: NONREACTIVE

## 2020-08-01 MED ORDER — METRONIDAZOLE 500 MG PO TABS: 500.0000 mg | ORAL_TABLET | Freq: Two times a day (BID) | ORAL | 0 refills | Status: DC

## 2020-08-01 NOTE — Addendum Note (Signed)
Addended by: Mora Bellman on: 08/01/2020 01:07 PM   Modules accepted: Orders

## 2020-08-02 ENCOUNTER — Encounter: Payer: Self-pay | Admitting: Podiatry

## 2020-08-02 LAB — CYTOLOGY - PAP
Comment: NEGATIVE
Diagnosis: NEGATIVE
High risk HPV: NEGATIVE

## 2020-08-02 NOTE — Progress Notes (Signed)
Subjective:  Patient ID: Alicia [name], female    DOB: 08-13-1981,  MRN: [id number]  Chief Complaint  Patient presents with  . Follow-up    PT STATES THAT HER RIGHT HEEL PAIN FEELS THE SAME.     39 y.o. female presents with the above complaint.  Patient presents with complaint of right lateral foot hyperkeratotic lesion submetatarsal midshaft of the fifth. Patient states the pain is about the same. Injection helped considerably. However she states that is still painful and keeps recurring. She denies any other acute complaints. She would like to do another injection.   Review of Systems: Negative except as noted in the HPI. Denies N/V/F/Ch.  Past Medical History:  Diagnosis Date  . Anxiety   . Arthritis    Phreesia 03/08/2020  . Bladder infection   . Complication of anesthesia    Epidural did not work w/2nd preg. labor  . Depression    No med. currently, Stopped with + UPT  . Depression 06/27/2012  . Gonorrhea   . Hemorrhoids 06/27/2012  . Osteoarthritis 06/27/2012  . Osteoporosis    Phreesia 03/08/2020  . Panic disorder 06/27/2012  . Sleep apnea    Phreesia 03/08/2020    Current Outpatient Medications:  .  albuterol (VENTOLIN HFA) 108 (90 Base) MCG/ACT inhaler, Inhale 1-2 puffs into the lungs every 6 (six) hours as needed for wheezing or shortness of breath., Disp: 18 g, Rfl: 2 .  ARIPiprazole (ABILIFY) 2 MG tablet, Take 1 tablet (2 mg total) by mouth daily., Disp: 30 tablet, Rfl: 2 .  clonazePAM (KLONOPIN) 1 MG tablet, Take 1 mg by mouth 2 (two) times daily as needed for anxiety. , Disp: , Rfl:  .  cyclobenzaprine (FLEXERIL) 5 MG tablet, Take 1 tablet (5 mg total) by mouth 2 (two) times daily as needed for muscle spasms. (Patient not taking: Reported on 07/28/2020), Disp: 30 tablet, Rfl: 0 .  cyclobenzaprine (FLEXERIL) 5 MG tablet, Take 1-2 tablets (5-10 mg total) by mouth at bedtime. (Patient not taking: Reported on 07/28/2020), Disp: 60 tablet, Rfl: 3 .  Drospirenone  (SLYND) 4 MG TABS, Take 1 tablet by mouth daily., Disp: 84 tablet, Rfl: 4 .  hydrOXYzine (ATARAX/VISTARIL) 10 MG tablet, Take 1 tablet (10 mg total) by mouth 3 (three) times daily as needed., Disp: 60 tablet, Rfl: 2 .  meloxicam (MOBIC) 7.5 MG tablet, Take 1 tablet (7.5 mg total) by mouth daily., Disp: 30 tablet, Rfl: 2 .  metroNIDAZOLE (FLAGYL) 500 MG tablet, Take 1 tablet (500 mg total) by mouth 2 (two) times daily., Disp: 14 tablet, Rfl: 0 .  pantoprazole (PROTONIX) 40 MG tablet, Take 1 tablet (40 mg total) by mouth daily., Disp: 30 tablet, Rfl: 3 .  PARoxetine (PAXIL) 20 MG tablet, Take 1 tablet (20 mg total) by mouth daily., Disp: 30 tablet, Rfl: 1 .  Vitamin D, Ergocalciferol, (DRISDOL) 1.25 MG (50000 UNIT) CAPS capsule, Take 1 capsule (50,000 Units total) by mouth every 7 (seven) days., Disp: 12 capsule, Rfl: 0  Social History   Tobacco Use  Smoking Status Current Every Day Smoker  . Packs/day: 0.25  . Types: Cigarettes  . Last attempt to quit: 06/22/2005  . Years since quitting: 15.1  Smokeless Tobacco Never Used    Allergies  Allergen Reactions  . Penicillins    Objective:  There were no vitals filed for this visit. There is no height or weight on file to calculate BMI. Constitutional Well developed. Well nourished.  Vascular Dorsalis pedis pulses palpable bilaterally.  Posterior tibial pulses palpable bilaterally. Capillary refill normal to all digits.  No cyanosis or clubbing noted. Pedal hair growth normal.  Neurologic Normal speech. Oriented to person, place, and time. Epicritic sensation to light touch grossly present bilaterally.  Dermatologic  hyperkeratotic lesion with central nucleated core noted to the right lateral foot.  No pain at the peroneal tendon no pain at the fifth metatarsophalangeal joint.  Pain on palpation to this lesion.  No pinpoint bleeding noted upon debridement.  Orthopedic: Normal joint ROM without pain or crepitus bilaterally. No visible  deformities. No bony tenderness.   Radiographs: Three views of skeletally mature adult right foot: X-ray is within normal limits.  No stress fracture noted no actual fracture noted.  No other bony abnormalities identified Assessment:   1. Porokeratosis   2. Capsulitis of right foot    Plan:  Patient was evaluated and treated and all questions answered.  Right lateral foot submetatarsal five porokeratosis with underlying capsulitis -I explained to the patient the etiology of porokeratosis emergency room and options were discussed.  Given the amount of pain that she is having I believe patient will benefit from a steroid injection to help decrease acute inflammatory component associated with pain.  This will be followed by aggressive debridement of the lesion.  I discussed this with the patient in extensive detail.  Patient states understanding would like to proceed with a steroid injection -A second steroid injection was performed at right lateral plantar foot a point of maximal tenderness using 1% plain Lidocaine and 10 mg of Kenalog. This was well tolerated.   No follow-ups on file.

## 2020-09-02 ENCOUNTER — Ambulatory Visit (INDEPENDENT_AMBULATORY_CARE_PROVIDER_SITE_OTHER): Payer: Medicaid Other | Admitting: Podiatry

## 2020-09-02 ENCOUNTER — Other Ambulatory Visit: Payer: Self-pay

## 2020-09-02 DIAGNOSIS — M778 Other enthesopathies, not elsewhere classified: Secondary | ICD-10-CM

## 2020-09-02 DIAGNOSIS — D492 Neoplasm of unspecified behavior of bone, soft tissue, and skin: Secondary | ICD-10-CM

## 2020-09-02 DIAGNOSIS — M7751 Other enthesopathy of right foot: Secondary | ICD-10-CM

## 2020-09-06 ENCOUNTER — Encounter: Payer: Self-pay | Admitting: Podiatry

## 2020-09-06 NOTE — Progress Notes (Signed)
Subjective:  Patient ID: Alicia [name], female    DOB: 1981/02/22,  MRN: [id number]  Chief Complaint  Patient presents with  . Foot Pain    Right foot. PT stated that she is still having pain     40 y.o. female presents with the above complaint.  Patient presents with complaint of right lateral foot hyperkeratotic lesion submetatarsal midshaft of the fifth. Patient states the pain is about the same. Injection helped considerably. However she states that is still painful and keeps recurring. She denies any other acute complaints. She would like to do another injection.   Review of Systems: Negative except as noted in the HPI. Denies N/V/F/Ch.  Past Medical History:  Diagnosis Date  . Anxiety   . Arthritis    Phreesia 03/08/2020  . Bladder infection   . Complication of anesthesia    Epidural did not work w/2nd preg. labor  . Depression    No med. currently, Stopped with + UPT  . Depression 06/27/2012  . Gonorrhea   . Hemorrhoids 06/27/2012  . Osteoarthritis 06/27/2012  . Osteoporosis    [facility]reesia 03/08/2020  . Panic disorder 06/27/2012  . Sleep apnea    [facility]reesia 03/08/2020    Current Outpatient Medications:  .  albuterol (VENTOLIN HFA) 108 (90 Base) MCG/ACT inhaler, Inhale 1-2 puffs into the lungs every 6 (six) hours as needed for wheezing or shortness of breath., Disp: 18 g, Rfl: 2 .  ARIPiprazole (ABILIFY) 2 MG tablet, Take 1 tablet (2 mg total) by mouth daily., Disp: 30 tablet, Rfl: 2 .  clonazePAM (KLONOPIN) 1 MG tablet, Take 1 mg by mouth 2 (two) times daily as needed for anxiety. , Disp: , Rfl:  .  cyclobenzaprine (FLEXERIL) 5 MG tablet, Take 1 tablet (5 mg total) by mouth 2 (two) times daily as needed for muscle spasms. (Patient not taking: Reported on 07/28/2020), Disp: 30 tablet, Rfl: 0 .  cyclobenzaprine (FLEXERIL) 5 MG tablet, Take 1-2 tablets (5-10 mg total) by mouth at bedtime. (Patient not taking: Reported on 07/28/2020), Disp: 60 tablet, Rfl: 3 .  Drospirenone  (SLYND) 4 MG TABS, Take 1 tablet by mouth daily., Disp: 84 tablet, Rfl: 4 .  hydrOXYzine (ATARAX/VISTARIL) 10 MG tablet, Take 1 tablet (10 mg total) by mouth 3 (three) times daily as needed., Disp: 60 tablet, Rfl: 2 .  meloxicam (MOBIC) 7.5 MG tablet, Take 1 tablet (7.5 mg total) by mouth daily., Disp: 30 tablet, Rfl: 2 .  metroNIDAZOLE (FLAGYL) 500 MG tablet, Take 1 tablet (500 mg total) by mouth 2 (two) times daily., Disp: 14 tablet, Rfl: 0 .  pantoprazole (PROTONIX) 40 MG tablet, Take 1 tablet (40 mg total) by mouth daily., Disp: 30 tablet, Rfl: 3 .  PARoxetine (PAXIL) 20 MG tablet, Take 1 tablet (20 mg total) by mouth daily., Disp: 30 tablet, Rfl: 1 .  Vitamin D, Ergocalciferol, (DRISDOL) 1.25 MG (50000 UNIT) CAPS capsule, Take 1 capsule (50,000 Units total) by mouth every 7 (seven) days., Disp: 12 capsule, Rfl: 0  Social History   Tobacco Use  Smoking Status Current Every Day Smoker  . Packs/day: 0.25  . Types: Cigarettes  . Last attempt to quit: 06/22/2005  . Years since quitting: 15.2  Smokeless Tobacco Never Used    Allergies  Allergen Reactions  . Penicillins    Objective:  There were no vitals filed for this visit. There is no height or weight on file to calculate BMI. Constitutional Well developed. Well nourished.  Vascular Dorsalis pedis pulses palpable  bilaterally. Posterior tibial pulses palpable bilaterally. Capillary refill normal to all digits.  No cyanosis or clubbing noted. Pedal hair growth normal.  Neurologic Normal speech. Oriented to person, place, and time. Epicritic sensation to light touch grossly present bilaterally.  Dermatologic  hyperkeratotic lesion with central nucleated core noted to the right lateral foot.  No pain at the peroneal tendon no pain at the fifth metatarsophalangeal joint.  Pain on palpation to this lesion.  No pinpoint bleeding noted upon debridement.  Orthopedic: Normal joint ROM without pain or crepitus bilaterally. No visible  deformities. No bony tenderness.   Radiographs: Three views of skeletally mature adult right foot: X-ray is within normal limits.  No stress fracture noted no actual fracture noted.  No other bony abnormalities identified Assessment:   1. Capsulitis of right foot   2. Skin neoplasm    Plan:  Patient was evaluated and treated and all questions answered.  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  Given the amount of pain that she is having I believe patient will benefit from a steroid injection to help decrease acute inflammatory component associated with pain.  This will be followed by aggressive debridement of the lesion.  I discussed this with the patient in extensive detail.  Patient states understanding would like to proceed with a steroid injection -A  steroid injection was performed at right lateral plantar foot a point of maximal tenderness using 1% plain Lidocaine and 10 mg of Kenalog. This was well tolerated. -Cantharone therapy was applied in the standard technique to allow for destruction of the lesion.  This will be first of 3 application -If no improvement we will discuss surgical treatment options.  No follow-ups on file.

## 2020-09-23 ENCOUNTER — Encounter: Payer: Self-pay | Admitting: Podiatry

## 2020-09-23 ENCOUNTER — Ambulatory Visit (INDEPENDENT_AMBULATORY_CARE_PROVIDER_SITE_OTHER): Payer: Medicaid Other | Admitting: Podiatry

## 2020-09-23 DIAGNOSIS — D492 Neoplasm of unspecified behavior of bone, soft tissue, and skin: Secondary | ICD-10-CM

## 2020-09-23 NOTE — Progress Notes (Signed)
Subjective:  Patient ID: Alicia [name], female    DOB: Dec 07, 1980,  MRN: [id number]  Chief Complaint  Patient presents with  . Foot Pain    Right foot pain. PT stated that half way through the day her foot will still give her trouble.    40 y.o. female presents with the above complaint.  Patient presents with a follow-up of right lateral hyperkeratotic lesion/skin plasm.  She states that the chemotherapy is helping.  She would like to continue doing Cantharone therapy it is giving her relief of pain.   Review of Systems: Negative except as noted in the HPI. Denies N/V/F/Ch.  Past Medical History:  Diagnosis Date  . Anxiety   . Arthritis    [facility]rees[facility] 03/08/2020  . Bladder infection   . Complication of anesthesia    Epidural did not work w/2nd preg. labor  . Depression    No med. currently, Stopped with + UPT  . Depression 06/27/2012  . Gonorrhea   . Hemorrhoids 06/27/2012  . Osteoarthritis 06/27/2012  . Osteoporosis    [facility] 03/08/2020  . Panic disorder 06/27/2012  . Sleep apnea    [facility] 03/08/2020    Current Outpatient Medications:  .  albuterol (VENTOLIN HFA) 108 (90 Base) MCG/ACT inhaler, Inhale 1-2 puffs into the lungs every 6 (six) hours as needed for wheezing or shortness of breath., Disp: 18 g, Rfl: 2 .  ARIPiprazole (ABILIFY) 2 MG tablet, Take 1 tablet (2 mg total) by mouth daily., Disp: 30 tablet, Rfl: 2 .  clonazePAM (KLONOPIN) 1 MG tablet, Take 1 mg by mouth 2 (two) times daily as needed for anxiety. , Disp: , Rfl:  .  cyclobenzaprine (FLEXERIL) 5 MG tablet, Take 1 tablet (5 mg total) by mouth 2 (two) times daily as needed for muscle spasms. (Patient not taking: Reported on 07/28/2020), Disp: 30 tablet, Rfl: 0 .  cyclobenzaprine (FLEXERIL) 5 MG tablet, Take 1-2 tablets (5-10 mg total) by mouth at bedtime. (Patient not taking: Reported on 07/28/2020), Disp: 60 tablet, Rfl: 3 .  Drospirenone (SLYND) 4 MG TABS, Take 1 tablet by mouth daily., Disp: 84 tablet, Rfl:  4 .  hydrOXYzine (ATARAX/VISTARIL) 10 MG tablet, Take 1 tablet (10 mg total) by mouth 3 (three) times daily as needed., Disp: 60 tablet, Rfl: 2 .  meloxicam (MOBIC) 7.5 MG tablet, Take 1 tablet (7.5 mg total) by mouth daily., Disp: 30 tablet, Rfl: 2 .  metroNIDAZOLE (FLAGYL) 500 MG tablet, Take 1 tablet (500 mg total) by mouth 2 (two) times daily., Disp: 14 tablet, Rfl: 0 .  pantoprazole (PROTONIX) 40 MG tablet, Take 1 tablet (40 mg total) by mouth daily., Disp: 30 tablet, Rfl: 3 .  PARoxetine (PAXIL) 20 MG tablet, Take 1 tablet (20 mg total) by mouth daily., Disp: 30 tablet, Rfl: 1 .  Vitamin D, Ergocalciferol, (DRISDOL) 1.25 MG (50000 UNIT) CAPS capsule, Take 1 capsule (50,000 Units total) by mouth every 7 (seven) days., Disp: 12 capsule, Rfl: 0  Social History   Tobacco Use  Smoking Status Current Every Day Smoker  . Packs/day: 0.25  . Types: Cigarettes  . Last attempt to quit: 06/22/2005  . Years since quitting: 15.2  Smokeless Tobacco Never Used    Allergies  Allergen Reactions  . Penicillins    Objective:  There were no vitals filed for this visit. There is no height or weight on file to calculate BMI. Constitutional Well developed. Well nourished.  Vascular Dorsalis pedis pulses palpable bilaterally. Posterior tibial pulses palpable bilaterally. Capillary  refill normal to all digits.  No cyanosis or clubbing noted. Pedal hair growth normal.  Neurologic Normal speech. Oriented to person, place, and time. Epicritic sensation to light touch grossly present bilaterally.  Dermatologic  hyperkeratotic lesion with central nucleated core noted to the right lateral foot.  No pain at the peroneal tendon no pain at the fifth metatarsophalangeal joint.  Pain on palpation to this lesion.  No pinpoint bleeding noted upon debridement.  Orthopedic: Normal joint ROM without pain or crepitus bilaterally. No visible deformities. No bony tenderness.   Radiographs: Three views of  skeletally mature adult right foot: X-ray is within normal limits.  No stress fracture noted no actual fracture noted.  No other bony abnormalities identified Assessment:   1. Skin neoplasm    Plan:  Patient was evaluated and treated and all questions answered.  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  I will hold off on steroid injection for now. -Today will be the second application of 3 with Cantharone therapy. --Lesion was debrided today without complications. Hemostasis was achieved and the area was cleaned. Cantharone was applied followed by an occlusive bandage. Post procedure complications were discussed. Monitor for signs or symptoms of infection and directed to call the office mainly should any occur.   No follow-ups on file.

## 2020-09-24 ENCOUNTER — Encounter: Payer: Self-pay | Admitting: Podiatry

## 2020-10-07 ENCOUNTER — Ambulatory Visit (INDEPENDENT_AMBULATORY_CARE_PROVIDER_SITE_OTHER): Payer: Medicaid Other | Admitting: Podiatry

## 2020-10-07 ENCOUNTER — Other Ambulatory Visit: Payer: Self-pay

## 2020-10-07 DIAGNOSIS — B351 Tinea unguium: Secondary | ICD-10-CM | POA: Diagnosis not present

## 2020-10-07 DIAGNOSIS — M778 Other enthesopathies, not elsewhere classified: Secondary | ICD-10-CM

## 2020-10-11 ENCOUNTER — Encounter: Payer: Self-pay | Admitting: Podiatry

## 2020-10-11 NOTE — Progress Notes (Signed)
Subjective:  Patient ID: Alicia [name], female    DOB: 10-Jun-1981,  MRN: [id number]  Chief Complaint  Patient presents with  . Foot Pain    Pt stated that she is doing well she stated that her skin is starting to peel. She denies pain at this time and has no concerns.     40 y.o. female presents with the above complaint.  Patient presents with a follow-up of right lateral hyperkeratotic lesion/skin plasm.  She states she is doing well.  She states her pain is completely gone.  She has secondary complaint of right second and fifth digit nail fungus.  She has tried some over-the-counter medication but has not helped.  She would like to know what is going on if there is any treatment options and discuss nail fungus treatment options.   Review of Systems: Negative except as noted in the HPI. Denies N/V/F/Ch.  Past Medical History:  Diagnosis Date  . Anxiety   . Arthritis    [facility] 03/08/2020  . Bladder infection   . Complication of anesthesia    Epidural did not work w/2nd preg. labor  . Depression    No med. currently, Stopped with + UPT  . Depression 06/27/2012  . Gonorrhea   . Hemorrhoids 06/27/2012  . Osteoarthritis 06/27/2012  . Osteoporosis    [facility] 03/08/2020  . Panic disorder 06/27/2012  . Sleep apnea    [facility] 03/08/2020    Current Outpatient Medications:  .  albuterol (VENTOLIN HFA) 108 (90 Base) MCG/ACT inhaler, Inhale 1-2 puffs into the lungs every 6 (six) hours as needed for wheezing or shortness of breath., Disp: 18 g, Rfl: 2 .  ARIPiprazole (ABILIFY) 2 MG tablet, Take 1 tablet (2 mg total) by mouth daily., Disp: 30 tablet, Rfl: 2 .  clonazePAM (KLONOPIN) 1 MG tablet, Take 1 mg by mouth 2 (two) times daily as needed for anxiety. , Disp: , Rfl:  .  cyclobenzaprine (FLEXERIL) 5 MG tablet, Take 1 tablet (5 mg total) by mouth 2 (two) times daily as needed for muscle spasms. (Patient not taking: Reported on 07/28/2020), Disp: 30 tablet, Rfl: 0 .  cyclobenzaprine  (FLEXERIL) 5 MG tablet, Take 1-2 tablets (5-10 mg total) by mouth at bedtime. (Patient not taking: Reported on 07/28/2020), Disp: 60 tablet, Rfl: 3 .  Drospirenone (SLYND) 4 MG TABS, Take 1 tablet by mouth daily., Disp: 84 tablet, Rfl: 4 .  hydrOXYzine (ATARAX/VISTARIL) 10 MG tablet, Take 1 tablet (10 mg total) by mouth 3 (three) times daily as needed., Disp: 60 tablet, Rfl: 2 .  meloxicam (MOBIC) 7.5 MG tablet, Take 1 tablet (7.5 mg total) by mouth daily., Disp: 30 tablet, Rfl: 2 .  metroNIDAZOLE (FLAGYL) 500 MG tablet, Take 1 tablet (500 mg total) by mouth 2 (two) times daily., Disp: 14 tablet, Rfl: 0 .  pantoprazole (PROTONIX) 40 MG tablet, Take 1 tablet (40 mg total) by mouth daily., Disp: 30 tablet, Rfl: 3 .  PARoxetine (PAXIL) 20 MG tablet, Take 1 tablet (20 mg total) by mouth daily., Disp: 30 tablet, Rfl: 1 .  Vitamin D, Ergocalciferol, (DRISDOL) 1.25 MG (50000 UNIT) CAPS capsule, Take 1 capsule (50,000 Units total) by mouth every 7 (seven) days., Disp: 12 capsule, Rfl: 0  Social History   Tobacco Use  Smoking Status Current Every Day Smoker  . Packs/day: 0.25  . Types: Cigarettes  . Last attempt to quit: 06/22/2005  . Years since quitting: 15.3  Smokeless Tobacco Never Used    Allergies  Allergen  Reactions  . Penicillins    Objective:  There were no vitals filed for this visit. There is no height or weight on file to calculate BMI. Constitutional Well developed. Well nourished.  Vascular Dorsalis pedis pulses palpable bilaterally. Posterior tibial pulses palpable bilaterally. Capillary refill normal to all digits.  No cyanosis or clubbing noted. Pedal hair growth normal.  Neurologic Normal speech. Oriented to person, place, and time. Epicritic sensation to light touch grossly present bilaterally.  Dermatologic  no further hyperkeratotic lesion with central nucleated core noted to the right lateral foot.  No pain at the peroneal tendon no pain at the fifth  metatarsophalangeal joint.  Now pain on palpation to this lesion.  No pinpoint bleeding noted upon debridement.  Thickened elongated dystrophic toenail noted to the right second and fifth digit.  Mild pain on palpation.  Mycotic nature to it.  Orthopedic: Normal joint ROM without pain or crepitus bilaterally. No visible deformities. No bony tenderness.   Radiographs: Three views of skeletally mature adult right foot: X-ray is within normal limits.  No stress fracture noted no actual fracture noted.  No other bony abnormalities identified Assessment:   1. Nail fungus   2. Onychomycosis due to dermatophyte    Plan:  Patient was evaluated and treated and all questions answered.  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  I explained to the patient that there is 3 treatment options available to treat the onychomycosis including topical, p.o., laser treatment.  Patient elected to undergo p.o. options with Lamisil/terbinafine therapy.  In order for me to start the medication therapy, I explained to the patient the importance of evaluating the liver and obtaining the liver function test.  Once the liver function test comes back normal I will start him on 43-month course of Lamisil therapy.  Patient understood all risk and would like to proceed with Lamisil therapy.  I have asked the patient to immediately stop the Lamisil therapy if she has any reactions to it and call the office or go to the emergency room right away.  Patient states understanding    No follow-ups on file.

## 2021-01-03 ENCOUNTER — Ambulatory Visit: Payer: Medicaid Other | Admitting: Physician Assistant

## 2021-01-03 ENCOUNTER — Other Ambulatory Visit: Payer: Self-pay

## 2021-01-03 ENCOUNTER — Other Ambulatory Visit: Payer: Self-pay | Admitting: Physician Assistant

## 2021-01-03 VITALS — BP 109/53 | HR 79 | Temp 98.7°F | Resp 18 | Ht 62.0 in | Wt 217.0 lb

## 2021-01-03 DIAGNOSIS — R748 Abnormal levels of other serum enzymes: Secondary | ICD-10-CM

## 2021-01-03 DIAGNOSIS — K219 Gastro-esophageal reflux disease without esophagitis: Secondary | ICD-10-CM

## 2021-01-03 DIAGNOSIS — F411 Generalized anxiety disorder: Secondary | ICD-10-CM | POA: Diagnosis not present

## 2021-01-03 DIAGNOSIS — F319 Bipolar disorder, unspecified: Secondary | ICD-10-CM | POA: Diagnosis not present

## 2021-01-03 DIAGNOSIS — E559 Vitamin D deficiency, unspecified: Secondary | ICD-10-CM | POA: Diagnosis not present

## 2021-01-03 MED ORDER — PANTOPRAZOLE SODIUM 40 MG PO TBEC: 40.0000 mg | DELAYED_RELEASE_TABLET | Freq: Every day | ORAL | 3 refills | Status: DC

## 2021-01-03 MED ORDER — PAROXETINE HCL 10 MG PO TABS: ORAL_TABLET | ORAL | 1 refills | Status: DC

## 2021-01-03 MED ORDER — ARIPIPRAZOLE 2 MG PO TABS: 2.0000 mg | ORAL_TABLET | Freq: Every day | ORAL | 2 refills | Status: DC

## 2021-01-03 MED ORDER — HYDROXYZINE HCL 10 MG PO TABS: 10.0000 mg | ORAL_TABLET | Freq: Three times a day (TID) | ORAL | 2 refills | Status: DC | PRN

## 2021-01-03 NOTE — Patient Instructions (Addendum)
I restarted your medications for you, you will start with 10 mg of Paxil for 1 week and then increase to 20 mg.  I have started a referral for you to be seen by psychiatry for medication management of your Paxil and your Abilify.  I encourage you to take the Protonix on a daily basis and continue to avoid NSAIDs and foods that trigger your heartburn and acid reflux.  I encourage you to schedule an appointment with your primary care provider for a checkup in the next 2 to 3 months.  I encourage you to follow-up with your foot doctor and let them know that the lab was completed.  Please let us know if there is any else we can do for you.  Kennieth Rad, PA-C Physician Assistant [facility] [email or web address]    Health Maintenance, Female Adopting a healthy lifestyle and getting preventive care are important in promoting health and wellness. Ask your health care provider about:  The right schedule for you to have regular tests and exams.  Things you can do on your own to prevent diseases and keep yourself healthy. What should I know about diet, weight, and exercise? Eat a healthy diet  Eat a diet that includes plenty of vegetables, fruits, low-fat dairy products, and lean protein.  Do not eat a lot of foods that are high in solid fats, added sugars, or sodium.   Maintain a healthy weight Body mass index (BMI) is used to identify weight problems. It estimates body fat based on height and weight. Your health care provider can help determine your BMI and help you achieve or maintain a healthy weight. Get regular exercise Get regular exercise. This is one of the most important things you can do for your health. Most adults should:  Exercise for at least 150 minutes each week. The exercise should increase your heart rate and make you sweat (moderate-intensity exercise).  Do strengthening exercises at least twice a week. This is in  addition to the moderate-intensity exercise.  Spend less time sitting. Even light physical activity can be beneficial. Watch cholesterol and blood lipids Have your blood tested for lipids and cholesterol at 40 years of age, then have this test every 5 years. Have your cholesterol levels checked more often if:  Your lipid or cholesterol levels are high.  You are older than 40 years of age.  You are at high risk for heart disease. What should I know about cancer screening? Depending on your health history and family history, you may need to have cancer screening at various ages. This may include screening for:  Breast cancer.  Cervical cancer.  Colorectal cancer.  Skin cancer.  Lung cancer. What should I know about heart disease, diabetes, and high blood pressure? Blood pressure and heart disease  High blood pressure causes heart disease and increases the risk of stroke. This is more likely to develop in people who have high blood pressure readings, are of African descent, or are overweight.  Have your blood pressure checked: ? Every 3-5 years if you are 68-65 years of age. ? Every year if you are 61 years old or older. Diabetes Have regular diabetes screenings. This checks your fasting blood sugar level. Have the screening done:  Once every three years after age 51 if you are at a normal weight and have a low risk for diabetes.  More often and at a younger age if you are overweight or have a high risk for diabetes. What  should I know about preventing infection? Hepatitis B If you have a higher risk for hepatitis B, you should be screened for this virus. Talk with your health care provider to find out if you are at risk for hepatitis B infection. Hepatitis C Testing is recommended for:  Everyone born from [birth year] through 1965.  Anyone with known risk factors for hepatitis C. Sexually transmitted infections (STIs)  Get screened for STIs, including gonorrhea and chlamydia,  if: ? You are sexually active and are younger than 40 years of age. ? You are older than 40 years of age and your health care provider tells you that you are at risk for this type of infection. ? Your sexual activity has changed since you were last screened, and you are at increased risk for chlamydia or gonorrhea. Ask your health care provider if you are at risk.  Ask your health care provider about whether you are at high risk for HIV. Your health care provider may recommend a prescription medicine to help prevent HIV infection. If you choose to take medicine to prevent HIV, you should first get tested for HIV. You should then be tested every 3 months for as long as you are taking the medicine. Pregnancy  If you are about to stop having your period (premenopausal) and you may become pregnant, seek counseling before you get pregnant.  Take 400 to 800 micrograms (mcg) of folic acid every day if you become pregnant.  Ask for birth control (contraception) if you want to prevent pregnancy. Osteoporosis and menopause Osteoporosis is a disease in which the bones lose minerals and strength with aging. This can result in bone fractures. If you are 35 years old or older, or if you are at risk for osteoporosis and fractures, ask your health care provider if you should:  Be screened for bone loss.  Take a calcium or vitamin D supplement to lower your risk of fractures.  Be given hormone replacement therapy (HRT) to treat symptoms of menopause. Follow these instructions at home: Lifestyle  Do not use any products that contain nicotine or tobacco, such as cigarettes, e-cigarettes, and chewing tobacco. If you need help quitting, ask your health care provider.  Do not use street drugs.  Do not share needles.  Ask your health care provider for help if you need support or information about quitting drugs. Alcohol use  Do not drink alcohol if: ? Your health care provider tells you not to  drink. ? You are pregnant, may be pregnant, or are planning to become pregnant.  If you drink alcohol: ? Limit how much you use to 0-1 drink a day. ? Limit intake if you are breastfeeding.  Be aware of how much alcohol is in your drink. In the [place] one drink equals one 12 oz bottle of beer (355 mL), one 5 oz glass of wine (148 mL), or one 1 oz glass of hard liquor (44 mL). General instructions  Schedule regular health, dental, and eye exams.  Stay current with your vaccines.  Tell your health care provider if: ? You often feel depressed. ? You have ever been abused or do not feel safe at home. Summary  Adopting a healthy lifestyle and getting preventive care are important in promoting health and wellness.  Follow your health care provider's instructions about healthy diet, exercising, and getting tested or screened for diseases.  Follow your health care provider's instructions on monitoring your cholesterol and blood pressure. This information is not intended to replace  advice given to you by your health care provider. Make sure you discuss any questions you have with your health care provider. Document Revised: 08/06/2018 Document Reviewed: 08/06/2018 Elsevier Patient Education  2021 [employer].

## 2021-01-03 NOTE — Progress Notes (Signed)
Patient presents for refill of Protonix and hydroxyzine. Patient has not taken medication today. Patient has eaten today, patient had chicken broccoli bowl. Patient complains of acid being present right now.

## 2021-01-03 NOTE — Progress Notes (Signed)
Established Patient Office Visit  Subjective:  Patient ID: Alicia [name], female    DOB: 1980-11-05  Age: 40 y.o. MRN: [id number]  CC:  Chief Complaint  Patient presents with  . Medication Refill    HPI Alicia [name] reports that she has been out of her medication for several months, states that it has been difficult for her to keep her office visits due to her work schedule.  Reports that she has been having increased heartburn and acid reflux.  Does endorse that she was using Mobic to help with back pain.  Reports that she does want to restart her Abilify and her Paxil, states that she feels her moods have been stable, however she does endorse that she felt much better while taking those medications.  Adamantly denies any thoughts of self-harm.  Reports that she has been having right foot pain, states that she is being followed by podiatry for this issue.  Reports that they requested hepatic function panel completed.    Past Medical History:  Diagnosis Date  . Anxiety   . Arthritis    [employer]reesia 03/08/2020  . Bladder infection   . Complication of anesthesia    Epidural did not work w/2nd preg. labor  . Depression    No med. currently, Stopped with + UPT  . Depression 06/27/2012  . Gonorrhea   . Hemorrhoids 06/27/2012  . Osteoarthritis 06/27/2012  . Osteoporosis    [employer]reesia 03/08/2020  . Panic disorder 06/27/2012  . Sleep apnea    [employer]reesia 03/08/2020    Past Surgical History:  Procedure Laterality Date  . BREAST MASS EXCISION     Benign  . BREAST SURGERY N/A    [employer]reesia 03/08/2020  . CESAREAN SECTION     X 1 1st preg., then VBAC  . CESAREAN SECTION N/A 01/27/2013   Procedure: CESAREAN SECTION;  Surgeon: Shelly Bombard, MD;  Location: [facility];  Service: Obstetrics;  Laterality: N/A;  . MOUTH SURGERY      Family History  Problem Relation Age of Onset  . Cancer Mother   . Other Neg Hx   . Alcohol abuse Neg Hx   . Arthritis Neg Hx   . Asthma Neg Hx   .  Birth defects Neg Hx   . COPD Neg Hx   . Depression Neg Hx   . Drug abuse Neg Hx   . Diabetes Neg Hx   . Early death Neg Hx   . Hearing loss Neg Hx   . Heart disease Neg Hx   . Hyperlipidemia Neg Hx   . Hypertension Neg Hx   . Kidney disease Neg Hx   . Learning disabilities Neg Hx   . Mental illness Neg Hx   . Mental retardation Neg Hx   . Miscarriages / Stillbirths Neg Hx   . Stroke Neg Hx   . Vision loss Neg Hx     Social History   Socioeconomic History  . Marital status: Widowed    Spouse name: Not on file  . Number of children: Not on file  . Years of education: Not on file  . Highest education level: Not on file  Occupational History  . Not on file  Tobacco Use  . Smoking status: Current Every Day Smoker    Packs/day: 0.50    Types: Cigarettes  . Smokeless tobacco: Never Used  Vaping Use  . Vaping Use: Never used  Substance and Sexual Activity  . Alcohol use: Yes    Comment: Every  other month  . Drug use: No  . Sexual activity: Yes    Birth control/protection: None    Comment: Last intercourse at least 1 week ago  Other Topics Concern  . Not on file  Social History Narrative  . Not on file   Social Determinants of Health   Financial Resource Strain: Not on file  Food Insecurity: Not on file  Transportation Needs: Not on file  Physical Activity: Not on file  Stress: Not on file  Social Connections: Not on file  Intimate Partner Violence: Not on file    Outpatient Medications Prior to Visit  Medication Sig Dispense Refill  . albuterol (VENTOLIN HFA) 108 (90 Base) MCG/ACT inhaler Inhale 1-2 puffs into the lungs every 6 (six) hours as needed for wheezing or shortness of breath. 18 g 2  . clonazePAM (KLONOPIN) 1 MG tablet Take 1 mg by mouth 2 (two) times daily as needed for anxiety.     . cyclobenzaprine (FLEXERIL) 5 MG tablet Take 1 tablet (5 mg total) by mouth 2 (two) times daily as needed for muscle spasms. 30 tablet 0  . cyclobenzaprine (FLEXERIL) 5  MG tablet Take 1-2 tablets (5-10 mg total) by mouth at bedtime. 60 tablet 3  . Drospirenone (SLYND) 4 MG TABS Take 1 tablet by mouth daily. 84 tablet 4  . Vitamin D, Ergocalciferol, (DRISDOL) 1.25 MG (50000 UNIT) CAPS capsule Take 1 capsule (50,000 Units total) by mouth every 7 (seven) days. 12 capsule 0  . ARIPiprazole (ABILIFY) 2 MG tablet Take 1 tablet (2 mg total) by mouth daily. 30 tablet 2  . hydrOXYzine (ATARAX/VISTARIL) 10 MG tablet Take 1 tablet (10 mg total) by mouth 3 (three) times daily as needed. 60 tablet 2  . meloxicam (MOBIC) 7.5 MG tablet Take 1 tablet (7.5 mg total) by mouth daily. 30 tablet 2  . pantoprazole (PROTONIX) 40 MG tablet Take 1 tablet (40 mg total) by mouth daily. 30 tablet 3  . PARoxetine (PAXIL) 20 MG tablet Take 1 tablet (20 mg total) by mouth daily. 30 tablet 1  . metroNIDAZOLE (FLAGYL) 500 MG tablet Take 1 tablet (500 mg total) by mouth 2 (two) times daily. 14 tablet 0   No facility-administered medications prior to visit.    Allergies  Allergen Reactions  . Penicillins     ROS Review of Systems  Constitutional: Negative for chills and fever.  HENT: Negative.   Eyes: Negative.   Respiratory: Negative for shortness of breath.   Cardiovascular: Negative for chest pain.  Gastrointestinal: Negative for abdominal pain, diarrhea and nausea.  Endocrine: Negative.   Genitourinary: Negative.   Musculoskeletal: Positive for arthralgias.  Skin: Negative.   Allergic/Immunologic: Negative.   Neurological: Negative for headaches.  Psychiatric/Behavioral: Negative for dysphoric mood, self-injury and sleep disturbance. The patient is not nervous/anxious.       Objective:    Physical Exam Vitals and nursing note reviewed.  Constitutional:      Appearance: Normal appearance.  HENT:     Head: Normocephalic and atraumatic.     Right Ear: External ear normal.     Left Ear: External ear normal.     Nose: Nose normal.     Mouth/Throat:     Mouth: Mucous  membranes are moist.     Pharynx: Oropharynx is clear.  Eyes:     Extraocular Movements: Extraocular movements intact.     Conjunctiva/sclera: Conjunctivae normal.     Pupils: Pupils are equal, round, and reactive to light.  Cardiovascular:  Rate and Rhythm: Normal rate and regular rhythm.     Pulses: Normal pulses.     Heart sounds: Normal heart sounds.  Pulmonary:     Effort: Pulmonary effort is normal.     Breath sounds: Normal breath sounds.  Abdominal:     General: Abdomen is flat.     Tenderness: There is no abdominal tenderness.  Musculoskeletal:        General: Normal range of motion.     Cervical back: Normal range of motion and neck supple.  Skin:    General: Skin is warm and dry.  Neurological:     General: No focal deficit present.     Mental Status: She is alert and oriented to person, place, and time.  Psychiatric:        Mood and Affect: Mood normal.        Behavior: Behavior normal.        Thought Content: Thought content normal.        Judgment: Judgment normal.     BP (!) 109/53 (BP Location: Left Arm, Patient Position: Sitting, Cuff Size: Normal)   Pulse 79   Temp 98.7 F (37.1 C) (Oral)   Resp 18   Ht 5\' 2"  (1.575 m)   Wt 217 lb (98.4 kg)   LMP 12/26/2020   SpO2 98%   BMI 39.69 kg/m  Wt Readings from Last 3 Encounters:  01/03/21 217 lb (98.4 kg)  07/28/20 222 lb (100.7 kg)  06/21/20 216 lb (98 kg)     Health Maintenance Due  Topic Date Due  . COVID-19 Vaccine (1) Never done    There are no preventive care reminders to display for this patient.  Lab Results  Component Value Date   TSH 1.350 01/26/2020   Lab Results  Component Value Date   WBC 6.2 01/26/2020   HGB 12.9 01/26/2020   HCT 40.6 01/26/2020   MCV 82 01/26/2020   PLT 286 01/26/2020   Lab Results  Component Value Date   NA 139 01/26/2020   K 3.8 01/26/2020   CO2 24 09/16/2019   GLUCOSE 101 (H) 01/26/2020   BUN 7 01/26/2020   CREATININE 0.67 01/26/2020   BILITOT  0.7 01/03/2021   ALKPHOS 63 01/03/2021   AST 19 01/03/2021   ALT 15 01/03/2021   PROT 7.0 01/03/2021   ALBUMIN 4.0 01/03/2021   CALCIUM 9.5 01/26/2020   ANIONGAP 11 09/16/2019   Lab Results  Component Value Date   CHOL 188 01/26/2020   Lab Results  Component Value Date   HDL 47 01/26/2020   Lab Results  Component Value Date   LDLCALC 130 (H) 01/26/2020   Lab Results  Component Value Date   TRIG 58 01/26/2020   Lab Results  Component Value Date   CHOLHDL 4.0 01/26/2020   No results found for: HGBA1C    Assessment & Plan:   Problem List Items Addressed This Visit      Digestive   GERD without esophagitis - Primary   Relevant Medications   pantoprazole (PROTONIX) 40 MG tablet     Other   Generalized anxiety disorder   Relevant Medications   hydrOXYzine (ATARAX/VISTARIL) 10 MG tablet   PARoxetine (PAXIL) 10 MG tablet   Bipolar depression (HCC)   Relevant Medications   ARIPiprazole (ABILIFY) 2 MG tablet   Other Relevant Orders   Ambulatory referral to Psychiatry   Vitamin D deficiency   Relevant Orders   Vitamin D, 25-hydroxy (Completed)  Abnormal liver enzymes   Relevant Orders   Hepatic Function Panel (Completed)    1. GERD without esophagitis Resume Protonix.  Patient strongly encouraged to avoid NSAIDs, continue with GERD lifestyle modifications. - pantoprazole (PROTONIX) 40 MG tablet; Take 1 tablet (40 mg total) by mouth daily.  Dispense: 30 tablet; Refill: 3  2. Generalized anxiety disorder Resume hydroxyzine, Paxil, titration instructions given on Paxil. - hydrOXYzine (ATARAX/VISTARIL) 10 MG tablet; Take 1 tablet (10 mg total) by mouth 3 (three) times daily as needed.  Dispense: 60 tablet; Refill: 2 - PARoxetine (PAXIL) 10 MG tablet; Take 1 tab PO once daily  for one week then increase to 2 tabs PO once daily  Dispense: 60 tablet; Refill: 1  3. Bipolar depression ([place]) Resume Abilify, refer to psychiatry for medication management, CBT -  ARIPiprazole (ABILIFY) 2 MG tablet; Take 1 tablet (2 mg total) by mouth daily.  Dispense: 30 tablet; Refill: 2 - Ambulatory referral to Psychiatry  4. Vitamin D deficiency  - Vitamin D, 25-hydroxy  5. Abnormal liver enzymes Labs completed on patient's behalf, - Hepatic Function Panel  Patient was strongly encouraged to make an appointment to reestablish care at [facility], patient understands and agrees, continue follow-up with podiatry.     I have reviewed the patient's medical history (PMH, PSH, Social History, Family History, Medications, and allergies) , and have been updated if relevant. I spent 32 minutes reviewing chart and  face to face time with patient.    Meds ordered this encounter  Medications  . pantoprazole (PROTONIX) 40 MG tablet    Sig: Take 1 tablet (40 mg total) by mouth daily.    Dispense:  30 tablet    Refill:  3    Order Specific Question:   Supervising Provider    Answer:   Joya Gaskins, PATRICK E [[id number]]  . hydrOXYzine (ATARAX/VISTARIL) 10 MG tablet    Sig: Take 1 tablet (10 mg total) by mouth 3 (three) times daily as needed.    Dispense:  60 tablet    Refill:  2    Order Specific Question:   Supervising Provider    Answer:   Joya Gaskins, PATRICK E [[id number]]  . ARIPiprazole (ABILIFY) 2 MG tablet    Sig: Take 1 tablet (2 mg total) by mouth daily.    Dispense:  30 tablet    Refill:  2    Order Specific Question:   Supervising Provider    Answer:   Joya Gaskins, PATRICK E [[id number]]  . PARoxetine (PAXIL) 10 MG tablet    Sig: Take 1 tab PO once daily  for one week then increase to 2 tabs PO once daily    Dispense:  60 tablet    Refill:  1    Order Specific Question:   Supervising Provider    Answer:   Elsie Stain [[id number]]    Follow-up: Return if symptoms worsen or fail to improve.    Loraine Grip Mayers, PA-C

## 2021-01-04 ENCOUNTER — Telehealth: Payer: Self-pay | Admitting: *Deleted

## 2021-01-04 DIAGNOSIS — E559 Vitamin D deficiency, unspecified: Secondary | ICD-10-CM | POA: Insufficient documentation

## 2021-01-04 DIAGNOSIS — R748 Abnormal levels of other serum enzymes: Secondary | ICD-10-CM | POA: Insufficient documentation

## 2021-01-04 LAB — HEPATIC FUNCTION PANEL
ALT: 15 IU/L (ref 0–32)
AST: 19 IU/L (ref 0–40)
Albumin: 4 g/dL (ref 3.8–4.8)
Alkaline Phosphatase: 63 IU/L (ref 44–121)
Bilirubin Total: 0.7 mg/dL (ref 0.0–1.2)
Bilirubin, Direct: 0.19 mg/dL (ref 0.00–0.40)
Total Protein: 7 g/dL (ref 6.0–8.5)

## 2021-01-04 LAB — VITAMIN D 25 HYDROXY (VIT D DEFICIENCY, FRACTURES): Vit D, 25-Hydroxy: 11.1 ng/mL — ABNORMAL LOW (ref 30.0–100.0)

## 2021-01-04 MED ORDER — VITAMIN D (ERGOCALCIFEROL) 1.25 MG (50000 UNIT) PO CAPS: 50000.0000 [IU] | ORAL_CAPSULE | ORAL | 2 refills | Status: DC

## 2021-01-04 NOTE — Telephone Encounter (Signed)
Patient verified DOB Patient is aware of vitamin d level being low and needing to restart the once a week capsule with a recheck in 3 months. Patient picked up medication today.

## 2021-01-04 NOTE — Telephone Encounter (Signed)
-----   Message from Kennieth Rad, [place] sent at 01/04/2021 10:18 AM EDT ----- Please call patient and let her know that her vitamin D is very low again, states that she needs to take 50,000 units once a week for at least the next 12 weeks.  She should have her levels rechecked at that time.  Prescription sent to her pharmacy.  Her liver study was within normal limits.  This was completed for her to present to her wrist.

## 2021-01-04 NOTE — Addendum Note (Signed)
Addended by: Kennieth Rad on: 01/04/2021 10:18 AM   Modules accepted: Orders

## 2021-01-10 NOTE — Telephone Encounter (Signed)
Please change sig to 1 a day due to insurance max dose being as shared.

## 2021-02-09 ENCOUNTER — Other Ambulatory Visit: Payer: Self-pay

## 2021-02-09 ENCOUNTER — Ambulatory Visit (INDEPENDENT_AMBULATORY_CARE_PROVIDER_SITE_OTHER): Payer: Medicaid Other | Admitting: Physician Assistant

## 2021-02-09 ENCOUNTER — Encounter (HOSPITAL_COMMUNITY): Payer: Self-pay | Admitting: Physician Assistant

## 2021-02-09 DIAGNOSIS — F319 Bipolar disorder, unspecified: Secondary | ICD-10-CM

## 2021-02-09 DIAGNOSIS — F411 Generalized anxiety disorder: Secondary | ICD-10-CM

## 2021-02-09 MED ORDER — CLONAZEPAM 0.5 MG PO TABS: 0.5000 mg | ORAL_TABLET | Freq: Two times a day (BID) | ORAL | 1 refills | Status: DC | PRN

## 2021-02-09 MED ORDER — ARIPIPRAZOLE 5 MG PO TABS: 5.0000 mg | ORAL_TABLET | Freq: Every day | ORAL | 1 refills | Status: DC

## 2021-02-09 MED ORDER — PAROXETINE HCL 20 MG PO TABS: 20.0000 mg | ORAL_TABLET | Freq: Every day | ORAL | 1 refills | Status: DC
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
## 2021-03-03 ENCOUNTER — Ambulatory Visit (INDEPENDENT_AMBULATORY_CARE_PROVIDER_SITE_OTHER): Payer: Medicaid Other | Admitting: Podiatry

## 2021-03-03 ENCOUNTER — Other Ambulatory Visit: Payer: Self-pay | Admitting: Podiatry

## 2021-03-03 ENCOUNTER — Other Ambulatory Visit: Payer: Self-pay

## 2021-03-03 ENCOUNTER — Ambulatory Visit (INDEPENDENT_AMBULATORY_CARE_PROVIDER_SITE_OTHER): Payer: Medicaid Other

## 2021-03-03 DIAGNOSIS — M7752 Other enthesopathy of left foot: Secondary | ICD-10-CM | POA: Diagnosis not present

## 2021-03-03 DIAGNOSIS — M778 Other enthesopathies, not elsewhere classified: Secondary | ICD-10-CM | POA: Diagnosis not present

## 2021-03-03 DIAGNOSIS — M79671 Pain in right foot: Secondary | ICD-10-CM

## 2021-03-03 DIAGNOSIS — D492 Neoplasm of unspecified behavior of bone, soft tissue, and skin: Secondary | ICD-10-CM | POA: Diagnosis not present

## 2021-03-03 DIAGNOSIS — B351 Tinea unguium: Secondary | ICD-10-CM

## 2021-03-03 DIAGNOSIS — Q828 Other specified congenital malformations of skin: Secondary | ICD-10-CM

## 2021-03-03 MED ORDER — TERBINAFINE HCL 250 MG PO TABS: 250.0000 mg | ORAL_TABLET | Freq: Every day | ORAL | 0 refills | Status: AC

## 2021-03-07 ENCOUNTER — Encounter: Payer: Self-pay | Admitting: Podiatry

## 2021-03-07 NOTE — Progress Notes (Signed)
Subjective:  Patient ID: Alicia [name], female    DOB: 1980-09-16,  MRN: [id number]  Chief Complaint  Patient presents with   Callouses    Right foot pain on the side of foot     40 y.o. female presents with the above complaint.  Patient presents with complaint of right submetatarsal 5 hyperkeratotic lesion/skin lesion/porokeratosis.  She states that it came back again and is very painful to touch.  She would like to have them debrided down and discuss steroid injection which does seem to help.   Review of Systems: Negative except as noted in the HPI. Denies N/V/F/Ch.  Past Medical History:  Diagnosis Date   Anxiety    Arthritis    Phreesia 03/08/2020   Bladder infection    Complication of anesthesia    Epidural did not work w/2nd preg. labor   Depression    No med. currently, Stopped with + UPT   Depression 06/27/2012   Gonorrhea    Hemorrhoids 06/27/2012   Osteoarthritis 06/27/2012   Osteoporosis    Phreesia 03/08/2020   Panic disorder 06/27/2012   Sleep apnea    Phreesia 03/08/2020    Current Outpatient Medications:    terbinafine (LAMISIL) 250 MG tablet, Take 1 tablet (250 mg total) by mouth daily., Disp: 30 tablet, Rfl: 0   albuterol (VENTOLIN HFA) 108 (90 Base) MCG/ACT inhaler, Inhale 1-2 puffs into the lungs every 6 (six) hours as needed for wheezing or shortness of breath., Disp: 18 g, Rfl: 2   ARIPiprazole (ABILIFY) 5 MG tablet, Take 1 tablet (5 mg total) by mouth daily., Disp: 30 tablet, Rfl: 1   clonazePAM (KLONOPIN) 0.5 MG tablet, Take 1 tablet (0.5 mg total) by mouth 2 (two) times daily as needed for anxiety., Disp: 60 tablet, Rfl: 1   cyclobenzaprine (FLEXERIL) 5 MG tablet, Take 1 tablet (5 mg total) by mouth 2 (two) times daily as needed for muscle spasms., Disp: 30 tablet, Rfl: 0   cyclobenzaprine (FLEXERIL) 5 MG tablet, Take 1-2 tablets (5-10 mg total) by mouth at bedtime., Disp: 60 tablet, Rfl: 3   Drospirenone (SLYND) 4 MG TABS, Take 1 tablet by mouth  daily., Disp: 84 tablet, Rfl: 4   hydrOXYzine (ATARAX/VISTARIL) 10 MG tablet, Take 1 tablet (10 mg total) by mouth 3 (three) times daily as needed., Disp: 60 tablet, Rfl: 2   pantoprazole (PROTONIX) 40 MG tablet, Take 1 tablet (40 mg total) by mouth daily., Disp: 30 tablet, Rfl: 3   PARoxetine (PAXIL) 20 MG tablet, Take 1 tablet (20 mg total) by mouth daily., Disp: 30 tablet, Rfl: 1   Vitamin D, Ergocalciferol, (DRISDOL) 1.25 MG (50000 UNIT) CAPS capsule, Take 1 capsule (50,000 Units total) by mouth every 7 (seven) days., Disp: 4 capsule, Rfl: 2  Social History   Tobacco Use  Smoking Status Every Day   Packs/day: 0.50   Pack years: 0.00   Types: Cigarettes  Smokeless Tobacco Never    Allergies  Allergen Reactions   Penicillins    Objective:  There were no vitals filed for this visit. There is no height or weight on file to calculate BMI. Constitutional Well developed. Well nourished.  Vascular Dorsalis pedis pulses palpable bilaterally. Posterior tibial pulses palpable bilaterally. Capillary refill normal to all digits.  No cyanosis or clubbing noted. Pedal hair growth normal.  Neurologic Normal speech. Oriented to person, place, and time. Epicritic sensation to light touch grossly present bilaterally.  Dermatologic  hyperkeratotic lesion with central nucleated core noted to the right  lateral foot.  No pain at the peroneal tendon no pain at the fifth metatarsophalangeal joint.  Pain on palpation to this lesion.  No pinpoint bleeding noted upon debridement.  Orthopedic: Normal joint ROM without pain or crepitus bilaterally. No visible deformities. No bony tenderness.   Radiographs: Three views of skeletally mature adult right foot: X-ray is within normal limits.  No stress fracture noted no actual fracture noted.  No other bony abnormalities identified Assessment:   1. Capsulitis of right foot   2. Skin neoplasm    Plan:  Patient was evaluated and treated and all questions  answered.  Right lateral foot submetatarsal five porokeratosis with underlying capsulitis -I explained to the patient the etiology of porokeratosis emergency room and options were discussed.  Given the amount of pain that she is having I believe patient will benefit from a steroid injection to help decrease acute inflammatory component associated with pain.  This will be followed by aggressive debridement of the lesion.  I discussed this with the patient in extensive detail.  Patient states understanding would like to proceed with a steroid injection -A steroid injection was performed at right lateral plantar foot a point of maximal tenderness using 1% plain Lidocaine and 10 mg of Kenalog. This was well tolerated. -She had tried catheter therapy which seemed to have made it go away however it has recurred.  If this continues to happen we will discuss surgical options at this time.  Patient states understanding   No follow-ups on file.

## 2021-03-13 ENCOUNTER — Ambulatory Visit: Payer: Self-pay

## 2021-03-17 ENCOUNTER — Encounter (HOSPITAL_COMMUNITY): Payer: Self-pay | Admitting: Licensed Clinical Social Worker

## 2021-03-17 ENCOUNTER — Other Ambulatory Visit: Payer: Self-pay

## 2021-03-17 ENCOUNTER — Ambulatory Visit (INDEPENDENT_AMBULATORY_CARE_PROVIDER_SITE_OTHER): Payer: Medicaid Other | Admitting: Licensed Clinical Social Worker

## 2021-03-17 DIAGNOSIS — F319 Bipolar disorder, unspecified: Secondary | ICD-10-CM | POA: Diagnosis not present
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
## 2021-03-22 ENCOUNTER — Ambulatory Visit (HOSPITAL_COMMUNITY): Payer: Self-pay | Admitting: Physician Assistant

## 2021-03-23 ENCOUNTER — Other Ambulatory Visit: Payer: Self-pay

## 2021-03-23 ENCOUNTER — Telehealth (INDEPENDENT_AMBULATORY_CARE_PROVIDER_SITE_OTHER): Payer: Medicaid Other | Admitting: Physician Assistant

## 2021-03-23 DIAGNOSIS — F319 Bipolar disorder, unspecified: Secondary | ICD-10-CM

## 2021-03-23 DIAGNOSIS — F411 Generalized anxiety disorder: Secondary | ICD-10-CM | POA: Diagnosis not present

## 2021-03-24 ENCOUNTER — Encounter (HOSPITAL_COMMUNITY): Payer: Self-pay | Admitting: Physician Assistant

## 2021-03-24 MED ORDER — ARIPIPRAZOLE 5 MG PO TABS: 5.0000 mg | ORAL_TABLET | Freq: Every day | ORAL | 1 refills | Status: DC

## 2021-03-24 MED ORDER — PAROXETINE HCL 20 MG PO TABS: 20.0000 mg | ORAL_TABLET | Freq: Every day | ORAL | 1 refills | Status: DC

## 2021-03-24 MED ORDER — CLONAZEPAM 0.5 MG PO TABS: 0.5000 mg | ORAL_TABLET | Freq: Two times a day (BID) | ORAL | 1 refills | Status: DC | PRN

## 2021-03-24 NOTE — Progress Notes (Signed)
[place] MD/PA/NP OP Progress Note  Virtual Visit via Video Note  I connected with Alicia [name] on 03/23/21 at  2:30 PM EDT by a video enabled telemedicine application and verified that I am speaking with the correct person using two identifiers.  Location: Patient: Home Provider: Working remotely   I discussed the limitations of evaluation and management by telemedicine and the availability of in person appointments. The patient expressed understanding and agreed to proceed.  Follow Up Instructions:  I discussed the assessment and treatment plan with the patient. The patient was provided an opportunity to ask questions and all were answered. The patient agreed with the plan and demonstrated an understanding of the instructions.   The patient was advised to call back or seek an in-person evaluation if the symptoms worsen or if the condition fails to improve as anticipated.  I provided 20 minutes of non-face-to-face time during this encounter.  Alicia Mood, PA   03/23/2021 4:37 PM Alicia [name]  MRN:  [id number]  Chief Complaint: Follow up and medication management  HPI:   Alicia [name] is a 40 year old female with a past psychiatric history significant for bipolar disorder and generalized anxiety disorder who presents to [facility] via virtual video visit for follow-up and medication management.  Patient is currently being managed on the following medications:  Paroxetine 20 mg daily Clonazepam (Klonopin) 0.5 mg 2 times daily as needed Aripiprazole 5 mg daily  Patient reports no issues or concerns regarding her current medication regimen.  Patient denies the need for dosage adjustments at this time and is requesting refills following the conclusion of the encounter.  Patient denies any new stressors or major life changing events.  She reports that she recently acquired a new job and will be starting on Tuesday.  Patient denies any other  issues regarding her mental health.  A GAD-7 screen was performed with the patient scoring a 15.  Patient is alert and oriented x4, pleasant, calm, cooperative, and fully engaged in conversation during the encounter.  Patient endorses good [name], however, she does report some mild pain and drowsiness.  Patient denies suicidal or homicidal ideations.  She further denies auditory or visual hallucinations and does not appear to be responding to internal/external stimuli.  Patient endorses good sleep and receives on average 10 hours of sleep each night.  She does express that a couple of days ago she experienced some sleep disturbances characterized by tossing and turning.  Patient endorses decreased appetite and eats on average 1 meal as well as some snacking each day.  Patient endorses alcohol consumption every now and then.  She endorses tobacco use and smokes on average 3 cigarettes/day.  Patient denies illicit drug use.  Visit Diagnosis:    ICD-10-CM   1. Generalized anxiety disorder  F41.1 PARoxetine (PAXIL) 20 MG tablet    clonazePAM (KLONOPIN) 0.5 MG tablet    2. Bipolar depression ([place])  F31.9 PARoxetine (PAXIL) 20 MG tablet    ARIPiprazole (ABILIFY) 5 MG tablet      Past Psychiatric History:  Generalized anxiety disorder Bipolar disorder  Past Medical History:  Past Medical History:  Diagnosis Date   Anxiety    Arthritis    Phreesia 03/08/2020   Bladder infection    Complication of anesthesia    Epidural did not work w/2nd preg. labor   Depression    No med. currently, Stopped with + UPT   Depression 06/27/2012   Gonorrhea    Hemorrhoids 06/27/2012  Osteoarthritis 06/27/2012   Osteoporosis    Phreesia 03/08/2020   Panic disorder 06/27/2012   Sleep apnea    Phreesia 03/08/2020    Past Surgical History:  Procedure Laterality Date   BREAST MASS EXCISION     Benign   BREAST SURGERY N/A    Phreesia 03/08/2020   CESAREAN SECTION     X 1 1st preg., then VBAC   CESAREAN  SECTION N/A 01/27/2013   Procedure: CESAREAN SECTION;  Surgeon: Shelly Bombard, MD;  Location: [facility];  Service: Obstetrics;  Laterality: N/A;   MOUTH SURGERY      Family Psychiatric History:  Mother - Major depressive disorder  Family History:  Family History  Problem Relation Age of Onset   Cancer Mother    Other Neg Hx    Alcohol abuse Neg Hx    Arthritis Neg Hx    Asthma Neg Hx    Birth defects Neg Hx    COPD Neg Hx    Depression Neg Hx    Drug abuse Neg Hx    Diabetes Neg Hx    Early death Neg Hx    Hearing loss Neg Hx    Heart disease Neg Hx    Hyperlipidemia Neg Hx    Hypertension Neg Hx    Kidney disease Neg Hx    Learning disabilities Neg Hx    Mental illness Neg Hx    Mental retardation Neg Hx    Miscarriages / Stillbirths Neg Hx    Stroke Neg Hx    Vision loss Neg Hx     Social History:  Social History   Socioeconomic History   Marital status: Widowed    Spouse name: Not on file   Number of children: Not on file   Years of education: Not on file   Highest education level: Not on file  Occupational History   Not on file  Tobacco Use   Smoking status: Every Day    Packs/day: 0.25    Types: Cigarettes   Smokeless tobacco: Never  Vaping Use   Vaping Use: Never used  Substance and Sexual Activity   Alcohol use: Yes    Comment: occ   Drug use: No   Sexual activity: Yes    Birth control/protection: None    Comment: Last intercourse at least 1 week ago  Other Topics Concern   Not on file  Social History Narrative   Not on file   Social Determinants of Health   Financial Resource Strain: Low Risk    Difficulty of Paying Living Expenses: Not hard at all  Food Insecurity: No Food Insecurity   Worried About Charity fundraiser in the Last Year: Never true   Ran Out of Food in the Last Year: Never true  Transportation Needs: No Transportation Needs   Lack of Transportation (Medical): No   Lack of Transportation (Non-Medical): No  Physical  Activity: Inactive   Days of Exercise per Week: 0 days   Minutes of Exercise per Session: 0 min  Stress: Stress Concern Present   Feeling of Stress : Rather much  Social Connections: Moderately Isolated   Frequency of Communication with Friends and Family: More than three times a week   Frequency of Social Gatherings with Friends and Family: Twice a week   Attends Religious Services: More than 4 times per year   Active Member of [employer] or Organizations: No   Attends Archivist Meetings: Never   Marital Status: Widowed  Allergies:  Allergies  Allergen Reactions   Penicillins     Metabolic Disorder Labs: No results found for: HGBA1C, MPG No results found for: PROLACTIN Lab Results  Component Value Date   CHOL 188 01/26/2020   TRIG 58 01/26/2020   HDL 47 01/26/2020   CHOLHDL 4.0 01/26/2020   LDLCALC 130 (H) 01/26/2020   Lab Results  Component Value Date   TSH 1.350 01/26/2020    Therapeutic Level Labs: No results found for: LITHIUM No results found for: VALPROATE No components found for:  CBMZ  Current Medications: Current Outpatient Medications  Medication Sig Dispense Refill   albuterol (VENTOLIN HFA) 108 (90 Base) MCG/ACT inhaler Inhale 1-2 puffs into the lungs every 6 (six) hours as needed for wheezing or shortness of breath. 18 g 2   ARIPiprazole (ABILIFY) 5 MG tablet Take 1 tablet (5 mg total) by mouth daily. 30 tablet 1   clonazePAM (KLONOPIN) 0.5 MG tablet Take 1 tablet (0.5 mg total) by mouth 2 (two) times daily as needed for anxiety. 60 tablet 1   cyclobenzaprine (FLEXERIL) 5 MG tablet Take 1 tablet (5 mg total) by mouth 2 (two) times daily as needed for muscle spasms. 30 tablet 0   cyclobenzaprine (FLEXERIL) 5 MG tablet Take 1-2 tablets (5-10 mg total) by mouth at bedtime. 60 tablet 3   Drospirenone (SLYND) 4 MG TABS Take 1 tablet by mouth daily. 84 tablet 4   hydrOXYzine (ATARAX/VISTARIL) 10 MG tablet Take 1 tablet (10 mg total) by mouth 3 (three)  times daily as needed. 60 tablet 2   pantoprazole (PROTONIX) 40 MG tablet Take 1 tablet (40 mg total) by mouth daily. 30 tablet 3   PARoxetine (PAXIL) 20 MG tablet Take 1 tablet (20 mg total) by mouth daily. 30 tablet 1   terbinafine (LAMISIL) 250 MG tablet Take 1 tablet (250 mg total) by mouth daily. 30 tablet 0   Vitamin D, Ergocalciferol, (DRISDOL) 1.25 MG (50000 UNIT) CAPS capsule Take 1 capsule (50,000 Units total) by mouth every 7 (seven) days. 4 capsule 2   No current facility-administered medications for this visit.     Musculoskeletal: Strength & Muscle Tone: Unable to assess due to telemedicine visit [place]: Unable to assess due to telemedicine visit Patient leans: Unable to assess due to telemedicine visit  Psychiatric Specialty Exam: Review of Systems  Psychiatric/Behavioral:  Negative for decreased concentration, dysphoric [name], hallucinations, self-injury, sleep disturbance and suicidal ideas. The patient is not nervous/anxious and is not hyperactive.    There were no vitals taken for this visit.There is no height or weight on file to calculate BMI.  General Appearance: Fairly Groomed  Eye Contact:  Good  Speech:  Clear and Coherent and Normal Rate  Volume:  Normal  [name]:  Euthymic  Affect:  Appropriate  Thought Process:  Coherent and Descriptions of Associations: Intact  Orientation:  Full (Time, Place, and Person)  Thought Content: WDL   Suicidal Thoughts:  No  Homicidal Thoughts:  No  Memory:  Immediate;   Good Recent;   Good Remote;   Good  Judgement:  Good  Insight:  Good  Psychomotor Activity:  Normal  Concentration:  Concentration: Good and Attention Span: Good  Recall:  Good  Fund of Knowledge: Good  Language: Good  Akathisia:  NA  Handed:  Right  AIMS (if indicated): not done  Assets:  Communication Skills Desire for Improvement Housing Social Support  ADL's:  Intact  Cognition: WNL  Sleep:  Good  Screenings: GAD-7    Flowsheet Row  Video Visit from 03/23/2021 in [facility] Office Visit from 02/09/2021 in [facility] Office Visit from 01/03/2021 in [place] [facility] Telemedicine from 04/26/2020 in Primary Care at [facility]  Total GAD-7 Score '15 19 14 21      '$ PHQ2-9    Flowsheet Row Video Visit from 03/23/2021 in [facility] Counselor from 03/17/2021 in [facility] Office Visit from 02/09/2021 in [facility] Office Visit from 01/03/2021 in [place] [facility]  PHQ-2 Total Score 0 0 1 2  PHQ-9 Total Score -- -- -- 7      Flowsheet Row Video Visit from 03/23/2021 in [facility] Counselor from 03/17/2021 in [facility] Office Visit from 02/09/2021 in [place] No Risk No Risk No Risk        Assessment and Plan:   Myangel Passini is a 40 year old female with a past psychiatric history significant for bipolar disorder and generalized anxiety disorder who presents to [facility] via virtual video visit for follow-up and medication management.  Patient continues to do well on her medications and denies the need for dosage adjustments at this time.  Patient is requesting refills on all her medications following the conclusion of the encounter.  Patient's medications to be e-prescribed to pharmacy of choice.  1. Generalized anxiety disorder  - PARoxetine (PAXIL) 20 MG tablet; Take 1 tablet (20 mg total) by mouth daily.  Dispense: 30 tablet; Refill: 1 - clonazePAM (KLONOPIN) 0.5 MG tablet; Take 1 tablet (0.5 mg total) by mouth 2 (two) times daily as needed for anxiety.  Dispense: 60 tablet; Refill: 1  2. Bipolar depression ([place])  - PARoxetine (PAXIL) 20 MG tablet; Take 1 tablet (20 mg total) by mouth daily.  Dispense: 30 tablet; Refill:  1 - ARIPiprazole (ABILIFY) 5 MG tablet; Take 1 tablet (5 mg total) by mouth daily.  Dispense: 30 tablet; Refill: 1  Patient to follow up in 2 months Provider spent a total of 20 minutes with the patient/reviewing patient's chart  Alicia Mood, PA 03/23/2021, 4:37 PM

## 2021-03-27 ENCOUNTER — Other Ambulatory Visit: Payer: Self-pay | Admitting: Physician Assistant

## 2021-03-27 DIAGNOSIS — E559 Vitamin D deficiency, unspecified: Secondary | ICD-10-CM

## 2021-03-31 ENCOUNTER — Ambulatory Visit: Payer: Medicaid Other | Admitting: Podiatry

## 2021-04-17 ENCOUNTER — Other Ambulatory Visit: Payer: Self-pay | Admitting: Family Medicine

## 2021-04-18 ENCOUNTER — Other Ambulatory Visit: Payer: Self-pay

## 2021-04-18 ENCOUNTER — Ambulatory Visit: Payer: Medicaid Other | Admitting: Physician Assistant

## 2021-04-18 VITALS — BP 127/72 | HR 96 | Temp 98.7°F | Resp 18 | Ht 62.0 in | Wt 227.0 lb

## 2021-04-18 DIAGNOSIS — M5442 Lumbago with sciatica, left side: Secondary | ICD-10-CM

## 2021-04-18 DIAGNOSIS — M5441 Lumbago with sciatica, right side: Secondary | ICD-10-CM

## 2021-04-18 DIAGNOSIS — G8929 Other chronic pain: Secondary | ICD-10-CM

## 2021-04-18 DIAGNOSIS — F411 Generalized anxiety disorder: Secondary | ICD-10-CM | POA: Diagnosis not present

## 2021-04-18 DIAGNOSIS — E559 Vitamin D deficiency, unspecified: Secondary | ICD-10-CM | POA: Diagnosis not present

## 2021-04-18 DIAGNOSIS — Z6841 Body Mass Index (BMI) 40.0 and over, adult: Secondary | ICD-10-CM

## 2021-04-18 MED ORDER — CYCLOBENZAPRINE HCL 5 MG PO TABS: 5.0000 mg | ORAL_TABLET | Freq: Every day | ORAL | 1 refills | Status: DC

## 2021-04-18 NOTE — Progress Notes (Signed)
Established Patient Office Visit  Subjective:  Patient ID: Alicia [name], female    DOB: [birth year]  Age: 40 y.o. MRN: [id number]  CC:  Chief Complaint  Patient presents with   Back Pain     HPI Alicia [name] requested refill of her muscle relaxer.  Reports that she will use it as needed to help with her chronic back pain.  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  Reports that she will use the muscle relaxer near bedtime with some relief.  Does state that she has been taking the vitamin D regimen once a week, does states she has a couple weeks left.  No other concerns at this time  Past Medical History:  Diagnosis Date   Anxiety    Arthritis    Phreesia 03/08/2020   Bladder infection    Complication of anesthesia    Epidural did not work w/2nd preg. labor   Depression    No med. currently, Stopped with + UPT   Depression 06/27/2012   Gonorrhea    Hemorrhoids 06/27/2012   Osteoarthritis 06/27/2012   Osteoporosis    Phreesia 03/08/2020   Panic disorder 06/27/2012   Sleep apnea    Phreesia 03/08/2020    Past Surgical History:  Procedure Laterality Date   BREAST MASS EXCISION     Benign   BREAST SURGERY N/A    Phreesia 03/08/2020   CESAREAN SECTION     X 1 1st preg., then VBAC   CESAREAN SECTION N/A 01/27/2013   Procedure: CESAREAN SECTION;  Surgeon: Shelly Bombard, MD;  Location: [facility];  Service: Obstetrics;  Laterality: N/A;   MOUTH SURGERY      Family History  Problem Relation Age of Onset   Cancer Mother    Other Neg Hx    Alcohol abuse Neg Hx    Arthritis Neg Hx    Asthma Neg Hx    Birth defects Neg Hx    COPD Neg Hx    Depression Neg Hx    Drug abuse Neg Hx    Diabetes Neg Hx    Early death Neg Hx    Hearing loss Neg Hx    Heart disease Neg Hx    Hyperlipidemia Neg Hx    Hypertension Neg Hx    Kidney disease Neg Hx    Learning disabilities Neg Hx    Mental illness Neg Hx    Mental  retardation Neg Hx    Miscarriages / Stillbirths Neg Hx    Stroke Neg Hx    Vision loss Neg Hx     Social History   Socioeconomic History   Marital status: Widowed    Spouse name: Not on file   Number of children: Not on file   Years of education: Not on file   Highest education level: Not on file  Occupational History   Not on file  Tobacco Use   Smoking status: Every Day    Packs/day: 0.25    Types: Cigarettes   Smokeless tobacco: Never  Vaping Use   Vaping Use: Never used  Substance and Sexual Activity   Alcohol use: Yes    Comment: occ   Drug use: No   Sexual activity: Yes    Birth control/protection: None    Comment: Last intercourse at least 1 week ago  Other Topics Concern   Not on file  Social History Narrative   Not on file  Social Determinants of Health   Financial Resource Strain: Low Risk    Difficulty of Paying Living Expenses: Not hard at all  Food Insecurity: No Food Insecurity   Worried About Charity fundraiser in the Last Year: Never true   [place] in the Last Year: Never true  Transportation Needs: No Transportation Needs   Lack of Transportation (Medical): No   Lack of Transportation (Non-Medical): No  Physical Activity: Inactive   Days of Exercise per Week: 0 days   Minutes of Exercise per Session: 0 min  Stress: Stress Concern Present   Feeling of Stress : Rather much  Social Connections: Moderately Isolated   Frequency of Communication with Friends and Family: More than three times a week   Frequency of Social Gatherings with Friends and Family: Twice a week   Attends Religious Services: More than 4 times per year   Active Member of [employer] or Organizations: No   Attends Archivist Meetings: Never   Marital Status: Widowed  Human resources officer Violence: Not At Risk   Fear of Current or Ex-Partner: No   Emotionally Abused: No   Physically Abused: No   Sexually Abused: No    Outpatient Medications Prior to Visit   Medication Sig Dispense Refill   albuterol (VENTOLIN HFA) 108 (90 Base) MCG/ACT inhaler Inhale 1-2 puffs into the lungs every 6 (six) hours as needed for wheezing or shortness of breath. 18 g 2   ARIPiprazole (ABILIFY) 5 MG tablet Take 1 tablet (5 mg total) by mouth daily. 30 tablet 1   clonazePAM (KLONOPIN) 0.5 MG tablet Take 1 tablet (0.5 mg total) by mouth 2 (two) times daily as needed for anxiety. 60 tablet 1   Drospirenone (SLYND) 4 MG TABS Take 1 tablet by mouth daily. 84 tablet 4   hydrOXYzine (ATARAX/VISTARIL) 10 MG tablet Take 1 tablet (10 mg total) by mouth 3 (three) times daily as needed. 60 tablet 2   pantoprazole (PROTONIX) 40 MG tablet Take 1 tablet (40 mg total) by mouth daily. 30 tablet 3   PARoxetine (PAXIL) 20 MG tablet Take 1 tablet (20 mg total) by mouth daily. 30 tablet 1   Vitamin D, Ergocalciferol, (DRISDOL) 1.25 MG (50000 UNIT) CAPS capsule TAKE 1 CAPSULE (50,000 UNITS TOTAL) BY MOUTH EVERY 7 (SEVEN) DAYS 4 capsule 2   cyclobenzaprine (FLEXERIL) 5 MG tablet Take 1 tablet (5 mg total) by mouth 2 (two) times daily as needed for muscle spasms. 30 tablet 0   cyclobenzaprine (FLEXERIL) 5 MG tablet Take 1-2 tablets (5-10 mg total) by mouth at bedtime. 60 tablet 3   No facility-administered medications prior to visit.    Allergies  Allergen Reactions   Penicillins     ROS Review of Systems  Constitutional: Negative.   HENT: Negative.    Eyes: Negative.   Respiratory:  Negative for shortness of breath.   Cardiovascular:  Negative for chest pain.  Gastrointestinal: Negative.   Endocrine: Negative.   Genitourinary: Negative.   Musculoskeletal:  Positive for back pain.  Skin: Negative.   Allergic/Immunologic: Negative.   Neurological: Negative.   Hematological: Negative.   Psychiatric/Behavioral: Negative.       Objective:    Physical Exam Vitals and nursing note reviewed.  Constitutional:      Appearance: Normal appearance. She is obese.  HENT:     Head:  Normocephalic and atraumatic.     Right Ear: External ear normal.     Left Ear: External ear normal.  Nose: Nose normal.     Mouth/Throat:     Mouth: Mucous membranes are moist.     Pharynx: Oropharynx is clear.  Eyes:     Extraocular Movements: Extraocular movements intact.     Conjunctiva/sclera: Conjunctivae normal.     Pupils: Pupils are equal, round, and reactive to light.  Cardiovascular:     Rate and Rhythm: Normal rate.     Pulses: Normal pulses.     Heart sounds: Normal heart sounds.  Pulmonary:     Effort: Pulmonary effort is normal.     Breath sounds: Normal breath sounds.  Musculoskeletal:        General: Normal range of motion.     Cervical back: Normal, normal range of motion and neck supple.     Thoracic back: Normal.     Lumbar back: Bony tenderness present. No swelling. Normal range of motion.  Skin:    General: Skin is warm and dry.  Neurological:     General: No focal deficit present.     Mental Status: She is alert and oriented to person, place, and time.  Psychiatric:        Mood and Affect: Mood normal.        Behavior: Behavior normal.        Thought Content: Thought content normal.        Judgment: Judgment normal.    BP 127/72 (BP Location: Left Arm, Patient Position: Sitting, Cuff Size: Large)   Pulse 96   Temp 98.7 F (37.1 C) (Oral)   Resp 18   Ht '5\' 2"'$  (1.575 m)   Wt 227 lb (103 kg)   SpO2 97%   BMI 41.52 kg/m  Wt Readings from Last 3 Encounters:  04/18/21 227 lb (103 kg)  01/03/21 217 lb (98.4 kg)  07/28/20 222 lb (100.7 kg)     Health Maintenance Due  Topic Date Due   COVID-19 Vaccine (1) Never done   Pneumococcal Vaccine 67-10 Years old (1 - PCV) Never done   INFLUENZA VACCINE  03/27/2021    There are no preventive care reminders to display for this patient.  Lab Results  Component Value Date   TSH 1.350 01/26/2020   Lab Results  Component Value Date   WBC 6.2 01/26/2020   HGB 12.9 01/26/2020   HCT 40.6 01/26/2020    MCV 82 01/26/2020   PLT 286 01/26/2020   Lab Results  Component Value Date   NA 139 01/26/2020   K 3.8 01/26/2020   CO2 24 09/16/2019   GLUCOSE 101 (H) 01/26/2020   BUN 7 01/26/2020   CREATININE 0.67 01/26/2020   BILITOT 0.7 01/03/2021   ALKPHOS 63 01/03/2021   AST 19 01/03/2021   ALT 15 01/03/2021   PROT 7.0 01/03/2021   ALBUMIN 4.0 01/03/2021   CALCIUM 9.5 01/26/2020   ANIONGAP 11 09/16/2019   Lab Results  Component Value Date   CHOL 188 01/26/2020   Lab Results  Component Value Date   HDL 47 01/26/2020   Lab Results  Component Value Date   LDLCALC 130 (H) 01/26/2020   Lab Results  Component Value Date   TRIG 58 01/26/2020   Lab Results  Component Value Date   CHOLHDL 4.0 01/26/2020   No results found for: HGBA1C    Assessment & Plan:   Problem List Items Addressed This Visit       Nervous and Auditory   Chronic midline low back pain with bilateral sciatica - Primary  Relevant Medications   cyclobenzaprine (FLEXERIL) 5 MG tablet     Other   Generalized anxiety disorder   Vitamin D deficiency   Class 3 severe obesity due to excess calories without serious comorbidity with body mass index (BMI) of 40.0 to 44.9 in adult [facility])    Meds ordered this encounter  Medications   cyclobenzaprine (FLEXERIL) 5 MG tablet    Sig: Take 1-2 tablets (5-10 mg total) by mouth at bedtime.    Dispense:  30 tablet    Refill:  1    Order Specific Question:   Supervising Provider    Answer:   WRIGHT, PATRICK E [[id number]]  1. Chronic midline low back pain with bilateral sciatica Patient education given on supportive care, gentle stretching.  Patient was given an appointment to establish with Dr. Redmond Pulling at [facility] at [facility].  Patient does use Klonopin as needed for anxiety, did encourage patient to be mindful of not using Flexeril if she is using Klonopin.  Patient understands and agrees. - cyclobenzaprine (FLEXERIL) 5 MG tablet; Take 1-2 tablets (5-10 mg total) by  mouth at bedtime.  Dispense: 30 tablet; Refill: 1  2. Vitamin D deficiency Continue current regimen  3. Generalized anxiety disorder Continue follow-up with behavioral health  4. Class 3 severe obesity due to excess calories without serious comorbidity with body mass index (BMI) of 40.0 to 44.9 in adult [facility])    I have reviewed the patient's medical history (PMH, PSH, Social History, Family History, Medications, and allergies) , and have been updated if relevant. I spent 31 minutes reviewing chart and  face to face time with patient.     Follow-up: Return in about 1 month (around 05/22/2021) for with Dr. Redmond Pulling at [place].    Loraine Grip Mayers, PA-C

## 2021-04-18 NOTE — Patient Instructions (Signed)
https://[email or web address]">  Chronic Back Pain When back pain lasts longer than 3 months, it is called chronic back pain. The cause of your back pain may not be known. Some common causes include: Wear and tear (degenerative disease) of the bones, ligaments, or disks in your back. Inflammation and stiffness in your back (arthritis). People who have chronic back pain often go through certain periods in which the pain is more intense (flare-ups). Many people can learn to manage the pain with home care. Follow these instructions at home: Pay attention to any changes in your symptoms. Take these actions to help withyour pain: Managing pain and stiffness     If directed, apply ice to the painful area. Your health care provider may recommend applying ice during the first 24-48 hours after a flare-up begins. To do this: Put ice in a plastic bag. Place a towel between your skin and the bag. Leave the ice on for 20 minutes, 2-3 times per day. If directed, apply heat to the affected area as often as told by your health care provider. Use the heat source that your health care provider recommends, such as a moist heat pack or a heating pad. Place a towel between your skin and the heat source. Leave the heat on for 20-30 minutes. Remove the heat if your skin turns bright red. This is especially important if you are unable to feel pain, heat, or cold. You may have a greater risk of getting burned. Try soaking in a warm tub. Activity  Avoid bending and other activities that make the problem worse. Maintain a proper position when standing or sitting: When standing, keep your upper back and neck straight, with your shoulders pulled back. Avoid slouching. When sitting, keep your back straight and relax your shoulders. Do not round your shoulders or pull them backward. Do not sit or stand in one place for long periods of time. Take brief periods of rest throughout the day. This will reduce your  pain. Resting in a lying or standing position is usually better than sitting to rest. When you are resting for longer periods, mix in some mild activity or stretching between periods of rest. This will help to prevent stiffness and pain. Get regular exercise. Ask your health care provider what activities are safe for you. Do not lift anything that is heavier than 10 lb (4.5 kg), or the limit that you are told, until your health care provider says that it is safe. Always use proper lifting technique, which includes: Bending your knees. Keeping the load close to your body. Avoiding twisting. Sleep on a firm mattress in a comfortable position. Try lying on your side with your knees slightly bent. If you lie on your back, put a pillow under your knees.  Medicines Treatment may include medicines for pain and inflammation taken by mouth or applied to the skin, prescription pain medicine, or muscle relaxants. Take over-the-counter and prescription medicines only as told by your health care provider. Ask your health care provider if the medicine prescribed to you: Requires you to avoid driving or using machinery. Can cause constipation. You may need to take these actions to prevent or treat constipation: Drink enough fluid to keep your urine pale yellow. Take over-the-counter or prescription medicines. Eat foods that are high in fiber, such as beans, whole grains, and fresh fruits and vegetables. Limit foods that are high in fat and processed sugars, such as fried or sweet foods. General instructions Do not use any products that contain   nicotine or tobacco, such as cigarettes, e-cigarettes, and chewing tobacco. If you need help quitting, ask your health care provider. Keep all follow-up visits as told by your health care provider. This is important. Contact a health care provider if: You have pain that is not relieved with rest or medicine. Your pain gets worse, or you have new pain. You have a high  fever. You have rapid weight loss. You have trouble doing your normal activities. Get help right away if: You have weakness or numbness in one or both of your legs or feet. You have trouble controlling your bladder or your bowels. You have severe back pain and have any of the following: Nausea or vomiting. Pain in your abdomen. Shortness of breath or you faint. Summary Chronic back pain is back pain that lasts longer than 3 months. When a flare-up begins, apply ice to the painful area for the first 24-48 hours. Apply a moist heat pad or use a heating pad on the painful area as directed by your health care provider. When you are resting for longer periods, mix in some mild activity or stretching between periods of rest. This will help to prevent stiffness and pain. This information is not intended to replace advice given to you by your health care provider. Make sure you discuss any questions you have with your healthcare provider. Document Revised: 09/23/2019 Document Reviewed: 09/23/2019 Elsevier Patient Education  2022 Elsevier Inc.  

## 2021-04-18 NOTE — Progress Notes (Signed)
Patient presents for refill on muscle relaxer and refill on inhaler. Patient has taken medication today. Patient has eaten today.

## 2021-04-19 DIAGNOSIS — Z6841 Body Mass Index (BMI) 40.0 and over, adult: Secondary | ICD-10-CM | POA: Insufficient documentation

## 2021-04-19 DIAGNOSIS — G8929 Other chronic pain: Secondary | ICD-10-CM | POA: Insufficient documentation

## 2021-05-22 ENCOUNTER — Encounter: Payer: Self-pay | Admitting: Family Medicine

## 2021-05-22 ENCOUNTER — Ambulatory Visit (INDEPENDENT_AMBULATORY_CARE_PROVIDER_SITE_OTHER): Payer: Self-pay | Admitting: Family Medicine

## 2021-05-22 ENCOUNTER — Other Ambulatory Visit: Payer: Self-pay

## 2021-05-22 VITALS — BP 94/59 | HR 76 | Temp 98.3°F | Resp 16 | Ht 60.0 in | Wt 220.0 lb

## 2021-05-22 DIAGNOSIS — I959 Hypotension, unspecified: Secondary | ICD-10-CM

## 2021-05-22 DIAGNOSIS — R55 Syncope and collapse: Secondary | ICD-10-CM

## 2021-05-22 DIAGNOSIS — F411 Generalized anxiety disorder: Secondary | ICD-10-CM

## 2021-05-22 NOTE — Progress Notes (Signed)
Patient is here to establish care Patient concern about syncope spills. Patient said the feeling just hits her at anytime during the day

## 2021-05-22 NOTE — Progress Notes (Signed)
Established Patient Office Visit  Subjective:  Patient ID: Alicia [name], female    DOB: 12/01/1980  Age: 40 y.o. MRN: [id number]  CC:  Chief Complaint  Patient presents with   Establish Care   Loss of Consciousness    HPI [name] presents for low blood pressure.  This has been noted on several visits over the past several months.  Along with this, patient also reports that she has started to have near syncopal episodes.  She does not relate these to changes of position.  She reports that these are becoming more frequent.  She is on no medications for her blood pressure.  She denies chest pain or shortness of breath.  Past Medical History:  Diagnosis Date   Anxiety    Arthritis    Phreesia 03/08/2020   Bladder infection    Complication of anesthesia    Epidural did not work w/2nd preg. labor   Depression    No med. currently, Stopped with + UPT   Depression 06/27/2012   Gonorrhea    Hemorrhoids 06/27/2012   Osteoarthritis 06/27/2012   Osteoporosis    Phreesia 03/08/2020   Panic disorder 06/27/2012   Sleep apnea    Phreesia 03/08/2020    Past Surgical History:  Procedure Laterality Date   BREAST MASS EXCISION     Benign   BREAST SURGERY N/A    Phreesia 03/08/2020   CESAREAN SECTION     X 1 1st preg., then VBAC   CESAREAN SECTION N/A 01/27/2013   Procedure: CESAREAN SECTION;  Surgeon: Shelly Bombard, MD;  Location: [facility];  Service: Obstetrics;  Laterality: N/A;   MOUTH SURGERY      Family History  Problem Relation Age of Onset   Cancer Mother    Other Neg Hx    Alcohol abuse Neg Hx    Arthritis Neg Hx    Asthma Neg Hx    Birth defects Neg Hx    COPD Neg Hx    Depression Neg Hx    Drug abuse Neg Hx    Diabetes Neg Hx    Early death Neg Hx    Hearing loss Neg Hx    Heart disease Neg Hx    Hyperlipidemia Neg Hx    Hypertension Neg Hx    Kidney disease Neg Hx    Learning disabilities Neg Hx    Mental illness Neg Hx    Mental retardation Neg Hx     Miscarriages / Stillbirths Neg Hx    Stroke Neg Hx    Vision loss Neg Hx     Social History   Socioeconomic History   Marital status: Widowed    Spouse name: Not on file   Number of children: Not on file   Years of education: Not on file   Highest education level: Not on file  Occupational History   Not on file  Tobacco Use   Smoking status: Every Day    Packs/day: 0.25    Types: Cigarettes   Smokeless tobacco: Never  Vaping Use   Vaping Use: Never used  Substance and Sexual Activity   Alcohol use: Yes    Comment: occ   Drug use: No   Sexual activity: Yes    Birth control/protection: None    Comment: Last intercourse at least 1 week ago  Other Topics Concern   Not on file  Social History Narrative   Not on file   Social Determinants of Radio broadcast assistant  Strain: Low Risk    Difficulty of Paying Living Expenses: Not hard at all  Food Insecurity: No Food Insecurity   Worried About Charity fundraiser in the Last Year: Never true   Ran Out of Food in the Last Year: Never true  Transportation Needs: No Transportation Needs   Lack of Transportation (Medical): No   Lack of Transportation (Non-Medical): No  Physical Activity: Inactive   Days of Exercise per Week: 0 days   Minutes of Exercise per Session: 0 min  Stress: Stress Concern Present   Feeling of Stress : Rather much  Social Connections: Moderately Isolated   Frequency of Communication with Friends and Family: More than three times a week   Frequency of Social Gatherings with Friends and Family: Twice a week   Attends Religious Services: More than 4 times per year   Active Member of [employer] or Organizations: No   Attends Archivist Meetings: Never   Marital Status: Widowed  Human resources officer Violence: Not At Risk   Fear of Current or Ex-Partner: No   Emotionally Abused: No   Physically Abused: No   Sexually Abused: No    ROS Review of Systems  Neurological:  Positive for syncope.  Negative for seizures and headaches.  All other systems reviewed and are negative.  Objective:   Today's Vitals: BP (!) 94/59 (BP Location: Right Arm, Patient Position: Sitting, Cuff Size: Large)   Pulse 76   Temp 98.3 F (36.8 C) (Oral)   Resp 16   Ht 5' (1.524 m)   Wt 220 lb (99.8 kg)   SpO2 99%   BMI 42.97 kg/m   Physical Exam Vitals and nursing note reviewed.  Constitutional:      General: She is not in acute distress. HENT:     Head: Normocephalic and atraumatic.  Cardiovascular:     Rate and Rhythm: Normal rate and regular rhythm.  Pulmonary:     Effort: Pulmonary effort is normal.     Breath sounds: Normal breath sounds.  Abdominal:     Palpations: Abdomen is soft.     Tenderness: There is no abdominal tenderness.  Neurological:     General: No focal deficit present.     Mental Status: She is alert and oriented to person, place, and time.    Assessment & Plan:   1. Hypotension, unspecified hypotension type Referral to cardiology for further evaluation and management. - Ambulatory referral to Cardiology  2. Near syncope As above. - Ambulatory referral to Cardiology  3. Generalized anxiety disorder Continue present management.   Outpatient Encounter Medications as of 05/22/2021  Medication Sig   albuterol (VENTOLIN HFA) 108 (90 Base) MCG/ACT inhaler Inhale 1-2 puffs into the lungs every 6 (six) hours as needed for wheezing or shortness of breath.   ARIPiprazole (ABILIFY) 5 MG tablet Take 1 tablet (5 mg total) by mouth daily.   clonazePAM (KLONOPIN) 0.5 MG tablet Take 1 tablet (0.5 mg total) by mouth 2 (two) times daily as needed for anxiety.   cyclobenzaprine (FLEXERIL) 5 MG tablet Take 1-2 tablets (5-10 mg total) by mouth at bedtime.   Drospirenone (SLYND) 4 MG TABS Take 1 tablet by mouth daily.   hydrOXYzine (ATARAX/VISTARIL) 10 MG tablet Take 1 tablet (10 mg total) by mouth 3 (three) times daily as needed.   pantoprazole (PROTONIX) 40 MG tablet Take 1  tablet (40 mg total) by mouth daily.   PARoxetine (PAXIL) 20 MG tablet Take 1 tablet (20 mg total) by  mouth daily.   Vitamin D, Ergocalciferol, (DRISDOL) 1.25 MG (50000 UNIT) CAPS capsule TAKE 1 CAPSULE (50,000 UNITS TOTAL) BY MOUTH EVERY 7 (SEVEN) DAYS   No facility-administered encounter medications on file as of 05/22/2021.    Follow-up: Return if symptoms worsen or fail to improve.   Becky Sax, MD

## 2021-05-25 ENCOUNTER — Other Ambulatory Visit: Payer: Self-pay

## 2021-05-25 ENCOUNTER — Encounter (HOSPITAL_COMMUNITY): Payer: Self-pay | Admitting: Physician Assistant

## 2021-05-25 ENCOUNTER — Telehealth (INDEPENDENT_AMBULATORY_CARE_PROVIDER_SITE_OTHER): Payer: 59 | Admitting: Physician Assistant

## 2021-05-25 DIAGNOSIS — F319 Bipolar disorder, unspecified: Secondary | ICD-10-CM

## 2021-05-25 DIAGNOSIS — F411 Generalized anxiety disorder: Secondary | ICD-10-CM

## 2021-05-25 MED ORDER — CLONAZEPAM 0.5 MG PO TABS: 0.5000 mg | ORAL_TABLET | Freq: Two times a day (BID) | ORAL | 1 refills | Status: DC | PRN

## 2021-05-25 MED ORDER — ARIPIPRAZOLE 5 MG PO TABS: 5.0000 mg | ORAL_TABLET | Freq: Every day | ORAL | 1 refills | Status: DC

## 2021-05-25 MED ORDER — PAROXETINE HCL 40 MG PO TABS: 40.0000 mg | ORAL_TABLET | Freq: Every day | ORAL | 1 refills | Status: DC

## 2021-05-25 NOTE — Progress Notes (Signed)
BH MD/PA/NP OP Progress Note  Virtual Visit via Video Note  I connected with Alicia [name] on 05/25/21 at  2:30 PM EDT by a video enabled telemedicine application and verified that I am speaking with the correct person using two identifiers.  Location: Patient: Home Provider: Clinic   I discussed the limitations of evaluation and management by telemedicine and the availability of in person appointments. The patient expressed understanding and agreed to proceed.  Follow Up Instructions:   I discussed the assessment and treatment plan with the patient. The patient was provided an opportunity to ask questions and all were answered. The patient agreed with the plan and demonstrated an understanding of the instructions.   The patient was advised to call back or seek an in-person evaluation if the symptoms worsen or if the condition fails to improve as anticipated.  I provided 15 minutes of non-face-to-face time during this encounter.  Alicia Mood, PA   05/25/2021 3:54 PM Alicia [name]  MRN:  [id number]  Chief Complaint: Follow up and medication management  HPI:   Alicia [name] is a 40 year old female with a past psychiatric history significant for bipolar disorder and generalized anxiety disorder who presents to [facility] via virtual telephone visit for follow-up and medication management.  Patient is currently being managed on the following medications:  Paroxetine 20 mg daily Clonazepam (Klonopin) 0.5 mg 2 times daily as needed Aripiprazole 5 mg daily  Patient reports that she has been experiencing really bad anxiety while at work.  Patient attributes her anxiety to having multiple things on her mind such as the holiday and the upcoming birthday of her deceased husband.  Patient expressed that she had to inform her manager that she needed fresh air due to her elevating anxiety.  Patient reports that her medications are still helpful in the  management of her symptoms but states that she has been under a lot of stress while on the job.  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  Patient states that she continues to take her medications as prescribed.  Patient endorses depressive episodes but thinks it may be due to the change in weather and upcoming holidays.  Patient rates her anxiety an 8 out of 10.  Patient states that her anxiety may be due to certain triggers such as being around a lot of people in a crowded store or may randomly come out of the blue.  Patient notes that she needs to start taking more time to herself so as not to experience worsening of her symptoms.  A PHQ-9 screen was performed with the patient scoring an 11.  A GAD-7 screen was also performed with the patient scoring a 15  Patient is alert and oriented x4, calm, cooperative, and fully engaged in conversation during the encounter.  Patient states that she feels down today and feels like crying.  Patient denies suicidal or homicidal ideations.  She further denies auditory or visual hallucinations and does not appear to be responding to internal/external stimuli.  Patient endorses good sleep and receives on average 8 to 9 hours of sleep each night.  Patient endorses poor appetite and eats on average 1 meal per day.  Patient endorses alcohol consumption sparingly.  Patient endorses tobacco use and smokes on average 4 cigarettes/day.  Patient denies illicit drug use.  Visit Diagnosis:    ICD-10-CM   1.  Generalized anxiety disorder  F41.1 PARoxetine (PAXIL) 40 MG tablet    clonazePAM (KLONOPIN) 0.5 MG tablet    2. Bipolar depression ([place])  F31.9 PARoxetine (PAXIL) 40 MG tablet    ARIPiprazole (ABILIFY) 5 MG tablet      Past Psychiatric History:  Generalized anxiety disorder Bipolar disorder  Past Medical History:  Past Medical History:  Diagnosis Date   Anxiety    Arthritis     Phreesia 03/08/2020   Bladder infection    Complication of anesthesia    Epidural did not work w/2nd preg. labor   Depression    No med. currently, Stopped with + UPT   Depression 06/27/2012   Gonorrhea    Hemorrhoids 06/27/2012   Osteoarthritis 06/27/2012   Osteoporosis    [employer]reesia 03/08/2020   Panic disorder 06/27/2012   Sleep apnea    Phreesia 03/08/2020    Past Surgical History:  Procedure Laterality Date   BREAST MASS EXCISION     Benign   BREAST SURGERY N/A    Phreesia 03/08/2020   CESAREAN SECTION     X 1 1st preg., then VBAC   CESAREAN SECTION N/A 01/27/2013   Procedure: CESAREAN SECTION;  Surgeon: Shelly Bombard, MD;  Location: [facility];  Service: Obstetrics;  Laterality: N/A;   MOUTH SURGERY      Family Psychiatric History:  Mother - Major depressive disorder  Family History:  Family History  Problem Relation Age of Onset   Cancer Mother    Other Neg Hx    Alcohol abuse Neg Hx    Arthritis Neg Hx    Asthma Neg Hx    Birth defects Neg Hx    COPD Neg Hx    Depression Neg Hx    Drug abuse Neg Hx    Diabetes Neg Hx    Early death Neg Hx    Hearing loss Neg Hx    Heart disease Neg Hx    Hyperlipidemia Neg Hx    Hypertension Neg Hx    Kidney disease Neg Hx    Learning disabilities Neg Hx    Mental illness Neg Hx    Mental retardation Neg Hx    Miscarriages / Stillbirths Neg Hx    Stroke Neg Hx    Vision loss Neg Hx     Social History:  Social History   Socioeconomic History   Marital status: Widowed    Spouse name: Not on file   Number of children: Not on file   Years of education: Not on file   Highest education level: Not on file  Occupational History   Not on file  Tobacco Use   Smoking status: Every Day    Packs/day: 0.25    Types: Cigarettes   Smokeless tobacco: Never  Vaping Use   Vaping Use: Never used  Substance and Sexual Activity   Alcohol use: Yes    Comment: occ   Drug use: No   Sexual activity: Yes    Birth  control/protection: None    Comment: Last intercourse at least 1 week ago  Other Topics Concern   Not on file  Social History Narrative   Not on file   Social Determinants of Health   Financial Resource Strain: Low Risk    Difficulty of Paying Living Expenses: Not hard at all  Food Insecurity: No Food Insecurity   Worried About Charity fundraiser in the Last Year: Never true   [place] in the Last Year:  Never true  Transportation Needs: No Transportation Needs   Lack of Transportation (Medical): No   Lack of Transportation (Non-Medical): No  Physical Activity: Inactive   Days of Exercise per Week: 0 days   Minutes of Exercise per Session: 0 min  Stress: Stress Concern Present   Feeling of Stress : Rather much  Social Connections: Moderately Isolated   Frequency of Communication with Friends and Family: More than three times a week   Frequency of Social Gatherings with Friends and Family: Twice a week   Attends Religious Services: More than 4 times per year   Active Member of [employer] or Organizations: No   Attends Archivist Meetings: Never   Marital Status: Widowed    Allergies:  Allergies  Allergen Reactions   Penicillins     Metabolic Disorder Labs: No results found for: HGBA1C, MPG No results found for: PROLACTIN Lab Results  Component Value Date   CHOL 188 01/26/2020   TRIG 58 01/26/2020   HDL 47 01/26/2020   CHOLHDL 4.0 01/26/2020   LDLCALC 130 (H) 01/26/2020   Lab Results  Component Value Date   TSH 1.350 01/26/2020    Therapeutic Level Labs: No results found for: LITHIUM No results found for: VALPROATE No components found for:  CBMZ  Current Medications: Current Outpatient Medications  Medication Sig Dispense Refill   albuterol (VENTOLIN HFA) 108 (90 Base) MCG/ACT inhaler Inhale 1-2 puffs into the lungs every 6 (six) hours as needed for wheezing or shortness of breath. 18 g 2   ARIPiprazole (ABILIFY) 5 MG tablet Take 1 tablet (5 mg  total) by mouth daily. 30 tablet 1   [START ON 05/26/2021] clonazePAM (KLONOPIN) 0.5 MG tablet Take 1 tablet (0.5 mg total) by mouth 2 (two) times daily as needed for anxiety. 60 tablet 1   cyclobenzaprine (FLEXERIL) 5 MG tablet Take 1-2 tablets (5-10 mg total) by mouth at bedtime. 30 tablet 1   Drospirenone (SLYND) 4 MG TABS Take 1 tablet by mouth daily. 84 tablet 4   hydrOXYzine (ATARAX/VISTARIL) 10 MG tablet Take 1 tablet (10 mg total) by mouth 3 (three) times daily as needed. 60 tablet 2   pantoprazole (PROTONIX) 40 MG tablet Take 1 tablet (40 mg total) by mouth daily. 30 tablet 3   PARoxetine (PAXIL) 40 MG tablet Take 1 tablet (40 mg total) by mouth daily. 30 tablet 1   Vitamin D, Ergocalciferol, (DRISDOL) 1.25 MG (50000 UNIT) CAPS capsule TAKE 1 CAPSULE (50,000 UNITS TOTAL) BY MOUTH EVERY 7 (SEVEN) DAYS 4 capsule 2   No current facility-administered medications for this visit.     Musculoskeletal: Strength & Muscle Tone: within normal limits Gait & Station: normal Patient leans: Right  Psychiatric Specialty Exam: Review of Systems  Psychiatric/Behavioral:  Negative for decreased concentration, dysphoric [name], hallucinations, self-injury, sleep disturbance and suicidal ideas. The patient is nervous/anxious. The patient is not hyperactive.    There were no vitals taken for this visit.There is no height or weight on file to calculate BMI.  General Appearance: Well Groomed  Eye Contact:  Good  Speech:  Clear and Coherent and Normal Rate  Volume:  Normal  [name]:  Anxious and Depressed  Affect:  Congruent and Depressed  Thought Process:  Coherent, Goal Directed, and Descriptions of Associations: Intact  Orientation:  Full (Time, Place, and Person)  Thought Content: WDL and Rumination   Suicidal Thoughts:  No  Homicidal Thoughts:  No  Memory:  Immediate;   Good Recent;   Good  Remote;   Good  Judgement:  Good  Insight:  Good  Psychomotor Activity:  Normal  Concentration:   Concentration: Good and Attention Span: Good  Recall:  Good  Fund of Knowledge: Good  Language: Good  Akathisia:  NA  Handed:  Right  AIMS (if indicated): not done  Assets:  Communication Skills Desire for Improvement Housing Social Support  ADL's:  Intact  Cognition: WNL  Sleep:  Good   Screenings: GAD-7    Flowsheet Row Video Visit from 05/25/2021 in [facility] Office Visit from 04/18/2021 in [place] [facility] Video Visit from 03/23/2021 in [facility] Office Visit from 02/09/2021 in [facility] Office Visit from 01/03/2021 in [place] 1  Total GAD-7 Score 15 13 15 19 14       PHQ2-9    Flowsheet Row Video Visit from 05/25/2021 in [facility] Office Visit from 05/22/2021 in [facility] [place] Visit from 04/18/2021 in [place] 1 Video Visit from 03/23/2021 in [facility] Counselor from 03/17/2021 in [facility]  PHQ-2 Total Score 2 0 0 0 0  PHQ-9 Total Score 11 2 2  -- --      Flowsheet Row Video Visit from 05/25/2021 in [facility] Video Visit from 03/23/2021 in [facility] Counselor from 03/17/2021 in [place] No Risk No Risk No Risk        Assessment and Plan:   Alicia [name] is a 40 year old female with a past psychiatric history significant for bipolar disorder and generalized anxiety disorder who presents to [facility] via virtual telephone visit for follow-up and medication management.  Patient endorses worsening depression and anxiety due to changes in the weather, work-related stress, and the upcoming birthday of her deceased husband.  Patient is interested in increasing her dosage of her paroxetine to help better  manage her depression and anxiety.  Patient's paroxetine to be increased from 20 mg to 40 mg daily.  Patient's medications to be e-prescribed to pharmacy of choice.  1. Generalized anxiety disorder  - PARoxetine (PAXIL) 40 MG tablet; Take 1 tablet (40 mg total) by mouth daily.  Dispense: 30 tablet; Refill: 1 - clonazePAM (KLONOPIN) 0.5 MG tablet; Take 1 tablet (0.5 mg total) by mouth 2 (two) times daily as needed for anxiety.  Dispense: 60 tablet; Refill: 1  2. Bipolar depression ([place])  - PARoxetine (PAXIL) 40 MG tablet; Take 1 tablet (40 mg total) by mouth daily.  Dispense: 30 tablet; Refill: 1 - ARIPiprazole (ABILIFY) 5 MG tablet; Take 1 tablet (5 mg total) by mouth daily.  Dispense: 30 tablet; Refill: 1  Patient to follow up in 2 months Provider spent a total of 15 minutes with the patient/reviewing patient's chart  Alicia Mood, PA 05/25/2021, 3:54 PM

## 2021-05-31 DIAGNOSIS — F3132 Bipolar disorder, current episode depressed, moderate: Secondary | ICD-10-CM | POA: Diagnosis not present

## 2021-06-07 ENCOUNTER — Ambulatory Visit: Payer: Medicaid Other | Admitting: Podiatry

## 2021-06-07 ENCOUNTER — Other Ambulatory Visit: Payer: Self-pay

## 2021-06-07 DIAGNOSIS — D492 Neoplasm of unspecified behavior of bone, soft tissue, and skin: Secondary | ICD-10-CM | POA: Diagnosis not present

## 2021-06-12 ENCOUNTER — Encounter: Payer: Self-pay | Admitting: Podiatry

## 2021-06-12 NOTE — Progress Notes (Signed)
Subjective:  Patient ID: Alicia [name], female    DOB: 1980-12-25,  MRN: [id number]  Chief Complaint  Patient presents with   Foot Pain    Right foot pain PT stated that she is still having pain with her foot     40 y.o. female presents with the above complaint.  Patient presents with a follow-up of right lateral hyperkeratotic lesion/skin plasm.  She states debridement was helping.  She would like to discuss more definitive treatment option.  She is tried everything else and has failed all other conservative treatment options.   Review of Systems: Negative except as noted in the HPI. Denies N/V/F/Ch.  Past Medical History:  Diagnosis Date   Anxiety    Arthritis    [employer]reesia 03/08/2020   Bladder infection    Complication of anesthesia    Epidural did not work w/2nd preg. labor   Depression    No med. currently, Stopped with + UPT   Depression 06/27/2012   Gonorrhea    Hemorrhoids 06/27/2012   Osteoarthritis 06/27/2012   Osteoporosis    Phreesia 03/08/2020   Panic disorder 06/27/2012   Sleep apnea    [facility]reesia 03/08/2020    Current Outpatient Medications:    albuterol (VENTOLIN HFA) 108 (90 Base) MCG/ACT inhaler, Inhale 1-2 puffs into the lungs every 6 (six) hours as needed for wheezing or shortness of breath., Disp: 18 g, Rfl: 2   ARIPiprazole (ABILIFY) 5 MG tablet, Take 1 tablet (5 mg total) by mouth daily., Disp: 30 tablet, Rfl: 1   clonazePAM (KLONOPIN) 0.5 MG tablet, Take 1 tablet (0.5 mg total) by mouth 2 (two) times daily as needed for anxiety., Disp: 60 tablet, Rfl: 1   cyclobenzaprine (FLEXERIL) 5 MG tablet, Take 1-2 tablets (5-10 mg total) by mouth at bedtime., Disp: 30 tablet, Rfl: 1   Drospirenone (SLYND) 4 MG TABS, Take 1 tablet by mouth daily., Disp: 84 tablet, Rfl: 4   hydrOXYzine (ATARAX/VISTARIL) 10 MG tablet, Take 1 tablet (10 mg total) by mouth 3 (three) times daily as needed., Disp: 60 tablet, Rfl: 2   pantoprazole (PROTONIX) 40 MG tablet, Take 1 tablet  (40 mg total) by mouth daily., Disp: 30 tablet, Rfl: 3   PARoxetine (PAXIL) 40 MG tablet, Take 1 tablet (40 mg total) by mouth daily., Disp: 30 tablet, Rfl: 1   Vitamin D, Ergocalciferol, (DRISDOL) 1.25 MG (50000 UNIT) CAPS capsule, TAKE 1 CAPSULE (50,000 UNITS TOTAL) BY MOUTH EVERY 7 (SEVEN) DAYS, Disp: 4 capsule, Rfl: 2  Social History   Tobacco Use  Smoking Status Every Day   Packs/day: 0.25   Types: Cigarettes  Smokeless Tobacco Never    Allergies  Allergen Reactions   Penicillins    Objective:  There were no vitals filed for this visit. There is no height or weight on file to calculate BMI. Constitutional Well developed. Well nourished.  Vascular Dorsalis pedis pulses palpable bilaterally. Posterior tibial pulses palpable bilaterally. Capillary refill normal to all digits.  No cyanosis or clubbing noted. Pedal hair growth normal.  Neurologic Normal speech. Oriented to person, place, and time. Epicritic sensation to light touch grossly present bilaterally.  Dermatologic  hyperkeratotic lesion with central nucleated core noted to the right plantar lateral foot.  No pain at the peroneal tendon no pain at the fifth metatarsophalangeal joint.  Pain on palpation to this lesion.  No pinpoint bleeding noted upon debridement.  Orthopedic: Normal joint ROM without pain or crepitus bilaterally. No visible deformities. No bony tenderness.   Radiographs:  Three views of skeletally mature adult right foot: X-ray is within normal limits.  No stress fracture noted no actual fracture noted.  No other bony abnormalities identified Assessment:   1. Skin neoplasm    Plan:  Patient was evaluated and treated and all questions answered.  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  I  discussed the risk and complication associated with this office procedure patient would like to proceed despite the risks  Excision of the skin lesion Skin was prepped in standard technique with Betadine.  One-to-one mixture of 1% lidocaine plain half percent Marcaine plain was injected circumferentially in V-block fashion 3 cc.  3 mm punch biopsy was utilized to remove the lesion in its entirety down to the level of the subcutaneous tissue.  The wound site was dressed with triple antibiotic 4 x 4 gauze Kerlix and Ace bandage.  I have asked the patient to keep it covered with Neosporin and a Band-Aid until healing.  I will see her back again in 6 weeks    No follow-ups on file.

## 2021-06-24 ENCOUNTER — Encounter: Payer: Self-pay | Admitting: Podiatry

## 2021-06-26 DIAGNOSIS — F3132 Bipolar disorder, current episode depressed, moderate: Secondary | ICD-10-CM | POA: Diagnosis not present

## 2021-07-03 ENCOUNTER — Ambulatory Visit (INDEPENDENT_AMBULATORY_CARE_PROVIDER_SITE_OTHER): Payer: Medicaid Other | Admitting: Internal Medicine

## 2021-07-03 ENCOUNTER — Encounter: Payer: Self-pay | Admitting: Internal Medicine

## 2021-07-03 ENCOUNTER — Other Ambulatory Visit: Payer: Self-pay

## 2021-07-03 ENCOUNTER — Ambulatory Visit (INDEPENDENT_AMBULATORY_CARE_PROVIDER_SITE_OTHER): Payer: Medicaid Other

## 2021-07-03 DIAGNOSIS — R42 Dizziness and giddiness: Secondary | ICD-10-CM

## 2021-07-03 NOTE — Assessment & Plan Note (Addendum)
I will place a monitor and obtain an echocardiogram.  I will check a CMP, CBC, and TSH.  I believe her chest pain is atypical and only comes on with stress so at this point in time I do not think an ischemic evaluation is necessary.  Her symptoms sound vasovagal in nature.  I have asked her to avoid stressful situations and to try to control her anxiety.  Her blood pressure is a little low but she is asymptomatic from this.  Certainly if she were to develop symptoms with a low blood pressure midodrine could be considered.  I will see her back in 6 months or earlier if needed.

## 2021-07-03 NOTE — Progress Notes (Signed)
Cardiology Office Note:    Date:  07/03/2021   ID:  Alicia [name], DOB [birth year], MRN [id number]  PCP:  Alicia Mai, MD   [place] [facility] Cardiologist:  Early Osmond, MD     Referring MD: Alicia Mai, MD   Chief Complaint:  Presyncope/syncope  History of Present Illness:    Alicia [name] is a 40 y.o. female with the indicated medical problems here for recommendations regarding presyncope.  She was seen in her primary care provider's office at the end of September.  There her blood pressure was in the 90s.  She had reported some presyncope at times.  This has been happening for a while.  She tells me the first time this happened to her she remembers that it happened was in 11-15-14 after her husband died.  It is happened sporadically since then.  She had an episode after she went to the bathroom where she nearly passed out.  She also had another episode after smoking a cigarette.  She was on her bed and became very hot with a little diaphoresis and some nausea.  Another episode happened when she was at the grocery store after smoking a cigarette.  She denies any exertional chest pain.  When she gets very anxious she will get some chest tightness.  This seems to be relieved when her anxiety is related.  She denies any exertional dyspnea, palpitations, paroxysmal nocturnal dyspnea, orthopnea, or peripheral edema.  She has had no bad bleeding or bruising or signs or symptoms of stroke.  She has required no emergency room visits or hospitalizations.    Previous Medical/Surgical History:    Past Medical History:  Diagnosis Date   Anxiety    Arthritis    Phreesia 03/08/2020   Bladder infection    Complication of anesthesia    Epidural did not work w/2nd preg. labor   Depression    No med. currently, Stopped with + UPT   Depression 06/27/2012   Gonorrhea    Hemorrhoids 06/27/2012   Osteoarthritis 06/27/2012   Osteoporosis    Phreesia 03/08/2020   Panic disorder  06/27/2012   Sleep apnea    Phreesia 03/08/2020    Past Surgical History:  Procedure Laterality Date   BREAST MASS EXCISION     Benign   BREAST SURGERY N/A    Phreesia 03/08/2020   CESAREAN SECTION     X 1 1st preg., then VBAC   CESAREAN SECTION N/A 01/27/2013   Procedure: CESAREAN SECTION;  Surgeon: Shelly Bombard, MD;  Location: [facility];  Service: Obstetrics;  Laterality: N/A;   MOUTH SURGERY      Current Medications: Current Meds  Medication Sig   albuterol (VENTOLIN HFA) 108 (90 Base) MCG/ACT inhaler Inhale 1-2 puffs into the lungs every 6 (six) hours as needed for wheezing or shortness of breath.   ARIPiprazole (ABILIFY) 5 MG tablet Take 1 tablet (5 mg total) by mouth daily.   clonazePAM (KLONOPIN) 0.5 MG tablet Take 1 tablet (0.5 mg total) by mouth 2 (two) times daily as needed for anxiety.   cyclobenzaprine (FLEXERIL) 5 MG tablet Take 1-2 tablets (5-10 mg total) by mouth at bedtime.   Drospirenone (SLYND) 4 MG TABS Take 1 tablet by mouth daily.   hydrOXYzine (ATARAX/VISTARIL) 10 MG tablet Take 1 tablet (10 mg total) by mouth 3 (three) times daily as needed.   pantoprazole (PROTONIX) 40 MG tablet Take 1 tablet (40 mg total) by mouth daily.   PARoxetine (PAXIL) 40 MG tablet  Take 1 tablet (40 mg total) by mouth daily.   Vitamin D, Ergocalciferol, (DRISDOL) 1.25 MG (50000 UNIT) CAPS capsule TAKE 1 CAPSULE (50,000 UNITS TOTAL) BY MOUTH EVERY 7 (SEVEN) DAYS     Allergies:   Penicillins   Social History:    Social History   Socioeconomic History   Marital status: Widowed    Spouse name: Not on file   Number of children: Not on file   Years of education: Not on file   Highest education level: Not on file  Occupational History   Not on file  Tobacco Use   Smoking status: Every Day    Packs/day: 0.25    Types: Cigarettes   Smokeless tobacco: Never  Vaping Use   Vaping Use: Never used  Substance and Sexual Activity   Alcohol use: Yes    Comment: occ   Drug use: No    Sexual activity: Yes    Birth control/protection: None    Comment: Last intercourse at least 1 week ago  Other Topics Concern   Not on file  Social History Narrative   Not on file   Social Determinants of Health   Financial Resource Strain: Low Risk    Difficulty of Paying Living Expenses: Not hard at all  Food Insecurity: No Food Insecurity   Worried About Charity fundraiser in the Last Year: Never true   Ran Out of Food in the Last Year: Never true  Transportation Needs: No Transportation Needs   Lack of Transportation (Medical): No   Lack of Transportation (Non-Medical): No  Physical Activity: Inactive   Days of Exercise per Week: 0 days   Minutes of Exercise per Session: 0 min  Stress: Stress Concern Present   Feeling of Stress : Rather much  Social Connections: Moderately Isolated   Frequency of Communication with Friends and Family: More than three times a week   Frequency of Social Gatherings with Friends and Family: Twice a week   Attends Religious Services: More than 4 times per year   Active Member of [employer] or Organizations: No   Attends Archivist Meetings: Never   Marital Status: Widowed     Family History:   Family History  Problem Relation Age of Onset   Cancer Mother    Other Neg Hx    Alcohol abuse Neg Hx    Arthritis Neg Hx    Asthma Neg Hx    Birth defects Neg Hx    COPD Neg Hx    Depression Neg Hx    Drug abuse Neg Hx    Diabetes Neg Hx    Early death Neg Hx    Hearing loss Neg Hx    Heart disease Neg Hx    Hyperlipidemia Neg Hx    Hypertension Neg Hx    Kidney disease Neg Hx    Learning disabilities Neg Hx    Mental illness Neg Hx    Mental retardation Neg Hx    Miscarriages / Stillbirths Neg Hx    Stroke Neg Hx    Vision loss Neg Hx      ROS:  Please see the history of present illness.  All other systems reviewed and are negative.  EKGs/Labs/Other Studies Reviewed:    The following studies were reviewed today:   EKG:    The ekg ordered today demonstrates normal sinus rhythm  Recent Labs: 01/03/2021: ALT 15   Recent Lipid Panel    Component Value Date/Time   CHOL 188  01/26/2020 1102   TRIG 58 01/26/2020 1102   HDL 47 01/26/2020 1102   CHOLHDL 4.0 01/26/2020 1102   LDLCALC 130 (H) 01/26/2020 1102     Risk Assessment/Calculations:          Physical Exam:    VS:  BP 102/70   Pulse 82   Ht 5' (1.524 m)   Wt 225 lb 9.6 oz (102.3 kg)   BMI 44.06 kg/m     Wt Readings from Last 3 Encounters:  07/03/21 225 lb 9.6 oz (102.3 kg)  05/22/21 220 lb (99.8 kg)  04/18/21 227 lb (103 kg)     GEN:  Well nourished, well developed in no acute distress HEENT: Normal NECK: No JVD; No carotid bruits LYMPHATICS: No lymphadenopathy CARDIAC: RRR, no murmurs, rubs, gallops RESPIRATORY:  Clear to auscultation without rales, wheezing or rhonchi  ABDOMEN: Soft, non-tender, non-distended MUSCULOSKELETAL:  No edema; No deformity  SKIN: Warm and dry NEUROLOGIC:  Alert and oriented x 3 PSYCHIATRIC:  Normal affect   ASSESSMENT and PLAN   Lightheadedness I will place a monitor and obtain an echocardiogram.  I will check a CMP, CBC, and TSH.  I believe her chest pain is atypical and only comes on with stress so at this point in time I do not think an ischemic evaluation is necessary.  Her symptoms sound vasovagal in nature.  I have asked her to avoid stressful situations and to try to control her anxiety.  Her blood pressure is a little low but she is asymptomatic from this.  Certainly if she were to develop symptoms with a low blood pressure midodrine could be considered.  I will see her back in 6 months or earlier if needed.             Medication Adjustments/Labs and Tests Ordered: Current medicines are reviewed at length with the patient today.  Concerns regarding medicines are outlined above.  Orders Placed This Encounter  Procedures   CBC   TSH   Comprehensive metabolic panel   LONG TERM MONITOR  (3-14 DAYS)   EKG 12-Lead   ECHOCARDIOGRAM COMPLETE   No orders of the defined types were placed in this encounter.   Patient Instructions  Medication Instructions:  Your physician recommends that you continue on your current medications as directed. Please refer to the Current Medication list given to you today.  *If you need a refill on your cardiac medications before your next appointment, please call your pharmacy*  Testing/Procedures: Your physician has requested that you have an echocardiogram. Echocardiography is a painless test that uses sound waves to create images of your heart. It provides your doctor with information about the size and shape of your heart and how well your heart's chambers and valves are working. This procedure takes approximately one hour. There are no restrictions for this procedure.  Your physician has recommended that you wear an event monitor. Event monitors are medical devices that record the heart's electrical activity. Doctors most often [place] these monitors to diagnose arrhythmias. Arrhythmias are problems with the speed or rhythm of the heartbeat. The monitor is a small, portable device. You can wear one while you do your normal daily activities. This is usually used to diagnose what is causing palpitations/syncope (passing out).  Follow-Up: At [facility], you and your health needs are our priority.  As part of our continuing mission to provide you with exceptional heart care, we have created designated Provider Care Teams.  These Care Teams include your  primary Cardiologist (physician) and Advanced Practice Providers (APPs -  Physician Assistants and Nurse Practitioners) who all work together to provide you with the care you need, when you need it.   Your next appointment:   6 month(s)  The format for your next appointment:   In Person  Provider:   Early Osmond, MD   Other Instructions Bryn Gulling- Long Term Monitor Instructions  Your physician  has requested you wear a ZIO patch monitor for 14 days.  This is a single patch monitor. [employer] supplies one patch monitor per enrollment. Additional stickers are not available. Please do not apply patch if you will be having a Nuclear Stress Test,  Echocardiogram, Cardiac CT, MRI, or Chest Xray during the period you would be wearing the  monitor. The patch cannot be worn during these tests. You cannot remove and re-apply the  ZIO XT patch monitor.  Your ZIO patch monitor will be mailed 3 day USPS to your address on file. It may take 3-5 days  to receive your monitor after you have been enrolled.  Once you have received your monitor, please review the enclosed instructions. Your monitor  has already been registered assigning a specific monitor serial # to you.  Billing and Patient Assistance Program Information  We have supplied [employer] with any of your insurance information on file for billing purposes. [employer] offers a sliding scale Patient Assistance Program for patients that do not have  insurance, or whose insurance does not completely cover the cost of the ZIO monitor.  You must apply for the Patient Assistance Program to qualify for this discounted rate.  To apply, please call [employer] at [phone], select option 4, select option 2, ask to apply for  Patient Assistance Program. Theodore Demark will ask your household income, and how many people  are in your household. They will quote your out-of-pocket cost based on that information.  [employer] will also be able to set up a 50-month, interest-free payment plan if needed.  Applying the monitor   Shave hair from upper left chest.  Hold abrader disc by orange tab. Rub abrader in 40 strokes over the upper left chest as  indicated in your monitor instructions.  Clean area with 4 enclosed alcohol pads. Let dry.  Apply patch as indicated in monitor instructions. Patch will be placed under collarbone on left  side of chest with arrow pointing  upward.  Rub patch adhesive wings for 2 minutes. Remove white label marked "1". Remove the white  label marked "2". Rub patch adhesive wings for 2 additional minutes.  While looking in a mirror, press and release button in center of patch. A small green light will  flash 3-4 times. This will be your only indicator that the monitor has been turned on.  Do not shower for the first 24 hours. You may shower after the first 24 hours.  Press the button if you feel a symptom. You will hear a small click. Record Date, Time and  Symptom in the Patient Logbook.  When you are ready to remove the patch, follow instructions on the last 2 pages of Patient  Logbook. Stick patch monitor onto the last page of Patient Logbook.  Place Patient Logbook in the blue and white box. Use locking tab on box and tape box closed  securely. The blue and white box has prepaid postage on it. Please place it in the mailbox as  soon as possible. Your physician should have your test results approximately 7  days after the  monitor has been mailed back to [place].  Call [employer] at [phone] if you have questions regarding  your ZIO XT patch monitor. Call them immediately if you see an orange light blinking on your  monitor.  If your monitor falls off in less than 4 days, contact our Monitor department at [phone].  If your monitor becomes loose or falls off after 4 days call [employer] at [phone] for  suggestions on securing your monitor     Signed, Early Osmond, MD  07/03/2021 3:15 PM    [place]

## 2021-07-03 NOTE — Patient Instructions (Signed)
Medication Instructions:  Your physician recommends that you continue on your current medications as directed. Please refer to the Current Medication list given to you today.  *If you need a refill on your cardiac medications before your next appointment, please call your pharmacy*  Testing/Procedures: Your physician has requested that you have an echocardiogram. Echocardiography is a painless test that uses sound waves to create images of your heart. It provides your doctor with information about the size and shape of your heart and how well your heart's chambers and valves are working. This procedure takes approximately one hour. There are no restrictions for this procedure.  Your physician has recommended that you wear an event monitor. Event monitors are medical devices that record the heart's electrical activity. Doctors most often [place] these monitors to diagnose arrhythmias. Arrhythmias are problems with the speed or rhythm of the heartbeat. The monitor is a small, portable device. You can wear one while you do your normal daily activities. This is usually used to diagnose what is causing palpitations/syncope (passing out).  Follow-Up: At [facility], you and your health needs are our priority.  As part of our continuing mission to provide you with exceptional heart care, we have created designated Provider Care Teams.  These Care Teams include your primary Cardiologist (physician) and Advanced Practice Providers (APPs -  Physician Assistants and Nurse Practitioners) who all work together to provide you with the care you need, when you need it.   Your next appointment:   6 month(s)  The format for your next appointment:   In Person  Provider:   Early Osmond, MD   Other Instructions Bryn Gulling- Long Term Monitor Instructions  Your physician has requested you wear a ZIO patch monitor for 14 days.  This is a single patch monitor. [employer] supplies one patch monitor per enrollment.  Additional stickers are not available. Please do not apply patch if you will be having a Nuclear Stress Test,  Echocardiogram, Cardiac CT, MRI, or Chest Xray during the period you would be wearing the  monitor. The patch cannot be worn during these tests. You cannot remove and re-apply the  ZIO XT patch monitor.  Your ZIO patch monitor will be mailed 3 day USPS to your address on file. It may take 3-5 days  to receive your monitor after you have been enrolled.  Once you have received your monitor, please review the enclosed instructions. Your monitor  has already been registered assigning a specific monitor serial # to you.  Billing and Patient Assistance Program Information  We have supplied [employer] with any of your insurance information on file for billing purposes. [employer] offers a sliding scale Patient Assistance Program for patients that do not have  insurance, or whose insurance does not completely cover the cost of the ZIO monitor.  You must apply for the Patient Assistance Program to qualify for this discounted rate.  To apply, please call [employer] at [phone], select option 4, select option 2, ask to apply for  Patient Assistance Program. Theodore Demark will ask your household income, and how many people  are in your household. They will quote your out-of-pocket cost based on that information.  [employer] will also be able to set up a 43-month, interest-free payment plan if needed.  Applying the monitor   Shave hair from upper left chest.  Hold abrader disc by orange tab. Rub abrader in 40 strokes over the upper left chest as  indicated in your monitor instructions.  Clean area  with 4 enclosed alcohol pads. Let dry.  Apply patch as indicated in monitor instructions. Patch will be placed under collarbone on left  side of chest with arrow pointing upward.  Rub patch adhesive wings for 2 minutes. Remove white label marked "1". Remove the white  label marked "2". Rub patch adhesive wings  for 2 additional minutes.  While looking in a mirror, press and release button in center of patch. A small green light will  flash 3-4 times. This will be your only indicator that the monitor has been turned on.  Do not shower for the first 24 hours. You may shower after the first 24 hours.  Press the button if you feel a symptom. You will hear a small click. Record Date, Time and  Symptom in the Patient Logbook.  When you are ready to remove the patch, follow instructions on the last 2 pages of Patient  Logbook. Stick patch monitor onto the last page of Patient Logbook.  Place Patient Logbook in the blue and white box. Use locking tab on box and tape box closed  securely. The blue and white box has prepaid postage on it. Please place it in the mailbox as  soon as possible. Your physician should have your test results approximately 7 days after the  monitor has been mailed back to [facility].  Call [facility] at [phone] if you have questions regarding  your ZIO XT patch monitor. Call them immediately if you see an orange light blinking on your  monitor.  If your monitor falls off in less than 4 days, contact our Monitor department at [phone].  If your monitor becomes loose or falls off after 4 days call [employer] at [phone] for  suggestions on securing your monitor

## 2021-07-03 NOTE — Progress Notes (Unsigned)
Patient enrolled for [employer] to mail a 14 day ZIO XT monitor to her address on file.

## 2021-07-04 LAB — COMPREHENSIVE METABOLIC PANEL
ALT: 13 IU/L (ref 0–32)
AST: 13 IU/L (ref 0–40)
Albumin/Globulin Ratio: 1.2 (ref 1.2–2.2)
Albumin: 3.7 g/dL — ABNORMAL LOW (ref 3.8–4.8)
Alkaline Phosphatase: 67 IU/L (ref 44–121)
BUN/Creatinine Ratio: 12 (ref 9–23)
BUN: 8 mg/dL (ref 6–20)
Bilirubin Total: 0.7 mg/dL (ref 0.0–1.2)
CO2: 26 mmol/L (ref 20–29)
Calcium: 9.1 mg/dL (ref 8.7–10.2)
Chloride: 102 mmol/L (ref 96–106)
Creatinine, Ser: 0.65 mg/dL (ref 0.57–1.00)
Globulin, Total: 3.2 g/dL (ref 1.5–4.5)
Glucose: 85 mg/dL (ref 70–99)
Potassium: 3.7 mmol/L (ref 3.5–5.2)
Sodium: 138 mmol/L (ref 134–144)
Total Protein: 6.9 g/dL (ref 6.0–8.5)
eGFR: 115 mL/min/{1.73_m2} (ref >59)

## 2021-07-04 LAB — CBC
Hematocrit: 39.6 % (ref 34.0–46.6)
Hemoglobin: 12.7 g/dL (ref 11.1–15.9)
MCH: 26.5 pg — ABNORMAL LOW (ref 26.6–33.0)
MCHC: 32.1 g/dL (ref 31.5–35.7)
MCV: 83 fL (ref 79–97)
Platelets: 242 10*3/uL (ref 150–450)
RBC: 4.79 x10E6/uL (ref 3.77–5.28)
RDW: 13.1 % (ref 11.7–15.4)
WBC: 5.3 10*3/uL (ref 3.4–10.8)

## 2021-07-04 LAB — TSH: TSH: 0.581 u[IU]/mL (ref 0.450–4.500)

## 2021-07-08 DIAGNOSIS — R42 Dizziness and giddiness: Secondary | ICD-10-CM | POA: Diagnosis not present

## 2021-07-19 ENCOUNTER — Ambulatory Visit: Payer: Medicaid Other | Admitting: Podiatry

## 2021-07-25 ENCOUNTER — Ambulatory Visit (HOSPITAL_COMMUNITY): Payer: Medicaid Other | Attending: Cardiology

## 2021-07-25 ENCOUNTER — Other Ambulatory Visit: Payer: Self-pay

## 2021-07-25 ENCOUNTER — Other Ambulatory Visit: Payer: Self-pay | Admitting: Physician Assistant

## 2021-07-25 ENCOUNTER — Other Ambulatory Visit: Payer: Self-pay | Admitting: Internal Medicine

## 2021-07-25 DIAGNOSIS — K219 Gastro-esophageal reflux disease without esophagitis: Secondary | ICD-10-CM

## 2021-07-25 DIAGNOSIS — R42 Dizziness and giddiness: Secondary | ICD-10-CM | POA: Diagnosis not present

## 2021-07-25 DIAGNOSIS — E559 Vitamin D deficiency, unspecified: Secondary | ICD-10-CM

## 2021-07-25 DIAGNOSIS — F3132 Bipolar disorder, current episode depressed, moderate: Secondary | ICD-10-CM | POA: Diagnosis not present

## 2021-07-25 DIAGNOSIS — F411 Generalized anxiety disorder: Secondary | ICD-10-CM

## 2021-07-25 DIAGNOSIS — I361 Nonrheumatic tricuspid (valve) insufficiency: Secondary | ICD-10-CM | POA: Diagnosis not present

## 2021-07-25 LAB — ECHOCARDIOGRAM COMPLETE
Area-P 1/2: 3.72 cm2
Calc EF: 60.8 %
S' Lateral: 3.1 cm
Single Plane A2C EF: 54.5 %
Single Plane A4C EF: 66.9 %

## 2021-07-26 ENCOUNTER — Telehealth (INDEPENDENT_AMBULATORY_CARE_PROVIDER_SITE_OTHER): Payer: Medicaid Other | Admitting: Physician Assistant

## 2021-07-26 DIAGNOSIS — F319 Bipolar disorder, unspecified: Secondary | ICD-10-CM | POA: Diagnosis not present

## 2021-07-26 DIAGNOSIS — F411 Generalized anxiety disorder: Secondary | ICD-10-CM

## 2021-07-26 MED ORDER — CLONAZEPAM 0.5 MG PO TABS: 0.5000 mg | ORAL_TABLET | Freq: Two times a day (BID) | ORAL | 1 refills | Status: DC | PRN

## 2021-07-26 MED ORDER — ARIPIPRAZOLE 5 MG PO TABS: 5.0000 mg | ORAL_TABLET | Freq: Every day | ORAL | 1 refills | Status: DC

## 2021-07-26 MED ORDER — PAROXETINE HCL 40 MG PO TABS
40.0000 mg | ORAL_TABLET | Freq: Every day | ORAL | 1 refills | Status: DC
Start: 2021-07-26 — End: 2021-09-27

## 2021-07-26 NOTE — Progress Notes (Addendum)
BH MD/PA/NP OP Progress Note  Virtual Visit via Telephone Note  I connected with Alicia [name] on 07/26/21 at  3:30 PM EST by telephone and verified that I am speaking with the correct person using two identifiers.  Location: Patient: Home Provider: Clinic   I discussed the limitations, risks, security and privacy concerns of performing an evaluation and management service by telephone and the availability of in person appointments. I also discussed with the patient that there may be a patient responsible charge related to this service. The patient expressed understanding and agreed to proceed.  Follow Up Instructions:   I discussed the assessment and treatment plan with the patient. The patient was provided an opportunity to ask questions and all were answered. The patient agreed with the plan and demonstrated an understanding of the instructions.   The patient was advised to call back or seek an in-person evaluation if the symptoms worsen or if the condition fails to improve as anticipated.  I provided 16 minutes of non-face-to-face time during this encounter.  Malachy Mood, PA   07/26/2021 3:49 PM Revella Shelton  MRN:  [id number]  Chief Complaint: Follow up and medication management  HPI:   Alicia [name] is a 40 year old female with a past psychiatric history significant for bipolar disorder and generalized anxiety disorder who presents to [facility] via virtual telephone visit for follow-up and medication management.  Patient is currently being managed on the following medications:  Paroxetine 40 mg daily Abilify 5 mg daily Clonazepam 0.5 mg 2 times daily as needed  Patient reports no issues with her current medication regimen.  Patient states that she experienced depressive symptoms and fluctuating mood 2 weeks ago.  She attributed changes within her mood due to the anniversary of the loss of her husband.  She reports that it is  normal for her to go through her emotions during specific months that hold significance to her.  Patient denies anxiety but states that she was on edge recently after going to get an ultrasound of her heart.  Patient denies any specific stressors and states that she is not stressed out about the holidays coming up.  She does endorse missing her mother and husband.  She reports that her husband's birthday is Christmas Eve and her mother passed away in 2022-09-16.  A PHQ-9 screen was performed with the patient scoring a 4.  A GAD-7 screen was also performed with the patient scoring a 13.  Patient was alert and oriented x4, calm, cooperative, and fully engaged in conversation during the encounter.  Patient endorses good mood.  Patient denies suicidal or homicidal ideations.  Patient further denies auditory or visual hallucinations and does not appear to be responding to internal/external stimuli.  Patient endorses good sleep and receives on average 7 to 8 hours of sleep each night patient endorses increased appetite and eats on average 2-3 meals per day.  Patient endorses alcohol consumption occasionally.  Patient endorses tobacco use and smokes between 1 and 10 cigarettes/day.  Patient denies illicit drug use.  Visit Diagnosis:    ICD-10-CM   1. Generalized anxiety disorder  F41.1 clonazePAM (KLONOPIN) 0.5 MG tablet    PARoxetine (PAXIL) 40 MG tablet    2. Bipolar depression ([place])  F31.9 PARoxetine (PAXIL) 40 MG tablet    ARIPiprazole (ABILIFY) 5 MG tablet      Past Psychiatric History:  Generalized anxiety disorder Bipolar disorder  Past Medical History:  Past Medical History:  Diagnosis Date  Anxiety    Arthritis    Phreesia 03/08/2020   Bladder infection    Complication of anesthesia    Epidural did not work w/2nd preg. labor   Depression    No med. currently, Stopped with + UPT   Depression 06/27/2012   Gonorrhea    Hemorrhoids 06/27/2012   Osteoarthritis 06/27/2012   Osteoporosis     [employer]reesia 03/08/2020   Panic disorder 06/27/2012   Sleep apnea    Phreesia 03/08/2020    Past Surgical History:  Procedure Laterality Date   BREAST MASS EXCISION     Benign   BREAST SURGERY N/A    [employer]reesia 03/08/2020   CESAREAN SECTION     X 1 1st preg., then VBAC   CESAREAN SECTION N/A 01/27/2013   Procedure: CESAREAN SECTION;  Surgeon: Shelly Bombard, MD;  Location: [facility];  Service: Obstetrics;  Laterality: N/A;   MOUTH SURGERY      Family Psychiatric History:  Mother - Major depressive disorder  Family History:  Family History  Problem Relation Age of Onset   Cancer Mother    Other Neg Hx    Alcohol abuse Neg Hx    Arthritis Neg Hx    Asthma Neg Hx    Birth defects Neg Hx    COPD Neg Hx    Depression Neg Hx    Drug abuse Neg Hx    Diabetes Neg Hx    Early death Neg Hx    Hearing loss Neg Hx    Heart disease Neg Hx    Hyperlipidemia Neg Hx    Hypertension Neg Hx    Kidney disease Neg Hx    Learning disabilities Neg Hx    Mental illness Neg Hx    Mental retardation Neg Hx    Miscarriages / Stillbirths Neg Hx    Stroke Neg Hx    Vision loss Neg Hx     Social History:  Social History   Socioeconomic History   Marital status: Widowed    Spouse name: Not on file   Number of children: Not on file   Years of education: Not on file   Highest education level: Not on file  Occupational History   Not on file  Tobacco Use   Smoking status: Every Day    Packs/day: 0.25    Types: Cigarettes   Smokeless tobacco: Never  Vaping Use   Vaping Use: Never used  Substance and Sexual Activity   Alcohol use: Yes    Comment: occ   Drug use: No   Sexual activity: Yes    Birth control/protection: None    Comment: Last intercourse at least 1 week ago  Other Topics Concern   Not on file  Social History Narrative   Not on file   Social Determinants of Health   Financial Resource Strain: Low Risk    Difficulty of Paying Living Expenses: Not hard at all  Food  Insecurity: No Food Insecurity   Worried About Charity fundraiser in the Last Year: Never true   Ran Out of Food in the Last Year: Never true  Transportation Needs: No Transportation Needs   Lack of Transportation (Medical): No   Lack of Transportation (Non-Medical): No  Physical Activity: Inactive   Days of Exercise per Week: 0 days   Minutes of Exercise per Session: 0 min  Stress: Stress Concern Present   Feeling of Stress : Rather much  Social Connections: Moderately Isolated   Frequency of Communication  with Friends and Family: More than three times a week   Frequency of Social Gatherings with Friends and Family: Twice a week   Attends Religious Services: More than 4 times per year   Active Member of [employer] or Organizations: No   Attends Archivist Meetings: Never   Marital Status: Widowed    Allergies:  Allergies  Allergen Reactions   Penicillins     Metabolic Disorder Labs: No results found for: HGBA1C, MPG No results found for: PROLACTIN Lab Results  Component Value Date   CHOL 188 01/26/2020   TRIG 58 01/26/2020   HDL 47 01/26/2020   CHOLHDL 4.0 01/26/2020   LDLCALC 130 (H) 01/26/2020   Lab Results  Component Value Date   TSH 0.581 07/03/2021   TSH 1.350 01/26/2020    Therapeutic Level Labs: No results found for: LITHIUM No results found for: VALPROATE No components found for:  CBMZ  Current Medications: Current Outpatient Medications  Medication Sig Dispense Refill   ARIPiprazole (ABILIFY) 5 MG tablet Take 1 tablet (5 mg total) by mouth daily. 30 tablet 1   clonazePAM (KLONOPIN) 0.5 MG tablet Take 1 tablet (0.5 mg total) by mouth 2 (two) times daily as needed for anxiety. 60 tablet 1   cyclobenzaprine (FLEXERIL) 5 MG tablet Take 1-2 tablets (5-10 mg total) by mouth at bedtime. 30 tablet 1   Drospirenone (SLYND) 4 MG TABS Take 1 tablet by mouth daily. 84 tablet 4   hydrOXYzine (ATARAX/VISTARIL) 10 MG tablet Take 1 tablet (10 mg total) by mouth  3 (three) times daily as needed. 60 tablet 2   pantoprazole (PROTONIX) 40 MG tablet Take 1 tablet (40 mg total) by mouth daily. 30 tablet 3   PARoxetine (PAXIL) 40 MG tablet Take 1 tablet (40 mg total) by mouth daily. 30 tablet 1   PROAIR HFA 108 (90 Base) MCG/ACT inhaler INHALE 1-2 PUFFS BY MOUTH EVERY 6 HOURS AS NEEDED FOR WHEEZE OR SHORTNESS OF BREATH 8.5 each 1   Vitamin D, Ergocalciferol, (DRISDOL) 1.25 MG (50000 UNIT) CAPS capsule TAKE 1 CAPSULE (50,000 UNITS TOTAL) BY MOUTH EVERY 7 (SEVEN) DAYS 4 capsule 2   No current facility-administered medications for this visit.     Musculoskeletal: Strength & Muscle Tone: Unable to assess due to telemedicine visit Tobias: Unable to assess due to telemedicine visit Patient leans: Unable to assess due to telemedicine visit  Psychiatric Specialty Exam: Review of Systems  Psychiatric/Behavioral:  Negative for decreased concentration, dysphoric mood, hallucinations, self-injury, sleep disturbance and suicidal ideas. The patient is nervous/anxious. The patient is not hyperactive.    There were no vitals taken for this visit.There is no height or weight on file to calculate BMI.  General Appearance: Unable to assess due to telemedicine visit  Eye Contact:  Unable to assess due to telemedicine visit  Speech:  Clear and Coherent and Normal Rate  Volume:  Normal  Mood:  Anxious and Euthymic  Affect:  Appropriate and Congruent  Thought Process:  Coherent and Descriptions of Associations: Intact  Orientation:  Full (Time, Place, and Person)  Thought Content: WDL and Rumination   Suicidal Thoughts:  No  Homicidal Thoughts:  No  Memory:  Immediate;   Good Recent;   Good Remote;   Good  Judgement:  Good  Insight:  Good  Psychomotor Activity:  Normal  Concentration:  Concentration: Good and Attention Span: Good  Recall:  Good  Fund of Knowledge: Good  Language: Good  Akathisia:  NA  Handed:  Right  AIMS (if indicated): not done   Assets:  Communication Skills Desire for Improvement Housing Social Support  ADL's:  Intact  Cognition: WNL  Sleep:  Good   Screenings: GAD-7    Flowsheet Row Video Visit from 07/26/2021 in [facility] Video Visit from 05/25/2021 in [facility] Office Visit from 04/18/2021 in [place] [facility] Video Visit from 03/23/2021 in [facility] Office Visit from 02/09/2021 in [facility]  Total GAD-7 Score 13 15 13 15 19       PHQ2-9    Flowsheet Row Video Visit from 07/26/2021 in [facility] Video Visit from 05/25/2021 in [facility] Office Visit from 05/22/2021 in [facility] Visit from 04/18/2021 in [place] [facility] Video Visit from 03/23/2021 in [facility]  PHQ-2 Total Score 2 2 0 0 0  PHQ-9 Total Score 4 11 2 2  --      Flowsheet Row Video Visit from 07/26/2021 in [facility] Video Visit from 05/25/2021 in [facility] Video Visit from 03/23/2021 in [place] No Risk No Risk No Risk        Assessment and Plan:   Alicia [name] is a 40 year old female with a past psychiatric history significant for bipolar disorder and generalized anxiety disorder who presents to [facility] via virtual telephone visit for follow-up and medication management.  Patient reports no issues or concerns regarding her current medication regimen.  She reports having depressive symptoms 2 weeks ago due to the anniversary of the loss of her husband.  Patient attributes her shift in mood to months that helped significantly to her.  Patient denies the need for dosage adjustments at this time.  Patient to continue taking medications as prescribed.   Patient's medications to be e-prescribed to pharmacy of choice.  1. Generalized anxiety disorder  - clonazePAM (KLONOPIN) 0.5 MG tablet; Take 1 tablet (0.5 mg total) by mouth 2 (two) times daily as needed for anxiety.  Dispense: 60 tablet; Refill: 1 - PARoxetine (PAXIL) 40 MG tablet; Take 1 tablet (40 mg total) by mouth daily.  Dispense: 30 tablet; Refill: 1  2. Bipolar depression ([place])  - PARoxetine (PAXIL) 40 MG tablet; Take 1 tablet (40 mg total) by mouth daily.  Dispense: 30 tablet; Refill: 1 - ARIPiprazole (ABILIFY) 5 MG tablet; Take 1 tablet (5 mg total) by mouth daily.  Dispense: 30 tablet; Refill: 1  Patient to follow in 2 month Provider spent a total of 16 minutes with the patient/reviewing the patient's chart  Malachy Mood, PA 07/26/2021, 3:49 PM

## 2021-07-27 ENCOUNTER — Encounter (HOSPITAL_COMMUNITY): Payer: Self-pay | Admitting: Physician Assistant

## 2021-07-31 ENCOUNTER — Other Ambulatory Visit (HOSPITAL_COMMUNITY)
Admission: RE | Admit: 2021-07-31 | Discharge: 2021-07-31 | Disposition: A | Discharge status: Home / Self Care | Payer: Medicaid Other | Source: Ambulatory Visit | Attending: Obstetrics and Gynecology | Admitting: Obstetrics and Gynecology

## 2021-07-31 ENCOUNTER — Encounter: Payer: Self-pay | Admitting: Obstetrics and Gynecology

## 2021-07-31 ENCOUNTER — Other Ambulatory Visit: Payer: Self-pay

## 2021-07-31 ENCOUNTER — Ambulatory Visit (INDEPENDENT_AMBULATORY_CARE_PROVIDER_SITE_OTHER): Payer: Medicaid Other | Admitting: Obstetrics and Gynecology

## 2021-07-31 VITALS — BP 104/65 | HR 80 | Ht 60.0 in | Wt 232.0 lb

## 2021-07-31 DIAGNOSIS — Z01419 Encounter for gynecological examination (general) (routine) without abnormal findings: Secondary | ICD-10-CM | POA: Insufficient documentation

## 2021-07-31 NOTE — Progress Notes (Signed)
Subjective:     Alicia [name] is a 40 y.o. female P3 with BMI 45 who is here for a comprehensive physical exam. The patient reports no problems. Patient reports a monthly period lasting 5-6 days. She is sexually active using POP for contraception. She has not been sexually active in several months. She denies urinary incontinence. She denies pelvic pain or abnormal discharge. Patient is without any other complaints. She admits to overeating during thanksgiving and is aware of her excessive weight gain since last year.   Past Medical History:  Diagnosis Date   Anxiety    Arthritis    Phreesia 03/08/2020   Bladder infection    Complication of anesthesia    Epidural did not work w/2nd preg. labor   Depression    No med. currently, Stopped with + UPT   Depression 06/27/2012   Gonorrhea    Hemorrhoids 06/27/2012   Osteoarthritis 06/27/2012   Osteoporosis    Phreesia 03/08/2020   Panic disorder 06/27/2012   Sleep apnea    [employer]reesia 03/08/2020   Past Surgical History:  Procedure Laterality Date   BREAST MASS EXCISION     Benign   BREAST SURGERY N/A    Phreesia 03/08/2020   CESAREAN SECTION     X 1 1st preg., then VBAC   CESAREAN SECTION N/A 01/27/2013   Procedure: CESAREAN SECTION;  Surgeon: Shelly Bombard, MD;  Location: [facility];  Service: Obstetrics;  Laterality: N/A;   MOUTH SURGERY     Family History  Problem Relation Age of Onset   Cancer Mother    Other Neg Hx    Alcohol abuse Neg Hx    Arthritis Neg Hx    Asthma Neg Hx    Birth defects Neg Hx    COPD Neg Hx    Depression Neg Hx    Drug abuse Neg Hx    Diabetes Neg Hx    Early death Neg Hx    Hearing loss Neg Hx    Heart disease Neg Hx    Hyperlipidemia Neg Hx    Hypertension Neg Hx    Kidney disease Neg Hx    Learning disabilities Neg Hx    Mental illness Neg Hx    Mental retardation Neg Hx    Miscarriages / Stillbirths Neg Hx    Stroke Neg Hx    Vision loss Neg Hx    Social History   Tobacco Use    Smoking status: Every Day    Packs/day: 0.25    Types: Cigarettes   Smokeless tobacco: Never  Vaping Use   Vaping Use: Never used  Substance Use Topics   Alcohol use: Yes    Comment: occ   Drug use: No    Social History   Socioeconomic History   Marital status: Widowed    Spouse name: Not on file   Number of children: Not on file   Years of education: Not on file   Highest education level: Not on file  Occupational History   Not on file  Tobacco Use   Smoking status: Every Day    Packs/day: 0.25    Types: Cigarettes   Smokeless tobacco: Never  Vaping Use   Vaping Use: Never used  Substance and Sexual Activity   Alcohol use: Yes    Comment: occ   Drug use: No   Sexual activity: Yes    Birth control/protection: None    Comment: Last intercourse at least 1 week ago  Other Topics Concern  Not on file  Social History Narrative   Not on file   Social Determinants of Health   Financial Resource Strain: Low Risk    Difficulty of Paying Living Expenses: Not hard at all  Food Insecurity: No Food Insecurity   Worried About Charity fundraiser in the Last Year: Never true   Arboriculturist in the Last Year: Never true  Transportation Needs: No Transportation Needs   Lack of Transportation (Medical): No   Lack of Transportation (Non-Medical): No  Physical Activity: Inactive   Days of Exercise per Week: 0 days   Minutes of Exercise per Session: 0 min  Stress: Stress Concern Present   Feeling of Stress : Rather much  Social Connections: Moderately Isolated   Frequency of Communication with Friends and Family: More than three times a week   Frequency of Social Gatherings with Friends and Family: Twice a week   Attends Religious Services: More than 4 times per year   Active Member of [employer] or Organizations: No   Attends Archivist Meetings: Never   Marital Status: Widowed  Human resources officer Violence: Not At Risk   Fear of Current or Ex-Partner: No    Emotionally Abused: No   Physically Abused: No   Sexually Abused: No   Health Maintenance  Topic Date Due   COVID-19 Vaccine (1) Never done   Pneumococcal Vaccine 59-50 Years old (1 - PCV) Never done   MAMMOGRAM  Never done   INFLUENZA VACCINE  Never done   TETANUS/TDAP  01/29/2023   PAP SMEAR-Modifier  07/29/2023   Hepatitis C Screening  Completed   HIV Screening  Completed   HPV VACCINES  Aged Out       Review of Systems Pertinent items noted in HPI and remainder of comprehensive ROS otherwise negative.   Objective:  Blood pressure 104/65, pulse 80, height 5' (1.524 m), weight 232 lb (105.2 kg), last menstrual period 07/19/2021.    GENERAL: Well-developed, well-nourished female in no acute distress.  HEENT: Normocephalic, atraumatic. Sclerae anicteric.  NECK: Supple. Normal thyroid.  LUNGS: Clear to auscultation bilaterally.  HEART: Regular rate and rhythm. BREASTS: Symmetric in size. No palpable masses or lymphadenopathy, skin changes, or nipple drainage. ABDOMEN: Soft, nontender, nondistended. No organomegaly. PELVIC: Normal external female genitalia. Vagina is pink and rugated.  Normal discharge. Normal appearing cervix. Uterus is normal in size. No adnexal mass or tenderness. Chaperone present during the pelvic exam EXTREMITIES: No cyanosis, clubbing, or edema, 2+ distal pulses.    Assessment:    Healthy female exam.      Plan:    Pap smear collected Screening mammogram ordered STI screening ordered per patient request Patient will be contacted with abnormal results Patient declined referral to nutritionist. She states she plans on modifying her eating habits and walking more See After Visit Summary for Counseling Recommendations

## 2021-07-31 NOTE — Progress Notes (Signed)
GYN/PAP/STD cultures.  Reports no problems today.

## 2021-08-01 LAB — HIV ANTIBODY (ROUTINE TESTING W REFLEX): HIV Screen 4th Generation wRfx: NONREACTIVE

## 2021-08-01 LAB — HEPATITIS C ANTIBODY: Hep C Virus Ab: 0.7 s/co ratio (ref 0.0–0.9)

## 2021-08-01 LAB — RPR: RPR Ser Ql: NONREACTIVE

## 2021-08-01 LAB — CERVICOVAGINAL ANCILLARY ONLY
Chlamydia: NEGATIVE
Comment: NEGATIVE
Comment: NORMAL
Neisseria Gonorrhea: NEGATIVE

## 2021-08-01 LAB — HEPATITIS B SURFACE ANTIGEN: Hepatitis B Surface Ag: NEGATIVE

## 2021-08-03 ENCOUNTER — Other Ambulatory Visit: Payer: Self-pay

## 2021-08-03 ENCOUNTER — Ambulatory Visit (HOSPITAL_BASED_OUTPATIENT_CLINIC_OR_DEPARTMENT_OTHER)
Admission: RE | Admit: 2021-08-03 | Discharge: 2021-08-03 | Disposition: A | Discharge status: Home / Self Care | Payer: Medicaid Other | Source: Ambulatory Visit | Attending: Obstetrics and Gynecology | Admitting: Obstetrics and Gynecology

## 2021-08-03 ENCOUNTER — Other Ambulatory Visit: Payer: Self-pay | Admitting: Physician Assistant

## 2021-08-03 DIAGNOSIS — R928 Other abnormal and inconclusive findings on diagnostic imaging of breast: Secondary | ICD-10-CM | POA: Diagnosis not present

## 2021-08-03 DIAGNOSIS — Z01419 Encounter for gynecological examination (general) (routine) without abnormal findings: Secondary | ICD-10-CM

## 2021-08-03 DIAGNOSIS — Z1231 Encounter for screening mammogram for malignant neoplasm of breast: Secondary | ICD-10-CM | POA: Insufficient documentation

## 2021-08-03 DIAGNOSIS — K219 Gastro-esophageal reflux disease without esophagitis: Secondary | ICD-10-CM

## 2021-08-03 LAB — CYTOLOGY - PAP
Comment: NEGATIVE
Diagnosis: NEGATIVE
High risk HPV: NEGATIVE

## 2021-08-07 ENCOUNTER — Other Ambulatory Visit: Payer: Self-pay | Admitting: Obstetrics and Gynecology

## 2021-08-07 DIAGNOSIS — R928 Other abnormal and inconclusive findings on diagnostic imaging of breast: Secondary | ICD-10-CM

## 2021-08-08 DIAGNOSIS — F3132 Bipolar disorder, current episode depressed, moderate: Secondary | ICD-10-CM | POA: Diagnosis not present

## 2021-08-14 ENCOUNTER — Other Ambulatory Visit: Payer: Self-pay | Admitting: Physician Assistant

## 2021-08-14 DIAGNOSIS — G8929 Other chronic pain: Secondary | ICD-10-CM

## 2021-08-17 ENCOUNTER — Other Ambulatory Visit: Payer: Self-pay | Admitting: Physician Assistant

## 2021-08-17 DIAGNOSIS — F411 Generalized anxiety disorder: Secondary | ICD-10-CM

## 2021-08-31 ENCOUNTER — Other Ambulatory Visit: Payer: Self-pay | Admitting: Physician Assistant

## 2021-08-31 DIAGNOSIS — G8929 Other chronic pain: Secondary | ICD-10-CM

## 2021-08-31 DIAGNOSIS — M5441 Lumbago with sciatica, right side: Secondary | ICD-10-CM

## 2021-09-07 ENCOUNTER — Other Ambulatory Visit: Payer: Self-pay

## 2021-09-07 ENCOUNTER — Other Ambulatory Visit: Payer: Self-pay | Admitting: Obstetrics and Gynecology

## 2021-09-07 ENCOUNTER — Ambulatory Visit
Admission: RE | Admit: 2021-09-07 | Discharge: 2021-09-07 | Disposition: A | Discharge status: Home / Self Care | Payer: Medicaid Other | Source: Ambulatory Visit | Attending: Obstetrics and Gynecology | Admitting: Obstetrics and Gynecology

## 2021-09-07 DIAGNOSIS — R928 Other abnormal and inconclusive findings on diagnostic imaging of breast: Secondary | ICD-10-CM

## 2021-09-07 DIAGNOSIS — N6320 Unspecified lump in the left breast, unspecified quadrant: Secondary | ICD-10-CM

## 2021-09-07 DIAGNOSIS — N6311 Unspecified lump in the right breast, upper outer quadrant: Secondary | ICD-10-CM | POA: Diagnosis not present

## 2021-09-07 DIAGNOSIS — N6310 Unspecified lump in the right breast, unspecified quadrant: Secondary | ICD-10-CM

## 2021-09-07 DIAGNOSIS — R922 Inconclusive mammogram: Secondary | ICD-10-CM | POA: Diagnosis not present

## 2021-09-27 ENCOUNTER — Telehealth (INDEPENDENT_AMBULATORY_CARE_PROVIDER_SITE_OTHER): Payer: Medicaid Other | Admitting: Physician Assistant

## 2021-09-27 ENCOUNTER — Encounter (HOSPITAL_COMMUNITY): Payer: Self-pay | Admitting: Physician Assistant

## 2021-09-27 DIAGNOSIS — F319 Bipolar disorder, unspecified: Secondary | ICD-10-CM

## 2021-09-27 DIAGNOSIS — F411 Generalized anxiety disorder: Secondary | ICD-10-CM | POA: Diagnosis not present

## 2021-09-27 MED ORDER — ARIPIPRAZOLE 5 MG PO TABS: 5.0000 mg | ORAL_TABLET | Freq: Every day | ORAL | 1 refills | Status: DC

## 2021-09-27 MED ORDER — CLONAZEPAM 0.5 MG PO TABS: 0.5000 mg | ORAL_TABLET | Freq: Two times a day (BID) | ORAL | 1 refills | Status: DC | PRN

## 2021-09-27 MED ORDER — PAROXETINE HCL 40 MG PO TABS: 40.0000 mg | ORAL_TABLET | Freq: Every day | ORAL | 1 refills | Status: DC

## 2021-09-27 NOTE — Progress Notes (Signed)
[facility] MD/PA/NP OP Progress Note  Virtual Visit via Telephone Note  I connected with Alicia [name] on 09/27/21 at  4:30 PM EST by telephone and verified that I am speaking with the correct person using two identifiers.  Location: Patient: Private location Provider: Home   I discussed the limitations, risks, security and privacy concerns of performing an evaluation and management service by telephone and the availability of in person appointments. I also discussed with the patient that there may be a patient responsible charge related to this service. The patient expressed understanding and agreed to proceed.  Follow Up Instructions:   I discussed the assessment and treatment plan with the patient. The patient was provided an opportunity to ask questions and all were answered. The patient agreed with the plan and demonstrated an understanding of the instructions.   The patient was advised to call back or seek an in-person evaluation if the symptoms worsen or if the condition fails to improve as anticipated.  I provided 11 minutes of non-face-to-face time during this encounter.  Malachy Mood, PA    09/27/2021 7:01 PM Alicia [name]  MRN:  [id number]  Chief Complaint: Follow up and medication management  HPI:   Alicia [name] is a 41 year old female with a past psychiatric history significant for bipolar disorder and generalized anxiety disorder who presents to [facility] via virtual telephone visit for follow-up and medication management.  Patient is currently being managed on the following medications:  Paroxetine 40 mg daily Abilify 5 mg daily Clonazepam 0.5 mg 2 times daily as needed  Patient reports no issues or concerns regarding her current medication regimen.  Patient denies the need for dosage adjustments at this time and is requesting refills on all her medications following the conclusion of the encounter.  Patient reports that  she maintains stability while taking her medications.  The only issue patient endorses today is her anxiety.  She attributes her anxiety to being mentally exhausted and not setting aside time for herself.  Patient rates her anxiety a 5 out of 10 and denies any depressive symptoms.  A GAD-7 screen was performed with the patient scoring a 12.  Patient is alert and oriented x4, calm, cooperative, and fully engaged in conversation during the encounter.  Patient endorses anxious mood.  Patient denies suicidal or homicidal ideations.  She further denies auditory or visual hallucinations and does not appear to be responding to internal/external stimuli.  Patient endorses good sleep and receives on average 7 hours of sleep each night.  Patient endorses good appetite and eats on average 2 meals per day.  Patient endorses alcohol consumption sparingly.  Patient endorses tobacco use and smokes on average 5 cigarettes/day.  Patient denies illicit drug use.  Visit Diagnosis:    ICD-10-CM   1. Generalized anxiety disorder  F41.1 clonazePAM (KLONOPIN) 0.5 MG tablet    PARoxetine (PAXIL) 40 MG tablet    2. Bipolar depression (HCC)  F31.9 PARoxetine (PAXIL) 40 MG tablet    ARIPiprazole (ABILIFY) 5 MG tablet    3. Vitamin D deficiency  E55.9       Past Psychiatric History:  Generalized anxiety disorder Bipolar disorder  Past Medical History:  Past Medical History:  Diagnosis Date   Anxiety    Arthritis    [employer]reesia 03/08/2020   Bladder infection    Complication of anesthesia    Epidural did not work w/2nd preg. labor   Depression    No med.  currently, Stopped with + UPT   Depression 06/27/2012   Gonorrhea    Hemorrhoids 06/27/2012   Osteoarthritis 06/27/2012   Osteoporosis    [employer]reesia 03/08/2020   Panic disorder 06/27/2012   Sleep apnea    [employer]reesia 03/08/2020    Past Surgical History:  Procedure Laterality Date   BREAST MASS EXCISION     Benign   BREAST SURGERY N/A    [employer]reesia 03/08/2020    CESAREAN SECTION     X 1 1st preg., then VBAC   CESAREAN SECTION N/A 01/27/2013   Procedure: CESAREAN SECTION;  Surgeon: Shelly Bombard, MD;  Location: [facility];  Service: Obstetrics;  Laterality: N/A;   MOUTH SURGERY      Family Psychiatric History:  Mother - Major depressive disorder  Family History:  Family History  Problem Relation Age of Onset   Cancer Mother    Other Neg Hx    Alcohol abuse Neg Hx    Arthritis Neg Hx    Asthma Neg Hx    Birth defects Neg Hx    COPD Neg Hx    Depression Neg Hx    Drug abuse Neg Hx    Diabetes Neg Hx    Early death Neg Hx    Hearing loss Neg Hx    Heart disease Neg Hx    Hyperlipidemia Neg Hx    Hypertension Neg Hx    Kidney disease Neg Hx    Learning disabilities Neg Hx    Mental illness Neg Hx    Mental retardation Neg Hx    Miscarriages / Stillbirths Neg Hx    Stroke Neg Hx    Vision loss Neg Hx     Social History:  Social History   Socioeconomic History   Marital status: Widowed    Spouse name: Not on file   Number of children: Not on file   Years of education: Not on file   Highest education level: Not on file  Occupational History   Not on file  Tobacco Use   Smoking status: Every Day    Packs/day: 0.25    Types: Cigarettes   Smokeless tobacco: Never  Vaping Use   Vaping Use: Never used  Substance and Sexual Activity   Alcohol use: Yes    Comment: occ   Drug use: No   Sexual activity: Yes    Birth control/protection: None    Comment: Last intercourse at least 1 week ago  Other Topics Concern   Not on file  Social History Narrative   Not on file   Social Determinants of Health   Financial Resource Strain: Low Risk    Difficulty of Paying Living Expenses: Not hard at all  Food Insecurity: No Food Insecurity   Worried About Charity fundraiser in the Last Year: Never true   Ran Out of Food in the Last Year: Never true  Transportation Needs: No Transportation Needs   Lack of Transportation (Medical):  No   Lack of Transportation (Non-Medical): No  Physical Activity: Inactive   Days of Exercise per Week: 0 days   Minutes of Exercise per Session: 0 min  Stress: Stress Concern Present   Feeling of Stress : Rather much  Social Connections: Moderately Isolated   Frequency of Communication with Friends and Family: More than three times a week   Frequency of Social Gatherings with Friends and Family: Twice a week   Attends Religious Services: More than 4 times per year   Active Member  of Clubs or Organizations: No   Attends Archivist Meetings: Never   Marital Status: Widowed    Allergies:  Allergies  Allergen Reactions   Penicillins     Metabolic Disorder Labs: No results found for: HGBA1C, MPG No results found for: PROLACTIN Lab Results  Component Value Date   CHOL 188 01/26/2020   TRIG 58 01/26/2020   HDL 47 01/26/2020   CHOLHDL 4.0 01/26/2020   LDLCALC 130 (H) 01/26/2020   Lab Results  Component Value Date   TSH 0.581 07/03/2021   TSH 1.350 01/26/2020    Therapeutic Level Labs: No results found for: LITHIUM No results found for: VALPROATE No components found for:  CBMZ  Current Medications: Current Outpatient Medications  Medication Sig Dispense Refill   ARIPiprazole (ABILIFY) 5 MG tablet Take 1 tablet (5 mg total) by mouth daily. 30 tablet 1   clonazePAM (KLONOPIN) 0.5 MG tablet Take 1 tablet (0.5 mg total) by mouth 2 (two) times daily as needed for anxiety. 60 tablet 1   cyclobenzaprine (FLEXERIL) 5 MG tablet Take 1-2 tablets (5-10 mg total) by mouth at bedtime. 30 tablet 1   Drospirenone (SLYND) 4 MG TABS Take 1 tablet by mouth daily. 84 tablet 4   hydrOXYzine (ATARAX/VISTARIL) 10 MG tablet Take 1 tablet (10 mg total) by mouth 3 (three) times daily as needed. 60 tablet 2   pantoprazole (PROTONIX) 40 MG tablet TAKE 1 TABLET BY MOUTH EVERY DAY 90 tablet 0   PARoxetine (PAXIL) 40 MG tablet Take 1 tablet (40 mg total) by mouth daily. 30 tablet 1   PROAIR  HFA 108 (90 Base) MCG/ACT inhaler INHALE 1-2 PUFFS BY MOUTH EVERY 6 HOURS AS NEEDED FOR WHEEZE OR SHORTNESS OF BREATH 8.5 each 1   Vitamin D, Ergocalciferol, (DRISDOL) 1.25 MG (50000 UNIT) CAPS capsule TAKE 1 CAPSULE (50,000 UNITS TOTAL) BY MOUTH EVERY 7 (SEVEN) DAYS 4 capsule 2   No current facility-administered medications for this visit.     Musculoskeletal: Strength & Muscle Tone: Unable to assess due to telemedicine visit [place]: Unable to assess due to telemedicine visit Patient leans: Unable to assess due to telemedicine visit  Psychiatric Specialty Exam: Review of Systems  Psychiatric/Behavioral:  Positive for sleep disturbance. Negative for decreased concentration, dysphoric mood, hallucinations, self-injury and suicidal ideas. The patient is nervous/anxious. The patient is not hyperactive.    There were no vitals taken for this visit.There is no height or weight on file to calculate BMI.  General Appearance: Unable to assess due to telemedicine visit  Eye Contact:  Unable to assess due to telemedicine visit  Speech:  Clear and Coherent and Normal Rate  Volume:  Normal  Mood:  Anxious and Euthymic  Affect:  Appropriate and Congruent  Thought Process:  Coherent and Descriptions of Associations: Intact  Orientation:  Full (Time, Place, and Person)  Thought Content: WDL   Suicidal Thoughts:  No  Homicidal Thoughts:  No  Memory:  Immediate;   Good Recent;   Good Remote;   Good  Judgement:  Good  Insight:  Good  Psychomotor Activity:  Normal  Concentration:  Concentration: Good and Attention Span: Good  Recall:  Good  Fund of Knowledge: Good  Language: Good  Akathisia:  NA  Handed:  Right  AIMS (if indicated): not done  Assets:  Communication Skills Desire for Improvement Housing Social Support  ADL's:  Intact  Cognition: WNL  Sleep:  Good   Screenings: GAD-7    Flowsheet Row  Video Visit from 09/27/2021 in [facility] Video  Visit from 07/26/2021 in [facility] Video Visit from 05/25/2021 in [facility] Office Visit from 04/18/2021 in [facility] Video Visit from 03/23/2021 in [facility]  Total GAD-7 Score 12 13 15 13 15       PHQ2-9    Flowsheet Row Video Visit from 09/27/2021 in [facility] Office Visit from 07/31/2021 in [place] Video Visit from 07/26/2021 in [facility] Video Visit from 05/25/2021 in [facility] Office Visit from 05/22/2021 in [facility] at [facility] Total Score 0 0 2 2 0  PHQ-9 Total Score -- 2 4 11 2       Flowsheet Row Video Visit from 09/27/2021 in [facility] Video Visit from 07/26/2021 in [facility] Video Visit from 05/25/2021 in [facility] No Risk No Risk No Risk        Assessment and Plan:   Alicia [name] is a 41 year old female with a past psychiatric history significant for bipolar disorder and generalized anxiety disorder who presents to [facility] via virtual telephone visit for follow-up and medication management.  Patient reports no issues or concerns regarding her current medication regimen.  Patient endorses some anxiety she attributes to being mentally exhausted and not setting aside enough time for herself.  Despite her anxiety, patient states that she is stable on her current medication regimen.  Patient's medications to be e-prescribed to pharmacy of choice.  1. Generalized anxiety disorder  - clonazePAM (KLONOPIN) 0.5 MG tablet; Take 1 tablet (0.5 mg total) by mouth 2 (two) times daily as needed for anxiety.  Dispense: 60 tablet; Refill: 1 - PARoxetine (PAXIL) 40 MG tablet; Take 1 tablet (40 mg total) by  mouth daily.  Dispense: 30 tablet; Refill: 1  2. Bipolar depression ([place])  - PARoxetine (PAXIL) 40 MG tablet; Take 1 tablet (40 mg total) by mouth daily.  Dispense: 30 tablet; Refill: 1 - ARIPiprazole (ABILIFY) 5 MG tablet; Take 1 tablet (5 mg total) by mouth daily.  Dispense: 30 tablet; Refill: 1  Patient to follow up in 2 months Provider spent a total of 11 minutes with the patient/reviewing patient's chart  Malachy Mood, PA 09/27/2021, 7:01 PM

## 2021-10-03 DIAGNOSIS — F3132 Bipolar disorder, current episode depressed, moderate: Secondary | ICD-10-CM | POA: Diagnosis not present

## 2021-10-10 ENCOUNTER — Other Ambulatory Visit: Payer: Self-pay | Admitting: Physician Assistant

## 2021-10-10 DIAGNOSIS — G8929 Other chronic pain: Secondary | ICD-10-CM

## 2021-10-28 ENCOUNTER — Encounter (HOSPITAL_COMMUNITY): Payer: Self-pay | Admitting: Obstetrics and Gynecology

## 2021-10-28 ENCOUNTER — Inpatient Hospital Stay (HOSPITAL_COMMUNITY)
Admission: AD | Admit: 2021-10-28 | Discharge: 2021-10-28 | Disposition: A | Discharge status: Home / Self Care | Payer: Medicaid Other | Attending: Obstetrics and Gynecology | Admitting: Obstetrics and Gynecology

## 2021-10-28 ENCOUNTER — Encounter: Payer: Self-pay | Admitting: Emergency Medicine

## 2021-10-28 ENCOUNTER — Ambulatory Visit
Admission: EM | Admit: 2021-10-28 | Discharge: 2021-10-28 | Disposition: A | Discharge status: Home / Self Care | Payer: Medicaid Other | Attending: Physician Assistant | Admitting: Physician Assistant

## 2021-10-28 ENCOUNTER — Other Ambulatory Visit: Payer: Self-pay

## 2021-10-28 ENCOUNTER — Inpatient Hospital Stay (HOSPITAL_COMMUNITY): Payer: Medicaid Other

## 2021-10-28 DIAGNOSIS — O09521 Supervision of elderly multigravida, first trimester: Secondary | ICD-10-CM | POA: Diagnosis not present

## 2021-10-28 DIAGNOSIS — Z3A01 Less than 8 weeks gestation of pregnancy: Secondary | ICD-10-CM | POA: Insufficient documentation

## 2021-10-28 DIAGNOSIS — Z6740 Type O blood, Rh positive: Secondary | ICD-10-CM | POA: Diagnosis not present

## 2021-10-28 DIAGNOSIS — D251 Intramural leiomyoma of uterus: Secondary | ICD-10-CM | POA: Insufficient documentation

## 2021-10-28 DIAGNOSIS — O208 Other hemorrhage in early pregnancy: Secondary | ICD-10-CM | POA: Diagnosis not present

## 2021-10-28 DIAGNOSIS — Z3491 Encounter for supervision of normal pregnancy, unspecified, first trimester: Secondary | ICD-10-CM | POA: Diagnosis not present

## 2021-10-28 DIAGNOSIS — O4690 Antepartum hemorrhage, unspecified, unspecified trimester: Secondary | ICD-10-CM | POA: Diagnosis not present

## 2021-10-28 DIAGNOSIS — O3411 Maternal care for benign tumor of corpus uteri, first trimester: Secondary | ICD-10-CM | POA: Insufficient documentation

## 2021-10-28 DIAGNOSIS — O418X10 Other specified disorders of amniotic fluid and membranes, first trimester, not applicable or unspecified: Secondary | ICD-10-CM

## 2021-10-28 DIAGNOSIS — O209 Hemorrhage in early pregnancy, unspecified: Secondary | ICD-10-CM | POA: Insufficient documentation

## 2021-10-28 LAB — WET PREP, GENITAL
Sperm: NONE SEEN
Trich, Wet Prep: NONE SEEN
WBC, Wet Prep HPF POC: <10 (ref <10)
Yeast Wet Prep HPF POC: NONE SEEN

## 2021-10-28 LAB — CBC
HCT: 41.2 % (ref 36.0–46.0)
Hemoglobin: 13.4 g/dL (ref 12.0–15.0)
MCH: 27.2 pg (ref 26.0–34.0)
MCHC: 32.5 g/dL (ref 30.0–36.0)
MCV: 83.6 fL (ref 80.0–100.0)
Platelets: 255 10*3/uL (ref 150–400)
RBC: 4.93 MIL/uL (ref 3.87–5.11)
RDW: 13.9 % (ref 11.5–15.5)
WBC: 9.6 10*3/uL (ref 4.0–10.5)
nRBC: 0 % (ref 0.0–0.2)

## 2021-10-28 LAB — URINALYSIS, ROUTINE W REFLEX MICROSCOPIC
Bilirubin Urine: NEGATIVE
Glucose, UA: NEGATIVE mg/dL
Ketones, ur: NEGATIVE mg/dL
Leukocytes,Ua: NEGATIVE
Nitrite: NEGATIVE
Protein, ur: NEGATIVE mg/dL
Specific Gravity, Urine: 1.008 (ref 1.005–1.030)
pH: 6 (ref 5.0–8.0)

## 2021-10-28 LAB — HCG, QUANTITATIVE, PREGNANCY: hCG, Beta Chain, Quant, S: 10093 m[IU]/mL — ABNORMAL HIGH (ref <5)

## 2021-10-28 LAB — POCT URINE PREGNANCY: Preg Test, Ur: POSITIVE — AB

## 2021-10-28 NOTE — Discharge Instructions (Signed)

## 2021-10-28 NOTE — ED Provider Notes (Signed)
Given confirmed pregnancy with vaginal bleeding recommend further evaluation in the ED.  ?  ?Francene Finders, PA-C ?10/28/21 1544 ? ?

## 2021-10-28 NOTE — MAU Provider Note (Signed)
History     CSN: [id number]  Arrival date and time: 10/28/21 1637   Event Date/Time   First Provider Initiated Contact with Patient 10/28/21 1711      Chief Complaint  Patient presents with   Vaginal Bleeding   HPI Alicia [name] is a 41 y.o. [id number] at approximately 6 weeks who presents with vaginal bleeding. She reports 2 days of spotting when she wipes. She states this happened in her last pregnancy and she was diagnosed with a subchorionic hemorrhage. She denies any pain. She reports a hx of fibroids that she knows cause pain in pregnancy but denies any today. She denies any abnormal discharge.   OB History     Gravida  4   Para  3   Term  2   Preterm  1   AB      Living  3      SAB      IAB      Ectopic      Multiple      Live Births  1           Past Medical History:  Diagnosis Date   Anxiety    Arthritis    [employer]reesia 03/08/2020   Bladder infection    Complication of anesthesia    Epidural did not work w/2nd preg. labor   Depression    No med. currently, Stopped with + UPT   Depression 06/27/2012   Gonorrhea    Hemorrhoids 06/27/2012   Osteoarthritis 06/27/2012   Osteoporosis    [employer]reesia 03/08/2020   Panic disorder 06/27/2012   Sleep apnea    [employer]reesia 03/08/2020    Past Surgical History:  Procedure Laterality Date   BREAST MASS EXCISION     Benign   BREAST SURGERY N/A    [employer]reesia 03/08/2020   CESAREAN SECTION     X 1 1st preg., then VBAC   CESAREAN SECTION N/A 01/27/2013   Procedure: CESAREAN SECTION;  Surgeon: Shelly Bombard, MD;  Location: [facility];  Service: Obstetrics;  Laterality: N/A;   MOUTH SURGERY      Family History  Problem Relation Age of Onset   Cancer Mother    Other Neg Hx    Alcohol abuse Neg Hx    Arthritis Neg Hx    Asthma Neg Hx    Birth defects Neg Hx    COPD Neg Hx    Depression Neg Hx    Drug abuse Neg Hx    Diabetes Neg Hx    Early death Neg Hx    Hearing loss Neg Hx    Heart disease Neg Hx     Hyperlipidemia Neg Hx    Hypertension Neg Hx    Kidney disease Neg Hx    Learning disabilities Neg Hx    Mental illness Neg Hx    Mental retardation Neg Hx    Miscarriages / Stillbirths Neg Hx    Stroke Neg Hx    Vision loss Neg Hx     Social History   Tobacco Use   Smoking status: Every Day    Packs/day: 0.25    Types: Cigarettes   Smokeless tobacco: Never  Vaping Use   Vaping Use: Never used  Substance Use Topics   Alcohol use: Yes    Comment: occ   Drug use: No    Allergies:  Allergies  Allergen Reactions   Penicillins     Medications Prior to Admission  Medication Sig Dispense Refill Last  Dose   ARIPiprazole (ABILIFY) 5 MG tablet Take 1 tablet (5 mg total) by mouth daily. 30 tablet 1    clonazePAM (KLONOPIN) 0.5 MG tablet Take 1 tablet (0.5 mg total) by mouth 2 (two) times daily as needed for anxiety. 60 tablet 1    cyclobenzaprine (FLEXERIL) 5 MG tablet Take 1-2 tablets (5-10 mg total) by mouth at bedtime. 30 tablet 1    Drospirenone (SLYND) 4 MG TABS Take 1 tablet by mouth daily. 84 tablet 4    hydrOXYzine (ATARAX/VISTARIL) 10 MG tablet Take 1 tablet (10 mg total) by mouth 3 (three) times daily as needed. 60 tablet 2    pantoprazole (PROTONIX) 40 MG tablet TAKE 1 TABLET BY MOUTH EVERY DAY 90 tablet 0    PARoxetine (PAXIL) 40 MG tablet Take 1 tablet (40 mg total) by mouth daily. 30 tablet 1    PROAIR HFA 108 (90 Base) MCG/ACT inhaler INHALE 1-2 PUFFS BY MOUTH EVERY 6 HOURS AS NEEDED FOR WHEEZE OR SHORTNESS OF BREATH 8.5 each 1    Vitamin D, Ergocalciferol, (DRISDOL) 1.25 MG (50000 UNIT) CAPS capsule TAKE 1 CAPSULE (50,000 UNITS TOTAL) BY MOUTH EVERY 7 (SEVEN) DAYS 4 capsule 2     Review of Systems  Constitutional: Negative.  Negative for fatigue and fever.  HENT: Negative.    Respiratory: Negative.  Negative for shortness of breath.   Cardiovascular: Negative.  Negative for chest pain.  Gastrointestinal: Negative.  Negative for abdominal pain, constipation,  diarrhea, nausea and vomiting.  Genitourinary:  Positive for vaginal bleeding. Negative for dysuria.  Neurological: Negative.  Negative for dizziness and headaches.  Physical Exam   Blood pressure (!) 137/44, pulse 72, temperature 98.1 F (36.7 C), temperature source Oral, resp. rate 16, SpO2 99 %.  Physical Exam Vitals and nursing note reviewed.  Constitutional:      General: She is not in acute distress.    Appearance: She is well-developed.  HENT:     Head: Normocephalic.  Eyes:     Pupils: Pupils are equal, round, and reactive to light.  Cardiovascular:     Rate and Rhythm: Normal rate and regular rhythm.     Heart sounds: Normal heart sounds.  Pulmonary:     Effort: Pulmonary effort is normal. No respiratory distress.     Breath sounds: Normal breath sounds.  Abdominal:     General: Bowel sounds are normal. There is no distension.     Palpations: Abdomen is soft.     Tenderness: There is no abdominal tenderness.  Skin:    General: Skin is warm and dry.  Neurological:     Mental Status: She is alert and oriented to person, place, and time.  Psychiatric:        Mood and Affect: Mood normal.        Behavior: Behavior normal.        Thought Content: Thought content normal.        Judgment: Judgment normal.    MAU Course  Procedures Results for orders placed or performed during the hospital encounter of 10/28/21 (from the past 24 hour(s))  CBC     Status: None   Collection Time: 10/28/21  4:59 PM  Result Value Ref Range   WBC 9.6 4.0 - 10.5 K/uL   RBC 4.93 3.87 - 5.11 MIL/uL   Hemoglobin 13.4 12.0 - 15.0 g/dL   HCT 41.2 36.0 - 46.0 %   MCV 83.6 80.0 - 100.0 fL   MCH 27.2 26.0 - 34.0  pg   MCHC 32.5 30.0 - 36.0 g/dL   RDW 13.9 11.5 - 15.5 %   Platelets 255 150 - 400 K/uL   nRBC 0.0 0.0 - 0.2 %  hCG, quantitative, pregnancy     Status: Abnormal   Collection Time: 10/28/21  4:59 PM  Result Value Ref Range   hCG, Beta Chain, Quant, S 10,093 (H) <5 mIU/mL  Wet  prep, genital     Status: Abnormal   Collection Time: 10/28/21  5:07 PM   Specimen: Vaginal  Result Value Ref Range   Yeast Wet Prep HPF POC NONE SEEN NONE SEEN   Trich, Wet Prep NONE SEEN NONE SEEN   Clue Cells Wet Prep HPF POC PRESENT (A) NONE SEEN   WBC, Wet Prep HPF POC <10 <10   Sperm NONE SEEN   Urinalysis, Routine w reflex microscopic     Status: Abnormal   Collection Time: 10/28/21  5:39 PM  Result Value Ref Range   Color, Urine STRAW (A) YELLOW   APPearance CLEAR CLEAR   Specific Gravity, Urine 1.008 1.005 - 1.030   pH 6.0 5.0 - 8.0   Glucose, UA NEGATIVE NEGATIVE mg/dL   Hgb urine dipstick SMALL (A) NEGATIVE   Bilirubin Urine NEGATIVE NEGATIVE   Ketones, ur NEGATIVE NEGATIVE mg/dL   Protein, ur NEGATIVE NEGATIVE mg/dL   Nitrite NEGATIVE NEGATIVE   Leukocytes,Ua NEGATIVE NEGATIVE   RBC / HPF 0-5 0 - 5 RBC/hpf   WBC, UA 0-5 0 - 5 WBC/hpf   Bacteria, UA RARE (A) NONE SEEN   Squamous Epithelial / LPF 0-5 0 - 5   Mucus PRESENT    US OB Comp Less 14 Wks  Result Date: 10/28/2021 CLINICAL DATA:  Vaginal bleeding during pregnancy. EXAM: OBSTETRIC <14 WK ULTRASOUND TECHNIQUE: Transabdominal ultrasound was performed for evaluation of the gestation as well as the maternal uterus and adnexal regions. COMPARISON:  None. FINDINGS: Intrauterine gestational sac: Single Yolk sac:  Visualized. Embryo:  Not Visualized. MSD:  12.3 cm mm   6 w   0 d Subchorionic hemorrhage:  Small. Maternal uterus/adnexae: Bilateral ovaries are within normal limits. 2 fibroids are identified. Intramural fibroid in the right body measures 6.2 x 6.8 x 5.7 cm. Intramural fibroid posteriorly measures 2.3 x 1.9 x 1.4 cm. IMPRESSION: 1. Single intrauterine gestational sac measuring 6 weeks 0 days by mean gestational sac diameter. Yolk sac present. No fetal pole identified at this time which is within normal limits for a sac of this size. Recommend follow-up quantitative B-HCG levels and follow-up US in 14 days to assess  viability. This recommendation follows SRU consensus guidelines: Diagnostic Criteria for Nonviable Pregnancy Early in the First Trimester. [place] Med 2013; 952:8413-24. 2. Small subchorionic hemorrhage. 3. Uterine fibroids measuring up to 6.8 cm. Electronically Signed   By: Ronney Asters M.D.   On: 10/28/2021 18:01     MDM UA, UPT CBC, HCG ABO/Rh- O Pos  Wet prep and gc/chlamydia US OB Comp Less 14 weeks with Transvaginal  Reviewed results of subchorionic hemorrhage with patient. Discussed that this is a common finding in the first trimester and does not usually cause problems in the pregnancy like loss or difficulty with development. Reviewed expectations for vaginal bleeding including a small amount possibly for several weeks. Reviewed warning signs of heavy bleeding, saturating a pad in less than an hour, and severe pain as reasons to come back to MAU. Encouraged patient to exercise pelvic rest until 7 days after bleeding  stops. Patient verbalized understanding.    Assessment and Plan   1. Normal intrauterine pregnancy on prenatal ultrasound in first trimester   2. Vaginal bleeding affecting early pregnancy   3. [redacted] weeks gestation of pregnancy   4. Subchorionic hemorrhage of placenta in first trimester, single or unspecified fetus    -Discharge home in stable condition -First trimester precautions discussed -Patient advised to follow-up with [facility] in 10-14 days for repeat ultrasound, order placed -Patient may return to MAU as needed or if her condition were to change or worsen   Wende Mott CNM 10/28/2021, 5:11 PM

## 2021-10-28 NOTE — MAU Note (Addendum)
Pt reports to mau after being seen at urgent care for spotting for the past 2 months. Endorses intercourse last night.  Pt had pos preg test at home and urgent care.  States urgent care sent her to [facility] to be evaluated.  Reports irreg periods and unsure of LMP. Denies abd pain.   ?

## 2021-10-28 NOTE — ED Notes (Signed)
Advised from provider to go to the emergency dept.  ?

## 2021-10-28 NOTE — ED Triage Notes (Addendum)
Pt said last month was spotting a lot, this this month about you have begin to spot again very light and took a test this AM and positive pregnancy test. Pt said this happened to her with her last pregnancy with her spotting off and on at the beginning of her pregnancy. No abdominal pain or discomfort.  ?

## 2021-10-30 LAB — GC/CHLAMYDIA PROBE AMP (~~LOC~~) NOT AT ARMC
Chlamydia: NEGATIVE
Comment: NEGATIVE
Comment: NORMAL
Neisseria Gonorrhea: NEGATIVE

## 2021-11-06 DIAGNOSIS — F3132 Bipolar disorder, current episode depressed, moderate: Secondary | ICD-10-CM | POA: Diagnosis not present

## 2021-11-07 ENCOUNTER — Other Ambulatory Visit: Payer: Medicaid Other

## 2021-11-08 ENCOUNTER — Other Ambulatory Visit: Payer: Self-pay

## 2021-11-08 ENCOUNTER — Ambulatory Visit
Admission: RE | Admit: 2021-11-08 | Discharge: 2021-11-08 | Disposition: A | Discharge status: Home / Self Care | Payer: Medicaid Other | Source: Ambulatory Visit

## 2021-11-08 ENCOUNTER — Ambulatory Visit (INDEPENDENT_AMBULATORY_CARE_PROVIDER_SITE_OTHER): Payer: Medicaid Other | Admitting: Obstetrics and Gynecology

## 2021-11-08 VITALS — BP 116/52 | HR 82 | Ht 60.0 in | Wt 232.0 lb

## 2021-11-08 DIAGNOSIS — O039 Complete or unspecified spontaneous abortion without complication: Secondary | ICD-10-CM | POA: Diagnosis not present

## 2021-11-08 DIAGNOSIS — Z3689 Encounter for other specified antenatal screening: Secondary | ICD-10-CM | POA: Diagnosis not present

## 2021-11-08 DIAGNOSIS — Z5189 Encounter for other specified aftercare: Secondary | ICD-10-CM | POA: Diagnosis not present

## 2021-11-08 DIAGNOSIS — O3680X0 Pregnancy with inconclusive fetal viability, not applicable or unspecified: Secondary | ICD-10-CM | POA: Diagnosis not present

## 2021-11-08 DIAGNOSIS — Z3491 Encounter for supervision of normal pregnancy, unspecified, first trimester: Secondary | ICD-10-CM | POA: Insufficient documentation

## 2021-11-08 DIAGNOSIS — O3410 Maternal care for benign tumor of corpus uteri, unspecified trimester: Secondary | ICD-10-CM | POA: Diagnosis not present

## 2021-11-08 DIAGNOSIS — D259 Leiomyoma of uterus, unspecified: Secondary | ICD-10-CM | POA: Diagnosis not present

## 2021-11-08 DIAGNOSIS — O418X10 Other specified disorders of amniotic fluid and membranes, first trimester, not applicable or unspecified: Secondary | ICD-10-CM | POA: Insufficient documentation

## 2021-11-08 DIAGNOSIS — Z3A01 Less than 8 weeks gestation of pregnancy: Secondary | ICD-10-CM | POA: Insufficient documentation

## 2021-11-08 DIAGNOSIS — O468X1 Other antepartum hemorrhage, first trimester: Secondary | ICD-10-CM | POA: Insufficient documentation

## 2021-11-08 DIAGNOSIS — Z3A00 Weeks of gestation of pregnancy not specified: Secondary | ICD-10-CM | POA: Diagnosis not present

## 2021-11-08 NOTE — Progress Notes (Signed)
Obstetrics and Gynecology Visit ?Return Patient Evaluation ? ?Appointment Date: 11/08/2021 ? ?Primary Care Provider: Dorna Mai ? ?OBGYN Clinic: [facility] ? ?Chief Complaint: add on visit for ultrasound findings today ? ?History of Present Illness:  Alicia [name] is a 41 y.o. seen for above CC. Patient had u/s follow up after 3/4 MAU visit for VB and had an IUP with a YS and a GS measuring 6/0 weeks. She sates she had increase VB after leaving the MAU and u/s today shows no more IUP and known fibroid still present (6cm, intramural). Pt currently only needing one pad per day. She is O POS ? ? ?Review of Systems: as noted in the History of Present Illness. ? ? ?Patient Active Problem List  ? Diagnosis Date Noted  ? Lightheadedness 07/03/2021  ? Chronic midline low back pain with bilateral sciatica 04/19/2021  ? Class 3 severe obesity due to excess calories without serious comorbidity with body mass index (BMI) of 40.0 to 44.9 in adult [facility]) 04/19/2021  ? Vitamin D deficiency 01/04/2021  ? Abnormal liver enzymes 01/04/2021  ? OSA (obstructive sleep apnea) 06/21/2020  ? Generalized anxiety disorder 04/26/2020  ? Bipolar depression ([place]) 04/26/2020  ? GERD without esophagitis 01/22/2013  ? ?Medications:  ?[place] E. Nurse had no medications administered during this visit. ?Current Outpatient Medications  ?Medication Sig Dispense Refill  ? calcium carbonate (TUMS - DOSED IN MG ELEMENTAL CALCIUM) 500 MG chewable tablet Chew 1 tablet by mouth daily.    ? clonazePAM (KLONOPIN) 0.5 MG tablet Take 1 tablet (0.5 mg total) by mouth 2 (two) times daily as needed for anxiety. 60 tablet 1  ? PROAIR HFA 108 (90 Base) MCG/ACT inhaler INHALE 1-2 PUFFS BY MOUTH EVERY 6 HOURS AS NEEDED FOR WHEEZE OR SHORTNESS OF BREATH 8.5 each 1  ? Vitamin D, Ergocalciferol, (DRISDOL) 1.25 MG (50000 UNIT) CAPS capsule TAKE 1 CAPSULE (50,000 UNITS TOTAL) BY MOUTH EVERY 7 (SEVEN) DAYS 4 capsule 2  ?  ARIPiprazole (ABILIFY) 5 MG tablet Take 1 tablet (5 mg total) by mouth daily. (Patient not taking: Reported on 11/08/2021) 30 tablet 1  ? pantoprazole (PROTONIX) 40 MG tablet TAKE 1 TABLET BY MOUTH EVERY DAY (Patient not taking: Reported on 11/08/2021) 90 tablet 0  ? PARoxetine (PAXIL) 40 MG tablet Take 1 tablet (40 mg total) by mouth daily. (Patient not taking: Reported on 11/08/2021) 30 tablet 1  ? ?No current facility-administered medications for this visit.  ? ? ?Allergies: is allergic to penicillins. ? ?Physical Exam:  ?BP (!) 116/52   Pulse 82   Ht 5' (1.524 m)   Wt 232 lb (105.2 kg)   LMP  (LMP Unknown)   BMI 45.31 kg/m?  Body mass index is 45.31 kg/m?. ?General appearance: Well nourished, well developed female in no acute distress.  ?Abdomen: diffusely non tender to palpation ?Neuro/Psych:  Normal mood and affect.   ? ? ?Radiology: ?Narrative & Impression  ?CLINICAL DATA:  Evaluate fetal viability ?  ?EXAM: ?TRANSVAGINAL OB ULTRASOUND ?  ?TECHNIQUE: ?Transvaginal ultrasound was performed for complete evaluation of the ?gestation as well as the maternal uterus, adnexal regions, and ?pelvic cul-de-sac. ?  ?COMPARISON:  None. ?  ?FINDINGS: ?Intrauterine gestational sac: None ?  ?Yolk sac:  Not seen ?  ?Embryo:  Not seen ?  ?Cardiac Activity: Not seen ?  ?Subchorionic hemorrhage:  None visualized. ?  ?Maternal uterus/adnexae: There is inhomogeneous echogenicity in the ?myometrium. There is possible 6.1 x 4.5 cm fibroid.  Right ovary is ?unremarkable. Left ovary is not sonographically visualized. ?  ?IMPRESSION: ?There is no demonstrable gestational sac within the uterus. If ?pregnancy test is positive, differential diagnostic possibilities ?would include very early IUP or failed gestation with complete ?abortion or ectopic gestation. Serial HCG estimations and follow-up ?sonogram as clinically warranted should be considered. ?  ?There is inhomogeneous echogenicity in myometrium with fibroid. Left ?ovary is not  sonographically visualized. ?  ?  ?Electronically Signed ?  By: Elmer Picker M.D. ?  On: 11/08/2021 11:12  ? ?Assessment: pt stable ? ?Plan: ? ?1. Follow-up visit after miscarriage ?Images reviewed and condolescences given. I told her most likely reason is due to [facility]. Pt unsure if she wants to try again; she was not currently on birth control and had qmonth periods prior to this most recent pregnancy. Patient states she stopped her psych meds when she found she was pregnant. I told her that I recommend talking to her psych provider (pt has f/u in early April) about restarting as Paxil isn't recommended in pregnancy due to increased risk of heart defects ? ? ?RTC: 2wk f/u at [facility] ? ?Durene Romans MD ?Attending ?[facility] [employer] [facility]) ? ? ?

## 2021-11-20 ENCOUNTER — Other Ambulatory Visit: Payer: Self-pay

## 2021-11-20 ENCOUNTER — Ambulatory Visit: Payer: Medicaid Other | Admitting: Obstetrics and Gynecology

## 2021-11-20 ENCOUNTER — Encounter: Payer: Self-pay | Admitting: Obstetrics and Gynecology

## 2021-11-20 VITALS — BP 105/70 | HR 83 | Ht 60.0 in | Wt 234.0 lb

## 2021-11-20 DIAGNOSIS — O039 Complete or unspecified spontaneous abortion without complication: Secondary | ICD-10-CM

## 2021-11-20 DIAGNOSIS — F3132 Bipolar disorder, current episode depressed, moderate: Secondary | ICD-10-CM | POA: Diagnosis not present

## 2021-11-20 NOTE — Progress Notes (Signed)
41 yo P3 here for follow up on recent spontaneous miscarriage. Patient reports vaginal bleeding stopped on 11/11/21. She denies pelvic pain or abnormal discharge. Patient is still undecided as to whether she desires to conceive again. Patient is without any complaints ? ?Past Medical History:  ?Diagnosis Date  ? Anxiety   ? Arthritis   ? Phreesia 03/08/2020  ? Bladder infection   ? Complication of anesthesia   ? Epidural did not work w/2nd preg. labor  ? Depression   ? No med. currently, Stopped with + UPT  ? Depression 06/27/2012  ? Gonorrhea   ? Hemorrhoids 06/27/2012  ? Osteoarthritis 06/27/2012  ? Osteoporosis   ? Phreesia 03/08/2020  ? Panic disorder 06/27/2012  ? Sleep apnea   ? Phreesia 03/08/2020  ? ?Past Surgical History:  ?Procedure Laterality Date  ? BREAST MASS EXCISION    ? Benign  ? BREAST SURGERY N/A   ? Phreesia 03/08/2020  ? CESAREAN SECTION    ? X 1 1st preg., then VBAC  ? CESAREAN SECTION N/A 01/27/2013  ? Procedure: CESAREAN SECTION;  Surgeon: Shelly Bombard, MD;  Location: [facility];  Service: Obstetrics;  Laterality: N/A;  ? MOUTH SURGERY    ? ?Family History  ?Problem Relation Age of Onset  ? Cancer Mother   ? Other Neg Hx   ? Alcohol abuse Neg Hx   ? Arthritis Neg Hx   ? Asthma Neg Hx   ? Birth defects Neg Hx   ? COPD Neg Hx   ? Depression Neg Hx   ? Drug abuse Neg Hx   ? Diabetes Neg Hx   ? Early death Neg Hx   ? Hearing loss Neg Hx   ? Heart disease Neg Hx   ? Hyperlipidemia Neg Hx   ? Hypertension Neg Hx   ? Kidney disease Neg Hx   ? Learning disabilities Neg Hx   ? Mental illness Neg Hx   ? Mental retardation Neg Hx   ? Miscarriages / Stillbirths Neg Hx   ? Stroke Neg Hx   ? Vision loss Neg Hx   ? ?Social History  ? ?Tobacco Use  ? Smoking status: Every Day  ?  Packs/day: 0.25  ?  Types: Cigarettes  ? Smokeless tobacco: Never  ?Vaping Use  ? Vaping Use: Never used  ?Substance Use Topics  ? Alcohol use: Yes  ?  Comment: occ  ? Drug use: No  ? ?ROS ?See pertinent in HPI. All other systems  reviewed and non contributory ?Blood pressure 105/70, pulse 83, height 5' (1.524 m), weight 234 lb (106.1 kg), unknown if currently breastfeeding. ?GENERAL: Well-developed, well-nourished female in no acute distress.  ?NEURO: alert and oriented x 3 ? ?A/P 41 yo with recent spontaneous miscarriage ?- Advised patient to start taking prenatal vitamins ?- Patient scheduled to see psychiatrist in April ?- TSH and A1c today ?- Patient will be contacted with abnormal results ? ?

## 2021-11-20 NOTE — Progress Notes (Signed)
41 y.o. GYN presents for SAB FU.  Reports no concerns today. ?

## 2021-11-21 LAB — HEMOGLOBIN A1C
Est. average glucose Bld gHb Est-mCnc: 111 mg/dL
Hgb A1c MFr Bld: 5.5 % (ref 4.8–5.6)

## 2021-11-21 LAB — TSH: TSH: 0.582 u[IU]/mL (ref 0.450–4.500)

## 2021-11-28 ENCOUNTER — Telehealth (INDEPENDENT_AMBULATORY_CARE_PROVIDER_SITE_OTHER): Payer: Medicaid Other | Admitting: Physician Assistant

## 2021-11-28 ENCOUNTER — Encounter (HOSPITAL_COMMUNITY): Payer: Self-pay | Admitting: Physician Assistant

## 2021-11-28 DIAGNOSIS — F319 Bipolar disorder, unspecified: Secondary | ICD-10-CM | POA: Diagnosis not present

## 2021-11-28 DIAGNOSIS — F411 Generalized anxiety disorder: Secondary | ICD-10-CM

## 2021-11-28 MED ORDER — ARIPIPRAZOLE 5 MG PO TABS: 5.0000 mg | ORAL_TABLET | Freq: Every day | ORAL | 1 refills | Status: DC

## 2021-11-28 MED ORDER — CLONAZEPAM 0.5 MG PO TABS: 0.5000 mg | ORAL_TABLET | Freq: Two times a day (BID) | ORAL | 1 refills | Status: DC | PRN

## 2021-11-28 MED ORDER — PAROXETINE HCL 40 MG PO TABS: 40.0000 mg | ORAL_TABLET | Freq: Every day | ORAL | 1 refills | Status: DC

## 2021-11-28 NOTE — Progress Notes (Addendum)
BH MD/PA/NP OP Progress Note ? ?Virtual Visit via Telephone Note ? ?I connected with Alicia [name] on 11/28/21 at  4:30 PM EDT by telephone and verified that I am speaking with the correct person using two identifiers. ? ?Location: ?Patient: Home ?Provider: Clinic ?  ?I discussed the limitations, risks, security and privacy concerns of performing an evaluation and management service by telephone and the availability of in person appointments. I also discussed with the patient that there may be a patient responsible charge related to this service. The patient expressed understanding and agreed to proceed. ? ?Follow Up Instructions: ?  ?I discussed the assessment and treatment plan with the patient. The patient was provided an opportunity to ask questions and all were answered. The patient agreed with the plan and demonstrated an understanding of the instructions. ?  ?The patient was advised to call back or seek an in-person evaluation if the symptoms worsen or if the condition fails to improve as anticipated. ? ?I provided 9 minutes of non-face-to-face time during this encounter. ? ?Malachy Mood, PA ? ? ?11/28/2021 9:13 PM ?Alicia [name]  ?MRN:  [id number] ? ?Chief Complaint:  ?Chief Complaint  ?Patient presents with  ? Follow-up  ? ?HPI: ? ?Alicia [name] is a 41 year old female with a past psychiatric history significant for generalized anxiety disorder and bipolar depression who presents to [facility] via virtual telephone visit for follow-up and medication management.  Patient is currently being managed on the following medications: ? ?Paroxetine 40 mg daily ?Abilify 5 mg daily ?Clonazepam 0.5 mg 3 times daily as needed ? ?Patient reports being anxious and really stressed.  Patient reports that she recently had a miscarriage sometime in March and states that the process ended around the 18th.  Patient denies any major issues regarding her health.  Patient rates her  anxiety a 5 out of 10.  Patient denies any new stressors but states that her job has been stressful as of late.  Patient denies experiencing any depressive symptoms.  A GAD-7 screen was performed with the patient scoring a 16. ? ?Patient is alert and oriented x4, calm, cooperative, and fully engaged in conversation during the encounter.  Patient endorses aggravated mood related to stress at work.  Patient denies suicidal or homicidal ideations.  She further denies auditory or visual hallucinations and does not appear to be responding to internal/external stimuli.  Patient endorses good sleep and receives on average 6 to 7 hours of sleep each night.  Patient endorses good appetite and eats on average 2 meals per day.  Patient endorses alcohol consumption sparingly.  Patient endorses tobacco use and smokes on average 3 to 4 cigarettes/day.  Patient denies illicit drug use. ? ? ?Visit Diagnosis:  ?  ICD-10-CM   ?1. Generalized anxiety disorder  F41.1 PARoxetine (PAXIL) 40 MG tablet  ?  clonazePAM (KLONOPIN) 0.5 MG tablet  ?  ?2. Bipolar depression (HCC)  F31.9 PARoxetine (PAXIL) 40 MG tablet  ?  ARIPiprazole (ABILIFY) 5 MG tablet  ?  ? ? ?Past Psychiatric History:  ?Generalized anxiety disorder ?Bipolar disorder ? ?Past Medical History:  ?Past Medical History:  ?Diagnosis Date  ? Anxiety   ? Arthritis   ? Phreesia 03/08/2020  ? Bladder infection   ? Complication of anesthesia   ? Epidural did not work w/2nd preg. labor  ? Depression   ? No med. currently, Stopped with + UPT  ? Depression 06/27/2012  ? Gonorrhea   ?  Hemorrhoids 06/27/2012  ? Osteoarthritis 06/27/2012  ? Osteoporosis   ? Phreesia 03/08/2020  ? Panic disorder 06/27/2012  ? Sleep apnea   ? Phreesia 03/08/2020  ?  ?Past Surgical History:  ?Procedure Laterality Date  ? BREAST MASS EXCISION    ? Benign  ? BREAST SURGERY N/A   ? Phreesia 03/08/2020  ? CESAREAN SECTION    ? X 1 1st preg., then VBAC  ? CESAREAN SECTION N/A 01/27/2013  ? Procedure: CESAREAN  SECTION;  Surgeon: Shelly Bombard, MD;  Location: [facility];  Service: Obstetrics;  Laterality: N/A;  ? MOUTH SURGERY    ? ? ?Family Psychiatric History:  ?Mother - Major depressive disorder ? ?Family History:  ?Family History  ?Problem Relation Age of Onset  ? Cancer Mother   ? Other Neg Hx   ? Alcohol abuse Neg Hx   ? Arthritis Neg Hx   ? Asthma Neg Hx   ? Birth defects Neg Hx   ? COPD Neg Hx   ? Depression Neg Hx   ? Drug abuse Neg Hx   ? Diabetes Neg Hx   ? Early death Neg Hx   ? Hearing loss Neg Hx   ? Heart disease Neg Hx   ? Hyperlipidemia Neg Hx   ? Hypertension Neg Hx   ? Kidney disease Neg Hx   ? Learning disabilities Neg Hx   ? Mental illness Neg Hx   ? Mental retardation Neg Hx   ? Miscarriages / Stillbirths Neg Hx   ? Stroke Neg Hx   ? Vision loss Neg Hx   ? ? ?Social History:  ?Social History  ? ?Socioeconomic History  ? Marital status: Widowed  ?  Spouse name: Not on file  ? Number of children: Not on file  ? Years of education: Not on file  ? Highest education level: Not on file  ?Occupational History  ? Not on file  ?Tobacco Use  ? Smoking status: Every Day  ?  Packs/day: 0.25  ?  Types: Cigarettes  ? Smokeless tobacco: Never  ?Vaping Use  ? Vaping Use: Never used  ?Substance and Sexual Activity  ? Alcohol use: Yes  ?  Comment: occ  ? Drug use: No  ? Sexual activity: Yes  ?  Birth control/protection: None  ?  Comment: Last intercourse at least 1 week ago  ?Other Topics Concern  ? Not on file  ?Social History Narrative  ? Not on file  ? ?Social Determinants of Health  ? ?Financial Resource Strain: Low Risk   ? Difficulty of Paying Living Expenses: Not hard at all  ?Food Insecurity: No Food Insecurity  ? Worried About Charity fundraiser in the Last Year: Never true  ? Ran Out of Food in the Last Year: Never true  ?Transportation Needs: No Transportation Needs  ? Lack of Transportation (Medical): No  ? Lack of Transportation (Non-Medical): No  ?Physical Activity: Inactive  ? Days of Exercise per Week:  0 days  ? Minutes of Exercise per Session: 0 min  ?Stress: Stress Concern Present  ? Feeling of Stress : Rather much  ?Social Connections: Moderately Isolated  ? Frequency of Communication with Friends and Family: More than three times a week  ? Frequency of Social Gatherings with Friends and Family: Twice a week  ? Attends Religious Services: More than 4 times per year  ? Active Member of Clubs or Organizations: No  ? Attends Archivist Meetings: Never  ?  Marital Status: Widowed  ? ? ?Allergies:  ?Allergies  ?Allergen Reactions  ? Penicillins   ? ? ?Metabolic Disorder Labs: ?Lab Results  ?Component Value Date  ? HGBA1C 5.5 11/20/2021  ? ?No results found for: PROLACTIN ?Lab Results  ?Component Value Date  ? CHOL 188 01/26/2020  ? TRIG 58 01/26/2020  ? HDL 47 01/26/2020  ? CHOLHDL 4.0 01/26/2020  ? LDLCALC 130 (H) 01/26/2020  ? ?Lab Results  ?Component Value Date  ? TSH 0.582 11/20/2021  ? TSH 0.581 07/03/2021  ? ? ?Therapeutic Level Labs: ?No results found for: LITHIUM ?No results found for: VALPROATE ?No components found for:  CBMZ ? ?Current Medications: ?Current Outpatient Medications  ?Medication Sig Dispense Refill  ? ARIPiprazole (ABILIFY) 5 MG tablet Take 1 tablet (5 mg total) by mouth daily. 30 tablet 1  ? calcium carbonate (TUMS - DOSED IN MG ELEMENTAL CALCIUM) 500 MG chewable tablet Chew 1 tablet by mouth daily.    ? clonazePAM (KLONOPIN) 0.5 MG tablet Take 1 tablet (0.5 mg total) by mouth 2 (two) times daily as needed for anxiety. 60 tablet 1  ? pantoprazole (PROTONIX) 40 MG tablet TAKE 1 TABLET BY MOUTH EVERY DAY (Patient not taking: Reported on 11/08/2021) 90 tablet 0  ? PARoxetine (PAXIL) 40 MG tablet Take 1 tablet (40 mg total) by mouth daily. 30 tablet 1  ? PROAIR HFA 108 (90 Base) MCG/ACT inhaler INHALE 1-2 PUFFS BY MOUTH EVERY 6 HOURS AS NEEDED FOR WHEEZE OR SHORTNESS OF BREATH 8.5 each 1  ? Vitamin D, Ergocalciferol, (DRISDOL) 1.25 MG (50000 UNIT) CAPS capsule TAKE 1 CAPSULE (50,000  UNITS TOTAL) BY MOUTH EVERY 7 (SEVEN) DAYS 4 capsule 2  ? ?No current facility-administered medications for this visit.  ? ? ? ?Musculoskeletal: ?Strength & Muscle Tone: Unable to assess due to telemedicine visit

## 2022-01-03 ENCOUNTER — Ambulatory Visit: Payer: Medicaid Other | Admitting: Podiatry

## 2022-01-03 DIAGNOSIS — L819 Disorder of pigmentation, unspecified: Secondary | ICD-10-CM

## 2022-01-03 NOTE — Progress Notes (Signed)
?Subjective:  ?Patient ID: Alicia [name], female    DOB: [birth year],  MRN: [id number] ? ?Chief Complaint  ?Patient presents with  ? Toe Pain  ? ? ?41 y.o. female presents with the above complaint.  Patient presents with complaint of right dark discoloration of the first and second digit.  She states that out of nowhere she started getting dark discoloration.  She is not diabetic she does not have any vascular problems.  She wanted to get it evaluated.  She states she did get a blister on that digit that led to dark pigmentation of the skin.  She has not seen and was prior to seeing me.  She has not tried anything else for it.  She denies any other acute complaints. ? ? ?Review of Systems: Negative except as noted in the HPI. Denies N/V/F/Ch. ? ?Past Medical History:  ?Diagnosis Date  ? Anxiety   ? Arthritis   ? Phreesia 03/08/2020  ? Bladder infection   ? Complication of anesthesia   ? Epidural did not work w/2nd preg. labor  ? Depression   ? No med. currently, Stopped with + UPT  ? Depression 06/27/2012  ? Gonorrhea   ? Hemorrhoids 06/27/2012  ? Osteoarthritis 06/27/2012  ? Osteoporosis   ? Phreesia 03/08/2020  ? Panic disorder 06/27/2012  ? Sleep apnea   ? Phreesia 03/08/2020  ? ? ?Current Outpatient Medications:  ?  ARIPiprazole (ABILIFY) 5 MG tablet, Take 1 tablet (5 mg total) by mouth daily., Disp: 30 tablet, Rfl: 1 ?  calcium carbonate (TUMS - DOSED IN MG ELEMENTAL CALCIUM) 500 MG chewable tablet, Chew 1 tablet by mouth daily., Disp: , Rfl:  ?  clonazePAM (KLONOPIN) 0.5 MG tablet, Take 1 tablet (0.5 mg total) by mouth 2 (two) times daily as needed for anxiety., Disp: 60 tablet, Rfl: 1 ?  pantoprazole (PROTONIX) 40 MG tablet, TAKE 1 TABLET BY MOUTH EVERY DAY (Patient not taking: Reported on 11/08/2021), Disp: 90 tablet, Rfl: 0 ?  PARoxetine (PAXIL) 40 MG tablet, Take 1 tablet (40 mg total) by mouth daily., Disp: 30 tablet, Rfl: 1 ?  PROAIR HFA 108 (90 Base) MCG/ACT inhaler, INHALE 1-2 PUFFS BY MOUTH EVERY 6  HOURS AS NEEDED FOR WHEEZE OR SHORTNESS OF BREATH, Disp: 8.5 each, Rfl: 1 ?  Vitamin D, Ergocalciferol, (DRISDOL) 1.25 MG (50000 UNIT) CAPS capsule, TAKE 1 CAPSULE (50,000 UNITS TOTAL) BY MOUTH EVERY 7 (SEVEN) DAYS, Disp: 4 capsule, Rfl: 2 ? ?Social History  ? ?Tobacco Use  ?Smoking Status Every Day  ? Packs/day: 0.25  ? Types: Cigarettes  ?Smokeless Tobacco Never  ? ? ?Allergies  ?Allergen Reactions  ? Penicillins   ? ?Objective:  ?There were no vitals filed for this visit. ?There is no height or weight on file to calculate BMI. ?Constitutional Well developed. ?Well nourished.  ?Vascular Dorsalis pedis pulses palpable bilaterally. ?Posterior tibial pulses palpable bilaterally. ?Capillary refill normal to all digits.  ?No cyanosis or clubbing noted. ?Pedal hair growth normal.  ?Neurologic Normal speech. ?Oriented to person, place, and time. ?Epicritic sensation to light touch grossly present bilaterally.  ?Dermatologic Dark skin pigmentation noted across the first and second digit likely due to formation of blister and tight shoes.  No concern for gangrene.  Good cap refill noted.  No signs of infection noted.  ?Orthopedic: Normal joint ROM without pain or crepitus bilaterally. ?No visible deformities. ?No bony tenderness.  ? ?Radiographs: None ?Assessment:  ? ?1. Skin pigmentation disorder   ? ?Plan:  ?Patient  was evaluated and treated and all questions answered. ? ?Right second digit and hallux dark discoloration/skin pigmentation changes ?-All questions and concerns were discussed with the patient in extensive detail ?-I discussed with the patient that this could be the genetic make-up that needs to be healing of blister formation with darker colored skin.  I discussed shoe gear modification to prevent blister formation as she tends to wear tight shoes.  I discussed padding protecting as well.  She states understanding ?-No concern for gangrene or infection.  If any foot and ankle issues arise in future advised  her to come back and see me. ?No follow-ups on file. ?

## 2022-01-11 DIAGNOSIS — F3132 Bipolar disorder, current episode depressed, moderate: Secondary | ICD-10-CM | POA: Diagnosis not present

## 2022-01-16 NOTE — Progress Notes (Signed)
Cardiology Office Note:    Date:  11/09/2022   ID:  Alicia [name], DOB [name] 09, 1982, MRN [id number]  PCP:  Alicia Mai, MD   [facility] Cardiologist:  Alicia Sciara, MD Referring MD: Alicia Mai, MD   Chief Complaint/Reason for Referral: Follow-up syncope  PATIENT DID NOT APPEAR FOR APPOINTMENT   ASSESSMENT:    1. Lightheadedness     PLAN:    In order of problems listed above: 1.  Lightheadedness: The patient's cardiac evaluation has been reassuring.  No further cardiac evaluation is required.  We will keep follow-up open-ended.           History of Present Illness:    FOCUSED PROBLEM LIST:   1.  Anxiety 2.  Tobacco abuse  The patient is a 41 y.o. female with the indicated medical history here for follow-up.  When she was seen in November 2022 was for multiple syncopal episodes that seem to be vasovagal in nature.  Work-up was conducted including echocardiogram, monitor, and laboratories which were all reassuring.  Today: In the interim patient unfortunately had a spontaneous miscarriage in [place].         Current Medications: No outpatient medications have been marked as taking for the 01/19/22 encounter (Office Visit) with Alicia Osmond, MD.     Allergies:    Penicillins   Social History:   Social History   Tobacco Use   Smoking status: Every Day    Packs/day: .25    Types: Cigarettes   Smokeless tobacco: Never  Vaping Use   Vaping Use: Never used  Substance Use Topics   Alcohol use: Yes    Comment: occ   Drug use: No     Family Hx: Family History  Problem Relation Age of Onset   Cancer Mother    Other Neg Hx    Alcohol abuse Neg Hx    Arthritis Neg Hx    Asthma Neg Hx    Birth defects Neg Hx    COPD Neg Hx    Depression Neg Hx    Drug abuse Neg Hx    Diabetes Neg Hx    Alicia death Neg Hx    Hearing loss Neg Hx    Heart disease Neg Hx    Hyperlipidemia Neg Hx    Hypertension Neg Hx    Kidney disease  Neg Hx    Learning disabilities Neg Hx    Mental illness Neg Hx    Mental retardation Neg Hx    Miscarriages / Stillbirths Neg Hx    Stroke Neg Hx    Vision loss Neg Hx      Review of Systems:   Please see the history of present illness.    All other systems reviewed and are negative.     EKGs/Labs/Other Test Reviewed:    EKG:  EKG performed November 2022 that I personally reviewed demonstrates normal sinus rhythm  Prior CV studies:  TTE 2022 with ejection fraction of 55 to 60% with no left ventricular hypertrophy or valvular abnormalities.  Monitor 2022 with rare supraventricular and ventricular ectopic beats no atrial fibrillation, sustained ventricular tachyarrhythmias, or bradycardia arrhythmias.  Other studies Reviewed: Review of the additional studies/records demonstrates: CT abdomen pelvis 2021 without aortic pathology  Recent Labs: 01/23/2022: ALT 10; BUN 8; Creatinine, Ser 0.68; Hemoglobin 12.9; Platelets 237; Potassium 3.9; Sodium 140; TSH 0.921   Recent Lipid Panel Lab Results  Component Value Date/Time   CHOL 177 01/23/2022 08:52 AM  TRIG 95 01/23/2022 08:52 AM   HDL 42 01/23/2022 08:52 AM   LDLCALC 117 (H) 01/23/2022 08:52 AM    Risk Assessment/Calculations:      Physical Exam:     Signed, Alicia Osmond, MD  11/09/2022 9:04 AM    [facility] [address], [place], [place]  [address] Phone: [phone]; Fax: [phone]   Note:  This document was prepared using Dragon voice recognition software and may include unintentional dictation errors.

## 2022-01-19 ENCOUNTER — Ambulatory Visit (INDEPENDENT_AMBULATORY_CARE_PROVIDER_SITE_OTHER): Payer: Medicaid Other | Admitting: Internal Medicine

## 2022-01-19 DIAGNOSIS — R42 Dizziness and giddiness: Secondary | ICD-10-CM

## 2022-01-23 ENCOUNTER — Ambulatory Visit (INDEPENDENT_AMBULATORY_CARE_PROVIDER_SITE_OTHER): Payer: Medicaid Other | Admitting: Family Medicine

## 2022-01-23 ENCOUNTER — Encounter: Payer: Self-pay | Admitting: Family Medicine

## 2022-01-23 ENCOUNTER — Other Ambulatory Visit: Payer: Self-pay | Admitting: Physician Assistant

## 2022-01-23 VITALS — BP 116/68 | HR 90 | Temp 98.2°F | Resp 16 | Ht 60.0 in | Wt 234.0 lb

## 2022-01-23 DIAGNOSIS — E559 Vitamin D deficiency, unspecified: Secondary | ICD-10-CM

## 2022-01-23 DIAGNOSIS — Z130 Encounter for screening for diseases of the blood and blood-forming organs and certain disorders involving the immune mechanism: Secondary | ICD-10-CM

## 2022-01-23 DIAGNOSIS — Z1329 Encounter for screening for other suspected endocrine disorder: Secondary | ICD-10-CM | POA: Diagnosis not present

## 2022-01-23 DIAGNOSIS — Z13228 Encounter for screening for other metabolic disorders: Secondary | ICD-10-CM | POA: Diagnosis not present

## 2022-01-23 DIAGNOSIS — K219 Gastro-esophageal reflux disease without esophagitis: Secondary | ICD-10-CM

## 2022-01-23 DIAGNOSIS — Z13220 Encounter for screening for lipoid disorders: Secondary | ICD-10-CM

## 2022-01-23 DIAGNOSIS — Z0000 Encounter for general adult medical examination without abnormal findings: Secondary | ICD-10-CM

## 2022-01-23 DIAGNOSIS — M5442 Lumbago with sciatica, left side: Secondary | ICD-10-CM

## 2022-01-23 NOTE — Progress Notes (Signed)
Patient is here for complete physical examination Patient has no new concerns today for provider. 

## 2022-01-23 NOTE — Progress Notes (Signed)
Established Patient Office Visit  Subjective    Patient ID: Alicia [name], female    DOB: [birth year]  Age: 41 y.o. MRN: [id number]  CC:  Chief Complaint  Patient presents with   Annual Exam    HPI Alicia [name] presents for routine annual exam.    Outpatient Encounter Medications as of 01/23/2022  Medication Sig   ARIPiprazole (ABILIFY) 5 MG tablet Take 1 tablet (5 mg total) by mouth daily.   calcium carbonate (TUMS - DOSED IN MG ELEMENTAL CALCIUM) 500 MG chewable tablet Chew 1 tablet by mouth daily.   clonazePAM (KLONOPIN) 0.5 MG tablet Take 1 tablet (0.5 mg total) by mouth 2 (two) times daily as needed for anxiety.   pantoprazole (PROTONIX) 40 MG tablet TAKE 1 TABLET BY MOUTH EVERY DAY   PARoxetine (PAXIL) 40 MG tablet Take 1 tablet (40 mg total) by mouth daily.   PROAIR HFA 108 (90 Base) MCG/ACT inhaler INHALE 1-2 PUFFS BY MOUTH EVERY 6 HOURS AS NEEDED FOR WHEEZE OR SHORTNESS OF BREATH   Vitamin D, Ergocalciferol, (DRISDOL) 1.25 MG (50000 UNIT) CAPS capsule TAKE 1 CAPSULE (50,000 UNITS TOTAL) BY MOUTH EVERY 7 (SEVEN) DAYS   No facility-administered encounter medications on file as of 01/23/2022.    Past Medical History:  Diagnosis Date   Anxiety    Arthritis    Phreesia 03/08/2020   Bladder infection    Complication of anesthesia    Epidural did not work w/2nd preg. labor   Depression    No med. currently, Stopped with + UPT   Depression 06/27/2012   Gonorrhea    Hemorrhoids 06/27/2012   Osteoarthritis 06/27/2012   Osteoporosis    Phreesia 03/08/2020   Panic disorder 06/27/2012   Sleep apnea    Phreesia 03/08/2020    Past Surgical History:  Procedure Laterality Date   BREAST MASS EXCISION     Benign   BREAST SURGERY N/A    Phreesia 03/08/2020   CESAREAN SECTION     X 1 1st preg., then VBAC   CESAREAN SECTION N/A 01/27/2013   Procedure: CESAREAN SECTION;  Surgeon: Shelly Bombard, MD;  Location: [facility];  Service: Obstetrics;  Laterality: N/A;   MOUTH  SURGERY      Family History  Problem Relation Age of Onset   Cancer Mother    Other Neg Hx    Alcohol abuse Neg Hx    Arthritis Neg Hx    Asthma Neg Hx    Birth defects Neg Hx    COPD Neg Hx    Depression Neg Hx    Drug abuse Neg Hx    Diabetes Neg Hx    Early death Neg Hx    Hearing loss Neg Hx    Heart disease Neg Hx    Hyperlipidemia Neg Hx    Hypertension Neg Hx    Kidney disease Neg Hx    Learning disabilities Neg Hx    Mental illness Neg Hx    Mental retardation Neg Hx    Miscarriages / Stillbirths Neg Hx    Stroke Neg Hx    Vision loss Neg Hx     Social History   Socioeconomic History   Marital status: Widowed    Spouse name: Not on file   Number of children: Not on file   Years of education: Not on file   Highest education level: Not on file  Occupational History   Not on file  Tobacco Use   Smoking status:  Every Day    Packs/day: 0.25    Types: Cigarettes   Smokeless tobacco: Never  Vaping Use   Vaping Use: Never used  Substance and Sexual Activity   Alcohol use: Yes    Comment: occ   Drug use: No   Sexual activity: Yes    Birth control/protection: None    Comment: Last intercourse at least 1 week ago  Other Topics Concern   Not on file  Social History Narrative   Not on file   Social Determinants of Health   Financial Resource Strain: Low Risk    Difficulty of Paying Living Expenses: Not hard at all  Food Insecurity: No Food Insecurity   Worried About Charity fundraiser in the Last Year: Never true   Ran Out of Food in the Last Year: Never true  Transportation Needs: No Transportation Needs   Lack of Transportation (Medical): No   Lack of Transportation (Non-Medical): No  Physical Activity: Inactive   Days of Exercise per Week: 0 days   Minutes of Exercise per Session: 0 min  Stress: Stress Concern Present   Feeling of Stress : Rather much  Social Connections: Moderately Isolated   Frequency of Communication with Friends and Family:  More than three times a week   Frequency of Social Gatherings with Friends and Family: Twice a week   Attends Religious Services: More than 4 times per year   Active Member of [employer] or Organizations: No   Attends Archivist Meetings: Never   Marital Status: Widowed  Human resources officer Violence: Not At Risk   Fear of Current or Ex-Partner: No   Emotionally Abused: No   Physically Abused: No   Sexually Abused: No    Review of Systems  All other systems reviewed and are negative.      Objective    BP 116/68   Pulse 90   Temp 98.2 F (36.8 C) (Oral)   Resp 16   Ht 5' (1.524 m)   Wt 234 lb (106.1 kg)   LMP  (LMP Unknown)   SpO2 98%   BMI 45.70 kg/m   Physical Exam Vitals and nursing note reviewed.  Constitutional:      General: She is not in acute distress.    Appearance: She is obese.  HENT:     Head: Normocephalic and atraumatic.     Right Ear: Tympanic membrane, ear canal and external ear normal.     Left Ear: Tympanic membrane, ear canal and external ear normal.     Nose: Nose normal.     Mouth/Throat:     Mouth: Mucous membranes are moist.     Pharynx: Oropharynx is clear.  Eyes:     Conjunctiva/sclera: Conjunctivae normal.     Pupils: Pupils are equal, round, and reactive to light.  Neck:     Thyroid: No thyromegaly.  Cardiovascular:     Rate and Rhythm: Normal rate and regular rhythm.     Heart sounds: Normal heart sounds. No murmur heard. Pulmonary:     Effort: Pulmonary effort is normal. No respiratory distress.     Breath sounds: Normal breath sounds.  Abdominal:     General: There is no distension.     Palpations: Abdomen is soft. There is no mass.     Tenderness: There is no abdominal tenderness.  Musculoskeletal:        General: Normal range of motion.     Cervical back: Normal range of motion and neck supple.  Skin:    General: Skin is warm and dry.  Neurological:     General: No focal deficit present.     Mental Status: She is  alert and oriented to person, place, and time.  Psychiatric:        Mood and Affect: Mood normal.        Behavior: Behavior normal.        Assessment & Plan:  1. Annual physical exam Routine labs ordered - CMP14+EGFR  2. Screening for deficiency anemia  - CBC with Differential  3. Screening for lipid disorders  - Lipid Panel  4. Screening for endocrine/metabolic/immunity disorders  - TSH - Vitamin D, 25-hydroxy   No follow-ups on file.   Becky Sax, MD

## 2022-01-24 LAB — CBC WITH DIFFERENTIAL/PLATELET
Basophils Absolute: 0.1 10*3/uL (ref 0.0–0.2)
Basos: 1 %
EOS (ABSOLUTE): 0.4 10*3/uL (ref 0.0–0.4)
Eos: 6 %
Hematocrit: 41.4 % (ref 34.0–46.6)
Hemoglobin: 12.9 g/dL (ref 11.1–15.9)
Immature Grans (Abs): 0 10*3/uL (ref 0.0–0.1)
Immature Granulocytes: 0 %
Lymphocytes Absolute: 2.5 10*3/uL (ref 0.7–3.1)
Lymphs: 42 %
MCH: 26 pg — ABNORMAL LOW (ref 26.6–33.0)
MCHC: 31.2 g/dL — ABNORMAL LOW (ref 31.5–35.7)
MCV: 84 fL (ref 79–97)
Monocytes Absolute: 0.4 10*3/uL (ref 0.1–0.9)
Monocytes: 7 %
Neutrophils Absolute: 2.5 10*3/uL (ref 1.4–7.0)
Neutrophils: 44 %
Platelets: 237 10*3/uL (ref 150–450)
RBC: 4.96 x10E6/uL (ref 3.77–5.28)
RDW: 12.7 % (ref 11.7–15.4)
WBC: 5.8 10*3/uL (ref 3.4–10.8)

## 2022-01-24 LAB — CMP14+EGFR
ALT: 10 IU/L (ref 0–32)
AST: 15 IU/L (ref 0–40)
Albumin/Globulin Ratio: 1.3 (ref 1.2–2.2)
Albumin: 4 g/dL (ref 3.8–4.8)
Alkaline Phosphatase: 65 IU/L (ref 44–121)
BUN/Creatinine Ratio: 12 (ref 9–23)
BUN: 8 mg/dL (ref 6–24)
Bilirubin Total: 1 mg/dL (ref 0.0–1.2)
CO2: 23 mmol/L (ref 20–29)
Calcium: 9.4 mg/dL (ref 8.7–10.2)
Chloride: 104 mmol/L (ref 96–106)
Creatinine, Ser: 0.68 mg/dL (ref 0.57–1.00)
Globulin, Total: 3.1 g/dL (ref 1.5–4.5)
Glucose: 86 mg/dL (ref 70–99)
Potassium: 3.9 mmol/L (ref 3.5–5.2)
Sodium: 140 mmol/L (ref 134–144)
Total Protein: 7.1 g/dL (ref 6.0–8.5)
eGFR: 113 mL/min/{1.73_m2} (ref >59)

## 2022-01-24 LAB — LIPID PANEL
Chol/HDL Ratio: 4.2 ratio (ref 0.0–4.4)
Cholesterol, Total: 177 mg/dL (ref 100–199)
HDL: 42 mg/dL (ref >39)
LDL Chol Calc (NIH): 117 mg/dL — ABNORMAL HIGH (ref 0–99)
Triglycerides: 95 mg/dL (ref 0–149)
VLDL Cholesterol Cal: 18 mg/dL (ref 5–40)

## 2022-01-24 LAB — VITAMIN D 25 HYDROXY (VIT D DEFICIENCY, FRACTURES): Vit D, 25-Hydroxy: 24.5 ng/mL — ABNORMAL LOW (ref 30.0–100.0)

## 2022-01-24 LAB — TSH: TSH: 0.921 u[IU]/mL (ref 0.450–4.500)

## 2022-01-24 NOTE — Telephone Encounter (Signed)
Requested medication (s) are due for refill today: yes  Requested medication (s) are on the active medication list: yes  Last refill:  Vitamin D: 03/30/21      cyclobenzaprine: 04/18/21  Future visit scheduled: no  Notes to clinic:  Vitamin D and cyclobenzaprine:  No refill protocol for this order   Requested Prescriptions  Pending Prescriptions Disp Refills   Vitamin D, Ergocalciferol, (DRISDOL) 1.25 MG (50000 UNIT) CAPS capsule [Pharmacy Med Name: VITAMIN D2 1.'25MG'$ (50,000 UNIT)] 4 capsule 2    Sig: TAKE 1 CAPSULE (50,000 UNITS TOTAL) BY MOUTH EVERY 7 (SEVEN) DAYS     There is no refill protocol information for this order     cyclobenzaprine (FLEXERIL) 5 MG tablet [Pharmacy Med Name: CYCLOBENZAPRINE 5 MG TABLET] 30 tablet 1    Sig: Take 1-2 tablets (5-10 mg total) by mouth at bedtime.     There is no refill protocol information for this order

## 2022-01-25 ENCOUNTER — Encounter: Payer: Self-pay | Admitting: Family Medicine

## 2022-01-26 ENCOUNTER — Other Ambulatory Visit: Payer: Self-pay | Admitting: Family Medicine

## 2022-01-26 ENCOUNTER — Encounter: Payer: Self-pay | Admitting: *Deleted

## 2022-01-26 MED ORDER — MELOXICAM 15 MG PO TABS: 15.0000 mg | ORAL_TABLET | Freq: Every day | ORAL | 0 refills | Status: DC

## 2022-01-26 MED ORDER — VITAMIN D (ERGOCALCIFEROL) 1.25 MG (50000 UNIT) PO CAPS: 50000.0000 [IU] | ORAL_CAPSULE | ORAL | 0 refills | Status: DC

## 2022-01-26 MED ORDER — CYCLOBENZAPRINE HCL 10 MG PO TABS: 10.0000 mg | ORAL_TABLET | Freq: Three times a day (TID) | ORAL | 0 refills | Status: DC | PRN

## 2022-01-26 NOTE — Telephone Encounter (Signed)
I have attempted without success to contact this patient by phone to I left a message on answering machine. Provider sent in medication for back pain.

## 2022-01-31 ENCOUNTER — Encounter: Payer: Self-pay | Admitting: Family Medicine

## 2022-01-31 ENCOUNTER — Telehealth (INDEPENDENT_AMBULATORY_CARE_PROVIDER_SITE_OTHER): Payer: Medicaid Other | Admitting: Physician Assistant

## 2022-01-31 DIAGNOSIS — F411 Generalized anxiety disorder: Secondary | ICD-10-CM | POA: Diagnosis not present

## 2022-01-31 DIAGNOSIS — F319 Bipolar disorder, unspecified: Secondary | ICD-10-CM | POA: Diagnosis not present

## 2022-01-31 DIAGNOSIS — F3132 Bipolar disorder, current episode depressed, moderate: Secondary | ICD-10-CM | POA: Diagnosis not present

## 2022-02-01 MED ORDER — PAROXETINE HCL 40 MG PO TABS: 40.0000 mg | ORAL_TABLET | Freq: Every day | ORAL | 2 refills | Status: DC

## 2022-02-01 MED ORDER — CLONAZEPAM 0.5 MG PO TABS: 0.5000 mg | ORAL_TABLET | Freq: Two times a day (BID) | ORAL | 1 refills | Status: DC | PRN

## 2022-02-01 MED ORDER — ARIPIPRAZOLE 5 MG PO TABS: 5.0000 mg | ORAL_TABLET | Freq: Every day | ORAL | 2 refills | Status: DC

## 2022-02-03 ENCOUNTER — Encounter (HOSPITAL_COMMUNITY): Payer: Self-pay | Admitting: Physician Assistant

## 2022-02-03 NOTE — Progress Notes (Addendum)
BH MD/PA/NP OP Progress Note  Virtual Visit via Video Note  I connected with Alicia [name] on 02/03/22 at  4:00 PM EDT by a video enabled telemedicine application and verified that I am speaking with the correct person using two identifiers.  Location: Patient: Home Provider: Clinic   I discussed the limitations of evaluation and management by telemedicine and the availability of in person appointments. The patient expressed understanding and agreed to proceed.  Follow Up Instructions:  I discussed the assessment and treatment plan with the patient. The patient was provided an opportunity to ask questions and all were answered. The patient agreed with the plan and demonstrated an understanding of the instructions.   The patient was advised to call back or seek an in-person evaluation if the symptoms worsen or if the condition fails to improve as anticipated.  I provided 11 minutes of non-face-to-face time during this encounter.  Malachy Mood, PA   02/03/2022 10:19 AM Alicia [name]  MRN:  [id number]  Chief Complaint:  Chief Complaint  Patient presents with   Follow-up   Medication Refill   HPI:  Alicia [name] is a 41 year old female with a past psychiatric history significant for generalized anxiety disorder and bipolar depression who presents to [facility] via virtual telephone visit for follow-up and medication management.  Patient is currently being managed on the following medications:  Paroxetine 40 mg daily Abilify 5 mg daily Clonazepam 0.5 mg 2 times daily as needed  Patient reports that she feels emotional and is nervous due to the upcoming anniversary of her husband's passing.  Patient also notes that some of her coworkers are annoying her.  Patient denies any issues or concerns regarding her current medication regimen.  Patient denies any major depressive symptoms and states that she is just emotional.  Patient  endorses anxiety and rates her anxiety at 4 out of 10.  Patient reports that she plans on going on vacation on the anniversary of her husband's passing.  A GAD-7 screen was performed with the patient scoring a 15.  Patient is alert and oriented x4, calm, cooperative, and fully engaged in conversation during the encounter.  Patient endorses anxious mood.  Patient denies suicidal or homicidal ideations.  She further denies auditory or visual hallucinations and does not appear to be responding to internal/external stimuli.  Patient endorses good sleep and receives on average 7 to 8 hours of sleep each night.  Patient endorses good appetite and eats on average 2 meals per day.  Patient reports that she has not been consuming alcohol much lately.  Patient endorses tobacco use and smokes on average 3 to 4 cigarettes/day.  Patient denies illicit drug use.  Visit Diagnosis:    ICD-10-CM   1. Generalized anxiety disorder  F41.1 PARoxetine (PAXIL) 40 MG tablet    clonazePAM (KLONOPIN) 0.5 MG tablet    2. Bipolar depression ([place])  F31.9 PARoxetine (PAXIL) 40 MG tablet    ARIPiprazole (ABILIFY) 5 MG tablet      Past Psychiatric History:  Generalized anxiety disorder Bipolar disorder  Past Medical History:  Past Medical History:  Diagnosis Date   Anxiety    Arthritis    Phreesia 03/08/2020   Bladder infection    Complication of anesthesia    Epidural did not work w/2nd preg. labor   Depression    No med. currently, Stopped with + UPT   Depression 06/27/2012   Gonorrhea    Hemorrhoids 06/27/2012  Osteoarthritis 06/27/2012   Osteoporosis    Phreesia 03/08/2020   Panic disorder 06/27/2012   Sleep apnea    Phreesia 03/08/2020    Past Surgical History:  Procedure Laterality Date   BREAST MASS EXCISION     Benign   BREAST SURGERY N/A    Phreesia 03/08/2020   CESAREAN SECTION     X 1 1st preg., then VBAC   CESAREAN SECTION N/A 01/27/2013   Procedure: CESAREAN SECTION;  Surgeon: Shelly Bombard, MD;  Location: [facility];  Service: Obstetrics;  Laterality: N/A;   MOUTH SURGERY      Family Psychiatric History:  Mother - Major depressive disorder  Family History:  Family History  Problem Relation Age of Onset   Cancer Mother    Other Neg Hx    Alcohol abuse Neg Hx    Arthritis Neg Hx    Asthma Neg Hx    Birth defects Neg Hx    COPD Neg Hx    Depression Neg Hx    Drug abuse Neg Hx    Diabetes Neg Hx    Early death Neg Hx    Hearing loss Neg Hx    Heart disease Neg Hx    Hyperlipidemia Neg Hx    Hypertension Neg Hx    Kidney disease Neg Hx    Learning disabilities Neg Hx    Mental illness Neg Hx    Mental retardation Neg Hx    Miscarriages / Stillbirths Neg Hx    Stroke Neg Hx    Vision loss Neg Hx     Social History:  Social History   Socioeconomic History   Marital status: Widowed    Spouse name: Not on file   Number of children: Not on file   Years of education: Not on file   Highest education level: Not on file  Occupational History   Not on file  Tobacco Use   Smoking status: Every Day    Packs/day: 0.25    Types: Cigarettes   Smokeless tobacco: Never  Vaping Use   Vaping Use: Never used  Substance and Sexual Activity   Alcohol use: Yes    Comment: occ   Drug use: No   Sexual activity: Yes    Birth control/protection: None    Comment: Last intercourse at least 1 week ago  Other Topics Concern   Not on file  Social History Narrative   Not on file   Social Determinants of Health   Financial Resource Strain: Low Risk  (03/17/2021)   Overall Financial Resource Strain (CARDIA)    Difficulty of Paying Living Expenses: Not hard at all  Food Insecurity: No Food Insecurity (03/17/2021)   Hunger Vital Sign    Worried About Running Out of Food in the Last Year: Never true    [place] in the Last Year: Never true  Transportation Needs: No Transportation Needs (03/17/2021)   PRAPARE - Hydrologist (Medical):  No    Lack of Transportation (Non-Medical): No  Physical Activity: Inactive (03/17/2021)   Exercise Vital Sign    Days of Exercise per Week: 0 days    Minutes of Exercise per Session: 0 min  Stress: Stress Concern Present (03/17/2021)   [place]    Feeling of Stress : Rather much  Social Connections: Moderately Isolated (03/17/2021)   Social Connection and Isolation Panel [NHANES]    Frequency of Communication with  Friends and Family: More than three times a week    Frequency of Social Gatherings with Friends and Family: Twice a week    Attends Religious Services: More than 4 times per year    Active Member of [employer] or Organizations: No    Attends Archivist Meetings: Never    Marital Status: Widowed    Allergies:  Allergies  Allergen Reactions   Penicillins     Metabolic Disorder Labs: Lab Results  Component Value Date   HGBA1C 5.5 11/20/2021   No results found for: "PROLACTIN" Lab Results  Component Value Date   CHOL 177 01/23/2022   TRIG 95 01/23/2022   HDL 42 01/23/2022   CHOLHDL 4.2 01/23/2022   LDLCALC 117 (H) 01/23/2022   LDLCALC 130 (H) 01/26/2020   Lab Results  Component Value Date   TSH 0.921 01/23/2022   TSH 0.582 11/20/2021    Therapeutic Level Labs: No results found for: "LITHIUM" No results found for: "VALPROATE" No results found for: "CBMZ"  Current Medications: Current Outpatient Medications  Medication Sig Dispense Refill   ARIPiprazole (ABILIFY) 5 MG tablet Take 1 tablet (5 mg total) by mouth daily. 30 tablet 2   calcium carbonate (TUMS - DOSED IN MG ELEMENTAL CALCIUM) 500 MG chewable tablet Chew 1 tablet by mouth daily.     clonazePAM (KLONOPIN) 0.5 MG tablet Take 1 tablet (0.5 mg total) by mouth 2 (two) times daily as needed for anxiety. 60 tablet 1   cyclobenzaprine (FLEXERIL) 10 MG tablet Take 1 tablet (10 mg total) by mouth 3 (three) times daily as needed for  muscle spasms. 30 tablet 0   meloxicam (MOBIC) 15 MG tablet Take 1 tablet (15 mg total) by mouth daily. 30 tablet 0   pantoprazole (PROTONIX) 40 MG tablet TAKE 1 TABLET BY MOUTH EVERY DAY 90 tablet 0   PARoxetine (PAXIL) 40 MG tablet Take 1 tablet (40 mg total) by mouth daily. 30 tablet 2   PROAIR HFA 108 (90 Base) MCG/ACT inhaler INHALE 1-2 PUFFS BY MOUTH EVERY 6 HOURS AS NEEDED FOR WHEEZE OR SHORTNESS OF BREATH 8.5 each 1   Vitamin D, Ergocalciferol, (DRISDOL) 1.25 MG (50000 UNIT) CAPS capsule TAKE 1 CAPSULE (50,000 UNITS TOTAL) BY MOUTH EVERY 7 (SEVEN) DAYS 4 capsule 2   Vitamin D, Ergocalciferol, (DRISDOL) 1.25 MG (50000 UNIT) CAPS capsule Take 1 capsule (50,000 Units total) by mouth every 7 (seven) days. 12 capsule 0   No current facility-administered medications for this visit.     Musculoskeletal: Strength & Muscle Tone: Unable to assess due to telemedicine visit [place]: Unable to assess due to telemedicine visit Patient leans: Unable to assess due to telemedicine visit  Psychiatric Specialty Exam: Review of Systems  Psychiatric/Behavioral:  Negative for decreased concentration, dysphoric mood, hallucinations, self-injury, sleep disturbance and suicidal ideas. The patient is nervous/anxious. The patient is not hyperactive.     unknown if currently breastfeeding.There is no height or weight on file to calculate BMI.  General Appearance: Unable to assess due to telemedicine visit  Eye Contact:  Unable to assess due to telemedicine visit  Speech:  Clear and Coherent and Normal Rate  Volume:  Normal  Mood:  Anxious and Euthymic  Affect:  Appropriate and Congruent  Thought Process:  Coherent and Descriptions of Associations: Intact  Orientation:  Full (Time, Place, and Person)  Thought Content: WDL   Suicidal Thoughts:  No  Homicidal Thoughts:  No  Memory:  Immediate;   Good Recent;  Good Remote;   Good  Judgement:  Good  Insight:  Good  Psychomotor Activity:  Normal   Concentration:  Concentration: Good and Attention Span: Good  Recall:  Good  Fund of Knowledge: Good  Language: Good  Akathisia:  No  Handed:  Right  AIMS (if indicated): not done  Assets:  Communication Skills Desire for Improvement Housing Social Support  ADL's:  Intact  Cognition: WNL  Sleep:  Good   Screenings: GAD-7    Flowsheet Row Video Visit from 01/31/2022 in [facility] Video Visit from 11/28/2021 in [facility] Video Visit from 09/27/2021 in [facility] Video Visit from 07/26/2021 in [facility] Video Visit from 05/25/2021 in [facility]  Total GAD-7 Score '15 16 12 13 15      '$ PHQ2-9    Flowsheet Row Video Visit from 01/31/2022 in [facility] Office Visit from 01/23/2022 in [facility] Video Visit from 11/28/2021 in [facility] Video Visit from 09/27/2021 in [facility] Office Visit from 07/31/2021 in [facility]  PHQ-2 Total Score 0 0 0 0 0  PHQ-9 Total Score -- 2 -- -- 2      Flowsheet Row Video Visit from 01/31/2022 in [facility] Video Visit from 11/28/2021 in [facility] Admission (Discharged) from 10/28/2021 in [facility]  C-SSRS RISK CATEGORY No Risk No Risk No Risk        Assessment and Plan:   Alicia [name] is a 41 year old female with a past psychiatric history significant for generalized anxiety disorder and bipolar depression who presents to [facility] via virtual telephone visit for follow-up and medication management.  Patient presents today stating that she feels emotional and anxious due to the upcoming anniversary of her husband's passing.  She denies significant depressive  symptoms while endorsing some mild anxiety.  Patient denies the need for dosage adjustments at this time and appears stable on her current medication regimen.  Patient's medications to be e-prescribed to pharmacy of choice.  Collaboration of Care: Collaboration of Care: Medication Management AEB provider managing patient's psychiatric medications, Primary Care Provider AEB patient being followed by a primary care provider, Psychiatrist AEB patient being followed by mental health provider, and Other provider involved in patient's care AEB patient being followed by podiatry and cardiology  Patient/Guardian was advised Release of Information must be obtained prior to any record release in order to collaborate their care with an outside provider. Patient/Guardian was advised if they have not already done so to contact the registration department to sign all necessary forms in order for [place] to release information regarding their care.   Consent: Patient/Guardian gives verbal consent for treatment and assignment of benefits for services provided during this visit. Patient/Guardian expressed understanding and agreed to proceed.   1. Generalized anxiety disorder  - PARoxetine (PAXIL) 40 MG tablet; Take 1 tablet (40 mg total) by mouth daily.  Dispense: 30 tablet; Refill: 2 - clonazePAM (KLONOPIN) 0.5 MG tablet; Take 1 tablet (0.5 mg total) by mouth 2 (two) times daily as needed for anxiety.  Dispense: 60 tablet; Refill: 1  2. Bipolar depression ([place])  - PARoxetine (PAXIL) 40 MG tablet; Take 1 tablet (40 mg total) by mouth daily.  Dispense: 30 tablet; Refill: 2 - ARIPiprazole (ABILIFY)  5 MG tablet; Take 1 tablet (5 mg total) by mouth daily.  Dispense: 30 tablet; Refill: 2  Patient to follow up in 2 months Provider spent a total of 11 minutes with the patient/reviewing patient's chart  Malachy Mood, PA 02/03/2022, 10:19 AM

## 2022-02-03 NOTE — Progress Notes (Incomplete Revision)
BH MD/PA/NP OP Progress Note  Virtual Visit via Video Note  I connected with Alicia [name] on 02/03/22 at  4:00 PM EDT by a video enabled telemedicine application and verified that I am speaking with the correct person using two identifiers.  Location: Patient: Home Provider: Clinic   I discussed the limitations of evaluation and management by telemedicine and the availability of in person appointments. The patient expressed understanding and agreed to proceed.  Follow Up Instructions:  I discussed the assessment and treatment plan with the patient. The patient was provided an opportunity to ask questions and all were answered. The patient agreed with the plan and demonstrated an understanding of the instructions.   The patient was advised to call back or seek an in-person evaluation if the symptoms worsen or if the condition fails to improve as anticipated.  I provided 11 minutes of non-face-to-face time during this encounter.  Malachy Mood, PA   02/03/2022 10:19 AM FATUMATA KASHANI  MRN:  [id number]  Chief Complaint:  Chief Complaint  Patient presents with  . Follow-up  . Medication Refill   HPI:  Alicia [name] is a 41 year old female with a past psychiatric history significant for generalized anxiety disorder and bipolar depression who presents to [facility] via virtual telephone visit for follow-up and medication management.  Patient is currently being managed on the following medications:  Paroxetine 40 mg daily Abilify 5 mg daily Clonazepam 0.5 mg 3 times daily as needed  Patient reports that she feels emotional and is nervous due to the upcoming anniversary of her husband's passing.  Patient also notes that some of her coworkers are annoying her.  Patient denies any issues or concerns regarding her current medication regimen.  Patient denies any major depressive symptoms and states that she is just emotional.  Patient  endorses anxiety and rates her anxiety at 4 out of 10.  Patient reports that she plans on going on vacation on the anniversary of her husband's passing.  A GAD-7 screen was performed with the patient scoring a 15.  Patient is alert and oriented x4, calm, cooperative, and fully engaged in conversation during the encounter.  Patient endorses anxious mood.  Patient denies suicidal or homicidal ideations.  She further denies auditory or visual hallucinations and does not appear to be responding to internal/external stimuli.  Patient endorses good sleep and receives on average 7   Visit Diagnosis:    ICD-10-CM   1. Generalized anxiety disorder  F41.1 PARoxetine (PAXIL) 40 MG tablet    clonazePAM (KLONOPIN) 0.5 MG tablet    2. Bipolar depression ([employer])  F31.9 PARoxetine (PAXIL) 40 MG tablet    ARIPiprazole (ABILIFY) 5 MG tablet      Past Psychiatric History:  Generalized anxiety disorder Bipolar disorder  Past Medical History:  Past Medical History:  Diagnosis Date  . Anxiety   . Arthritis    Phreesia 03/08/2020  . Bladder infection   . Complication of anesthesia    Epidural did not work w/2nd preg. labor  . Depression    No med. currently, Stopped with + UPT  . Depression 06/27/2012  . Gonorrhea   . Hemorrhoids 06/27/2012  . Osteoarthritis 06/27/2012  . Osteoporosis    Phreesia 03/08/2020  . Panic disorder 06/27/2012  . Sleep apnea    Phreesia 03/08/2020    Past Surgical History:  Procedure Laterality Date  . BREAST MASS EXCISION     Benign  . BREAST SURGERY N/A  Phreesia 03/08/2020  . CESAREAN SECTION     X 1 1st preg., then VBAC  . CESAREAN SECTION N/A 01/27/2013   Procedure: CESAREAN SECTION;  Surgeon: Shelly Bombard, MD;  Location: [facility];  Service: Obstetrics;  Laterality: N/A;  . MOUTH SURGERY      Family Psychiatric History:  Mother - Major depressive disorder  Family History:  Family History  Problem Relation Age of Onset  . Cancer Mother   . Other Neg  Hx   . Alcohol abuse Neg Hx   . Arthritis Neg Hx   . Asthma Neg Hx   . Birth defects Neg Hx   . COPD Neg Hx   . Depression Neg Hx   . Drug abuse Neg Hx   . Diabetes Neg Hx   . Early death Neg Hx   . Hearing loss Neg Hx   . Heart disease Neg Hx   . Hyperlipidemia Neg Hx   . Hypertension Neg Hx   . Kidney disease Neg Hx   . Learning disabilities Neg Hx   . Mental illness Neg Hx   . Mental retardation Neg Hx   . Miscarriages / Stillbirths Neg Hx   . Stroke Neg Hx   . Vision loss Neg Hx     Social History:  Social History   Socioeconomic History  . Marital status: Widowed    Spouse name: Not on file  . Number of children: Not on file  . Years of education: Not on file  . Highest education level: Not on file  Occupational History  . Not on file  Tobacco Use  . Smoking status: Every Day    Packs/day: 0.25    Types: Cigarettes  . Smokeless tobacco: Never  Vaping Use  . Vaping Use: Never used  Substance and Sexual Activity  . Alcohol use: Yes    Comment: occ  . Drug use: No  . Sexual activity: Yes    Birth control/protection: None    Comment: Last intercourse at least 1 week ago  Other Topics Concern  . Not on file  Social History Narrative  . Not on file   Social Determinants of Health   Financial Resource Strain: Low Risk  (03/17/2021)   Overall Financial Resource Strain (CARDIA)   . Difficulty of Paying Living Expenses: Not hard at all  Food Insecurity: No Food Insecurity (03/17/2021)   Hunger Vital Sign   . Worried About Charity fundraiser in the Last Year: Never true   . Ran Out of Food in the Last Year: Never true  Transportation Needs: No Transportation Needs (03/17/2021)   PRAPARE - Transportation   . Lack of Transportation (Medical): No   . Lack of Transportation (Non-Medical): No  Physical Activity: Inactive (03/17/2021)   Exercise Vital Sign   . Days of Exercise per Week: 0 days   . Minutes of Exercise per Session: 0 min  Stress: Stress Concern  Present (03/17/2021)   [place]   . Feeling of Stress : Rather much  Social Connections: Moderately Isolated (03/17/2021)   Social Connection and Isolation Panel [NHANES]   . Frequency of Communication with Friends and Family: More than three times a week   . Frequency of Social Gatherings with Friends and Family: Twice a week   . Attends Religious Services: More than 4 times per year   . Active Member of Clubs or Organizations: No   . Attends Archivist  Meetings: Never   . Marital Status: Widowed    Allergies:  Allergies  Allergen Reactions  . Penicillins     Metabolic Disorder Labs: Lab Results  Component Value Date   HGBA1C 5.5 11/20/2021   No results found for: "PROLACTIN" Lab Results  Component Value Date   CHOL 177 01/23/2022   TRIG 95 01/23/2022   HDL 42 01/23/2022   CHOLHDL 4.2 01/23/2022   LDLCALC 117 (H) 01/23/2022   LDLCALC 130 (H) 01/26/2020   Lab Results  Component Value Date   TSH 0.921 01/23/2022   TSH 0.582 11/20/2021    Therapeutic Level Labs: No results found for: "LITHIUM" No results found for: "VALPROATE" No results found for: "CBMZ"  Current Medications: Current Outpatient Medications  Medication Sig Dispense Refill  . ARIPiprazole (ABILIFY) 5 MG tablet Take 1 tablet (5 mg total) by mouth daily. 30 tablet 2  . calcium carbonate (TUMS - DOSED IN MG ELEMENTAL CALCIUM) 500 MG chewable tablet Chew 1 tablet by mouth daily.    . clonazePAM (KLONOPIN) 0.5 MG tablet Take 1 tablet (0.5 mg total) by mouth 2 (two) times daily as needed for anxiety. 60 tablet 1  . cyclobenzaprine (FLEXERIL) 10 MG tablet Take 1 tablet (10 mg total) by mouth 3 (three) times daily as needed for muscle spasms. 30 tablet 0  . meloxicam (MOBIC) 15 MG tablet Take 1 tablet (15 mg total) by mouth daily. 30 tablet 0  . pantoprazole (PROTONIX) 40 MG tablet TAKE 1 TABLET BY MOUTH EVERY DAY 90 tablet 0  .  PARoxetine (PAXIL) 40 MG tablet Take 1 tablet (40 mg total) by mouth daily. 30 tablet 2  . PROAIR HFA 108 (90 Base) MCG/ACT inhaler INHALE 1-2 PUFFS BY MOUTH EVERY 6 HOURS AS NEEDED FOR WHEEZE OR SHORTNESS OF BREATH 8.5 each 1  . Vitamin D, Ergocalciferol, (DRISDOL) 1.25 MG (50000 UNIT) CAPS capsule TAKE 1 CAPSULE (50,000 UNITS TOTAL) BY MOUTH EVERY 7 (SEVEN) DAYS 4 capsule 2  . Vitamin D, Ergocalciferol, (DRISDOL) 1.25 MG (50000 UNIT) CAPS capsule Take 1 capsule (50,000 Units total) by mouth every 7 (seven) days. 12 capsule 0   No current facility-administered medications for this visit.     Musculoskeletal: Strength & Muscle Tone: Unable to assess due to telemedicine visit [place]: Unable to assess due to telemedicine visit Patient leans: Unable to assess due to telemedicine visit  Psychiatric Specialty Exam: Review of Systems  Psychiatric/Behavioral:  Negative for decreased concentration, dysphoric mood, hallucinations, self-injury, sleep disturbance and suicidal ideas. The patient is nervous/anxious. The patient is not hyperactive.     unknown if currently breastfeeding.There is no height or weight on file to calculate BMI.  General Appearance: Unable to assess due to telemedicine visit  Eye Contact:  Unable to assess due to telemedicine visit  Speech:  Clear and Coherent and Normal Rate  Volume:  Normal  Mood:  Anxious and Euthymic  Affect:  Appropriate and Congruent  Thought Process:  Coherent and Descriptions of Associations: Intact  Orientation:  Full (Time, Place, and Person)  Thought Content: WDL   Suicidal Thoughts:  No  Homicidal Thoughts:  No  Memory:  Immediate;   Good Recent;   Good Remote;   Good  Judgement:  Good  Insight:  Good  Psychomotor Activity:  Normal  Concentration:  Concentration: Good and Attention Span: Good  Recall:  Good  Fund of Knowledge: Good  Language: Good  Akathisia:  No  Handed:  Right  AIMS (if indicated):  not done  Assets:   Communication Skills Desire for Improvement Housing Social Support  ADL's:  Intact  Cognition: WNL  Sleep:  Good   Screenings: GAD-7    Flowsheet Row Video Visit from 01/31/2022 in [facility] Video Visit from 11/28/2021 in [facility] Video Visit from 09/27/2021 in [facility] Video Visit from 07/26/2021 in [facility] Video Visit from 05/25/2021 in [facility]  Total GAD-7 Score '15 16 12 13 15      '$ PHQ2-9    Flowsheet Row Video Visit from 01/31/2022 in [facility] Office Visit from 01/23/2022 in Primary Care at [facility] Video Visit from 11/28/2021 in [facility] Video Visit from 09/27/2021 in [facility] Office Visit from 07/31/2021 in [place]  PHQ-2 Total Score 0 0 0 0 0  PHQ-9 Total Score -- 2 -- -- 2      Flowsheet Row Video Visit from 01/31/2022 in [facility] Video Visit from 11/28/2021 in [facility] Admission (Discharged) from 10/28/2021 in [facility]  C-SSRS RISK CATEGORY No Risk No Risk No Risk        Assessment and Plan:   Lexi E. Banner is a 41 year old female with a past psychiatric history significant for generalized anxiety disorder and bipolar depression who presents to [facility] via virtual telephone visit for follow-up and medication management.  Patient reports that she has been experiencing anxiety related to stressors at work but denies depressive symptoms.  Patient to continue taking her medications as prescribed.  Patient's medication to be e-prescribed to pharmacy of choice.  Collaboration of Care: Collaboration of Care: Medication Management AEB provider managing patient's psychiatric  medications, Primary Care Provider AEB patient being followed by a primary care provider, Psychiatrist AEB patient being followed by mental health provider, and Other provider involved in patient's care AEB patient being followed by podiatry and cardiology  Patient/Guardian was advised Release of Information must be obtained prior to any record release in order to collaborate their care with an outside provider. Patient/Guardian was advised if they have not already done so to contact the registration department to sign all necessary forms in order for [place] to release information regarding their care.   Consent: Patient/Guardian gives verbal consent for treatment and assignment of benefits for services provided during this visit. Patient/Guardian expressed understanding and agreed to proceed.   1. Generalized anxiety disorder  - PARoxetine (PAXIL) 40 MG tablet; Take 1 tablet (40 mg total) by mouth daily.  Dispense: 30 tablet; Refill: 2 - clonazePAM (KLONOPIN) 0.5 MG tablet; Take 1 tablet (0.5 mg total) by mouth 2 (two) times daily as needed for anxiety.  Dispense: 60 tablet; Refill: 1  2. Bipolar depression ([place])  - PARoxetine (PAXIL) 40 MG tablet; Take 1 tablet (40 mg total) by mouth daily.  Dispense: 30 tablet; Refill: 2 - ARIPiprazole (ABILIFY) 5 MG tablet; Take 1 tablet (5 mg total) by mouth daily.  Dispense: 30 tablet; Refill: 2  Patient to follow up in 2 months Provider spent a total of 9 minutes with the patient/reviewing patient's chart  Malachy Mood, PA 02/03/2022, 10:19 AM

## 2022-02-12 ENCOUNTER — Encounter: Payer: Self-pay | Admitting: Family Medicine

## 2022-02-15 ENCOUNTER — Ambulatory Visit: Payer: Medicaid Other | Admitting: Family Medicine

## 2022-02-26 DIAGNOSIS — F3132 Bipolar disorder, current episode depressed, moderate: Secondary | ICD-10-CM | POA: Diagnosis not present

## 2022-03-03 ENCOUNTER — Other Ambulatory Visit: Payer: Self-pay | Admitting: Family Medicine

## 2022-03-05 NOTE — Telephone Encounter (Signed)
Requested medication (s) are due for refill today: yes  Requested medication (s) are on the active medication list: yes  Last refill:  01/26/22 #30 with 0 RF  Future visit scheduled: no, was given a curtesy refill when made appt then canceled appt, no upcoming appt scheduled.  Notes to clinic: This medication can not be delegated, Has already had a curtesy refill and there is no upcoming appointment scheduled (pt canceled).please assess.        Requested Prescriptions  Pending Prescriptions Disp Refills   cyclobenzaprine (FLEXERIL) 10 MG tablet [Pharmacy Med Name: CYCLOBENZAPRINE 10 MG TABLET] 30 tablet 0    Sig: TAKE 1 TABLET BY MOUTH THREE TIMES A DAY AS NEEDED FOR MUSCLE SPASMS     Not Delegated - Analgesics:  Muscle Relaxants Failed - 03/03/2022  9:44 PM      Failed - This refill cannot be delegated      Passed - Valid encounter within last 6 months    Recent Outpatient Visits           1 month ago Annual physical exam   Primary Care at [facility], MD   9 months ago Hypotension, unspecified hypotension type   Primary Care at [facility], MD   1 year ago Encounter to discuss test results   Primary Care at [facility], [place], [place]   1 year ago Encounter to establish care   Primary Care at [facility], [place], MD   1 year ago Bipolar depression [facility])   Primary Care at [facility], [place], [place]

## 2022-03-08 ENCOUNTER — Ambulatory Visit: Payer: Medicaid Other | Admitting: Internal Medicine

## 2022-03-09 ENCOUNTER — Other Ambulatory Visit: Payer: Medicaid Other

## 2022-03-22 ENCOUNTER — Other Ambulatory Visit: Payer: Self-pay

## 2022-03-22 ENCOUNTER — Other Ambulatory Visit: Payer: Self-pay | Admitting: Obstetrics and Gynecology

## 2022-03-22 ENCOUNTER — Ambulatory Visit
Admission: RE | Admit: 2022-03-22 | Discharge: 2022-03-22 | Disposition: A | Discharge status: Home / Self Care | Payer: Medicaid Other | Source: Ambulatory Visit | Attending: Obstetrics and Gynecology | Admitting: Obstetrics and Gynecology

## 2022-03-22 DIAGNOSIS — N6320 Unspecified lump in the left breast, unspecified quadrant: Secondary | ICD-10-CM

## 2022-03-22 DIAGNOSIS — N6310 Unspecified lump in the right breast, unspecified quadrant: Secondary | ICD-10-CM

## 2022-03-22 DIAGNOSIS — N6323 Unspecified lump in the left breast, lower outer quadrant: Secondary | ICD-10-CM | POA: Diagnosis not present

## 2022-03-22 DIAGNOSIS — G8929 Other chronic pain: Secondary | ICD-10-CM

## 2022-03-22 DIAGNOSIS — R928 Other abnormal and inconclusive findings on diagnostic imaging of breast: Secondary | ICD-10-CM

## 2022-03-22 DIAGNOSIS — N6324 Unspecified lump in the left breast, lower inner quadrant: Secondary | ICD-10-CM | POA: Diagnosis not present

## 2022-03-22 MED ORDER — CYCLOBENZAPRINE HCL 10 MG PO TABS: ORAL_TABLET | ORAL | 0 refills | Status: DC

## 2022-04-01 ENCOUNTER — Telehealth: Payer: Medicaid Other | Admitting: Nurse Practitioner

## 2022-04-01 ENCOUNTER — Encounter: Payer: Medicaid Other | Admitting: Nurse Practitioner

## 2022-04-01 DIAGNOSIS — N898 Other specified noninflammatory disorders of vagina: Secondary | ICD-10-CM

## 2022-04-01 NOTE — Patient Instructions (Signed)
  Alicia [name], thank you for joining Gildardo Pounds, NP for today's virtual visit.  While this provider is not your primary care provider (PCP), if your PCP is located in our provider database this encounter information will be shared with them immediately following your visit.  Consent: (Patient) Alicia [name] provided verbal consent for this virtual visit at the beginning of the encounter.  Current Medications:  Current Outpatient Medications:    ARIPiprazole (ABILIFY) 5 MG tablet, Take 1 tablet (5 mg total) by mouth daily., Disp: 30 tablet, Rfl: 2   calcium carbonate (TUMS - DOSED IN MG ELEMENTAL CALCIUM) 500 MG chewable tablet, Chew 1 tablet by mouth daily., Disp: , Rfl:    clonazePAM (KLONOPIN) 0.5 MG tablet, Take 1 tablet (0.5 mg total) by mouth 2 (two) times daily as needed for anxiety., Disp: 60 tablet, Rfl: 1   cyclobenzaprine (FLEXERIL) 10 MG tablet, TAKE 1 TABLET BY MOUTH THREE TIMES A DAY AS NEEDED FOR MUSCLE SPASMS, Disp: 30 tablet, Rfl: 0   meloxicam (MOBIC) 15 MG tablet, Take 1 tablet (15 mg total) by mouth daily., Disp: 30 tablet, Rfl: 0   pantoprazole (PROTONIX) 40 MG tablet, TAKE 1 TABLET BY MOUTH EVERY DAY, Disp: 90 tablet, Rfl: 0   PARoxetine (PAXIL) 40 MG tablet, Take 1 tablet (40 mg total) by mouth daily., Disp: 30 tablet, Rfl: 2   PROAIR HFA 108 (90 Base) MCG/ACT inhaler, INHALE 1-2 PUFFS BY MOUTH EVERY 6 HOURS AS NEEDED FOR WHEEZE OR SHORTNESS OF BREATH, Disp: 8.5 each, Rfl: 1   Vitamin D, Ergocalciferol, (DRISDOL) 1.25 MG (50000 UNIT) CAPS capsule, TAKE 1 CAPSULE (50,000 UNITS TOTAL) BY MOUTH EVERY 7 (SEVEN) DAYS, Disp: 4 capsule, Rfl: 2   Vitamin D, Ergocalciferol, (DRISDOL) 1.25 MG (50000 UNIT) CAPS capsule, Take 1 capsule (50,000 Units total) by mouth every 7 (seven) days., Disp: 12 capsule, Rfl: 0   Medications ordered in this encounter:  No orders of the defined types were placed in this encounter.    *If you need refills on other medications prior to your  next appointment, please contact your pharmacy*  Follow-Up: Call back or seek an in-person evaluation if the symptoms worsen or if the condition fails to improve as anticipated.  Other Instructions Follow up in office with PCP for vaginal exam Ice packs to labia for comfort   If you have been instructed to have an in-person evaluation today at a local Urgent Care facility, please use the link below. It will take you to a list of all of our available [facility], including address, phone number and hours of operation. Please do not delay care.  [facility]  If you or a family member do not have a primary care provider, use the link below to schedule a visit and establish care. When you choose a [facility] primary care physician or advanced practice provider, you gain a long-term partner in health. Find a Primary Care Provider  Learn more about [facility]'s in-office and virtual care options: [facility]

## 2022-04-01 NOTE — Progress Notes (Signed)
Completed virtual visit for same concern today

## 2022-04-01 NOTE — Progress Notes (Signed)
Virtual Visit Consent   Alicia [name], you are scheduled for a virtual visit with a [facility] provider today. Just as with appointments in the office, your consent must be obtained to participate. Your consent will be active for this visit and any virtual visit you may have with one of our providers in the next 365 days. If you have a MyChart account, a copy of this consent can be sent to you electronically.  As this is a virtual visit, video technology does not allow for your provider to perform a traditional examination. This may limit your provider's ability to fully assess your condition. If your provider identifies any concerns that need to be evaluated in person or the need to arrange testing (such as labs, EKG, etc.), we will make arrangements to do so. Although advances in technology are sophisticated, we cannot ensure that it will always work on either your end or our end. If the connection with a video visit is poor, the visit may have to be switched to a telephone visit. With either a video or telephone visit, we are not always able to ensure that we have a secure connection.  By engaging in this virtual visit, you consent to the provision of healthcare and authorize for your insurance to be billed (if applicable) for the services provided during this visit. Depending on your insurance coverage, you may receive a charge related to this service.  I need to obtain your verbal consent now. Are you willing to proceed with your visit today? Nickia E Guiffre has provided verbal consent on 04/01/2022 for a virtual visit (video or telephone). Gildardo Pounds, NP  Date: 04/01/2022 6:10 PM  Virtual Visit via Video Note   I, Gildardo Pounds, connected with  Alicia [name]  ([id number], [birth year]) on 04/01/22 at  6:00 PM EDT by a video-enabled telemedicine application and verified that I am speaking with the correct person using two identifiers.  Location: Patient: Virtual Visit Location Patient:  Home Provider: Virtual Visit Location Provider: Home Office   I discussed the limitations of evaluation and management by telemedicine and the availability of in person appointments. The patient expressed understanding and agreed to proceed.    History of Present Illness: Alicia [name] is a 41 y.o. who identifies as a female who was assigned female at birth, and is being seen today for vaginal pain and swelling.  Notes onset of vaginal pain and swelling over the past few days. Denies vaginal odor, abnormal vaginal discharge, pelvic pain or dysuria. 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   Class 3 severe obesity due to excess calories without serious comorbidity with body mass index (BMI) of 40.0 to 44.9 in adult [facility]) 04/19/2021   Vitamin D deficiency 01/04/2021   Abnormal liver enzymes 01/04/2021   OSA (obstructive sleep apnea) 06/21/2020   Generalized anxiety disorder 04/26/2020   Bipolar depression ([place]) 04/26/2020   GERD without esophagitis 01/22/2013    Allergies:  Allergies  Allergen Reactions   Penicillins    Medications:  Current Outpatient Medications:    ARIPiprazole (ABILIFY) 5 MG tablet, Take 1 tablet (5 mg total) by mouth daily., Disp: 30 tablet, Rfl: 2   calcium carbonate (TUMS - DOSED IN MG ELEMENTAL CALCIUM) 500 MG chewable tablet, Chew 1 tablet by  mouth daily., Disp: , Rfl:    clonazePAM (KLONOPIN) 0.5 MG tablet, Take 1 tablet (0.5 mg total) by mouth 2 (two) times daily as needed for anxiety., Disp: 60 tablet, Rfl: 1   cyclobenzaprine (FLEXERIL) 10 MG tablet, TAKE 1 TABLET BY MOUTH THREE TIMES A DAY AS NEEDED FOR MUSCLE SPASMS, Disp: 30 tablet, Rfl: 0   meloxicam (MOBIC) 15 MG tablet, Take 1 tablet (15 mg total) by mouth daily., Disp: 30 tablet, Rfl: 0   pantoprazole  (PROTONIX) 40 MG tablet, TAKE 1 TABLET BY MOUTH EVERY DAY, Disp: 90 tablet, Rfl: 0   PARoxetine (PAXIL) 40 MG tablet, Take 1 tablet (40 mg total) by mouth daily., Disp: 30 tablet, Rfl: 2   PROAIR HFA 108 (90 Base) MCG/ACT inhaler, INHALE 1-2 PUFFS BY MOUTH EVERY 6 HOURS AS NEEDED FOR WHEEZE OR SHORTNESS OF BREATH, Disp: 8.5 each, Rfl: 1   Vitamin D, Ergocalciferol, (DRISDOL) 1.25 MG (50000 UNIT) CAPS capsule, TAKE 1 CAPSULE (50,000 UNITS TOTAL) BY MOUTH EVERY 7 (SEVEN) DAYS, Disp: 4 capsule, Rfl: 2   Vitamin D, Ergocalciferol, (DRISDOL) 1.25 MG (50000 UNIT) CAPS capsule, Take 1 capsule (50,000 Units total) by mouth every 7 (seven) days., Disp: 12 capsule, Rfl: 0  Observations/Objective: Patient is well-developed, well-nourished in no acute distress.  Resting comfortably at home.  Head is normocephalic, atraumatic.  No labored breathing.  Speech is clear and coherent with logical content.  Patient is alert and oriented at baseline.    Assessment and Plan: 1. Vaginal irritation Follow up in office with PCP for vaginal exam Ice packs to labia for comfort  Follow Up Instructions: I discussed the assessment and treatment plan with the patient. The patient was provided an opportunity to ask questions and all were answered. The patient agreed with the plan and demonstrated an understanding of the instructions.  A copy of instructions were sent to the patient via MyChart unless otherwise noted below.     The patient was advised to call back or seek an in-person evaluation if the symptoms worsen or if the condition fails to improve as anticipated.  Time:  I spent 10 minutes with the patient via telehealth technology discussing the above problems/concerns.    Gildardo Pounds, NP

## 2022-04-03 ENCOUNTER — Encounter (HOSPITAL_COMMUNITY): Payer: Self-pay | Admitting: Physician Assistant

## 2022-04-03 ENCOUNTER — Ambulatory Visit
Admission: EM | Admit: 2022-04-03 | Discharge: 2022-04-03 | Disposition: A | Discharge status: Home / Self Care | Payer: Medicaid Other | Attending: Physician Assistant | Admitting: Physician Assistant

## 2022-04-03 ENCOUNTER — Encounter: Payer: Self-pay | Admitting: Emergency Medicine

## 2022-04-03 ENCOUNTER — Telehealth (INDEPENDENT_AMBULATORY_CARE_PROVIDER_SITE_OTHER): Payer: Medicaid Other | Admitting: Physician Assistant

## 2022-04-03 DIAGNOSIS — Z113 Encounter for screening for infections with a predominantly sexual mode of transmission: Secondary | ICD-10-CM

## 2022-04-03 DIAGNOSIS — F319 Bipolar disorder, unspecified: Secondary | ICD-10-CM

## 2022-04-03 DIAGNOSIS — F411 Generalized anxiety disorder: Secondary | ICD-10-CM | POA: Diagnosis not present

## 2022-04-03 MED ORDER — PAROXETINE HCL 40 MG PO TABS: 40.0000 mg | ORAL_TABLET | Freq: Every day | ORAL | 2 refills | Status: AC

## 2022-04-03 MED ORDER — CLONAZEPAM 0.5 MG PO TABS: 0.5000 mg | ORAL_TABLET | Freq: Two times a day (BID) | ORAL | 1 refills | Status: DC | PRN

## 2022-04-03 MED ORDER — ARIPIPRAZOLE 5 MG PO TABS: 5.0000 mg | ORAL_TABLET | Freq: Every day | ORAL | 2 refills | Status: DC

## 2022-04-03 NOTE — ED Provider Notes (Signed)
[facility]    CSN: [id number] Arrival date & time: 04/03/22  0912      History   Chief Complaint Chief Complaint  Patient presents with   Groin Swelling    HPI Alicia [name] is a 41 y.o. female.   Patient here today for evaluation of swelling and irritation to her genital area after having intercourse recently. She denies discharge. She reports symptoms have improved somewhat. She would like STD screening.   The history is provided by the patient.    Past Medical History:  Diagnosis Date   Anxiety    Arthritis    Phreesia 03/08/2020   Bladder infection    Complication of anesthesia    Epidural did not work w/2nd preg. labor   Depression    No med. 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   Class 3 severe obesity due to excess calories without serious comorbidity with body mass index (BMI) of 40.0 to 44.9 in adult [facility]) 04/19/2021   Vitamin D deficiency 01/04/2021   Abnormal liver enzymes 01/04/2021   OSA (obstructive sleep apnea) 06/21/2020   Generalized anxiety disorder 04/26/2020   Bipolar depression ([place]) 04/26/2020   GERD without esophagitis 01/22/2013    Past Surgical History:  Procedure Laterality Date   BREAST MASS EXCISION     Benign   BREAST SURGERY N/A    Phreesia 03/08/2020   CESAREAN SECTION     X 1 1st preg., then VBAC   CESAREAN SECTION N/A 01/27/2013   Procedure: CESAREAN SECTION;  Surgeon: Shelly Bombard, MD;  Location: [facility];  Service: Obstetrics;  Laterality: N/A;   MOUTH SURGERY      OB History     Gravida  4   Para  3   Term  2   Preterm  1   AB      Living  3      SAB       IAB      Ectopic      Multiple      Live Births  1            Home Medications    Prior to Admission medications   Medication Sig Start Date End Date Taking? Authorizing Provider  ARIPiprazole (ABILIFY) 5 MG tablet Take 1 tablet (5 mg total) by mouth daily. 02/01/22   Nwoko, Terese Door, PA  calcium carbonate (TUMS - DOSED IN MG ELEMENTAL CALCIUM) 500 MG chewable tablet Chew 1 tablet by mouth daily.    [provider]  clonazePAM (KLONOPIN) 0.5 MG tablet Take 1 tablet (0.5 mg total) by mouth 2 (two) times daily as needed for anxiety. 02/01/22   Nwoko, Terese Door, PA  cyclobenzaprine (FLEXERIL) 10 MG tablet TAKE 1 TABLET BY MOUTH THREE TIMES A DAY AS NEEDED FOR MUSCLE SPASMS 03/22/22   Dorna Mai, MD  meloxicam (MOBIC) 15 MG tablet Take 1 tablet (15 mg total) by mouth daily. 01/26/22   Dorna Mai, MD  pantoprazole (PROTONIX) 40 MG tablet TAKE 1 TABLET BY MOUTH EVERY DAY 08/03/21   Dorna Mai, MD  PARoxetine (PAXIL) 40 MG tablet  Take 1 tablet (40 mg total) by mouth daily. 02/01/22   Nwoko, Terese Door, PA  PROAIR HFA 108 (90 Base) MCG/ACT inhaler INHALE 1-2 PUFFS BY MOUTH EVERY 6 HOURS AS NEEDED FOR WHEEZE OR SHORTNESS OF BREATH 07/25/21   Dorna Mai, MD  Vitamin D, Ergocalciferol, (DRISDOL) 1.25 MG (50000 UNIT) CAPS capsule TAKE 1 CAPSULE (50,000 UNITS TOTAL) BY MOUTH EVERY 7 (SEVEN) DAYS 03/30/21   Mayers, Cari S, PA-C  Vitamin D, Ergocalciferol, (DRISDOL) 1.25 MG (50000 UNIT) CAPS capsule Take 1 capsule (50,000 Units total) by mouth every 7 (seven) days. 01/26/22   Dorna Mai, MD    Family History Family History  Problem Relation Age of Onset   Cancer Mother    Other Neg Hx    Alcohol abuse Neg Hx    Arthritis Neg Hx    Asthma Neg Hx    Birth defects Neg Hx    COPD Neg Hx    Depression Neg Hx    Drug abuse Neg Hx    Diabetes Neg Hx    Early death Neg Hx    Hearing loss Neg Hx    Heart disease Neg Hx    Hyperlipidemia Neg Hx    Hypertension Neg Hx     Kidney disease Neg Hx    Learning disabilities Neg Hx    Mental illness Neg Hx    Mental retardation Neg Hx    Miscarriages / Stillbirths Neg Hx    Stroke Neg Hx    Vision loss Neg Hx     Social History Social History   Tobacco Use   Smoking status: Every Day    Packs/day: 0.25    Types: Cigarettes   Smokeless tobacco: Never  Vaping Use   Vaping Use: Never used  Substance Use Topics   Alcohol use: Yes    Comment: occ   Drug use: No     Allergies   Penicillins   Review of Systems Review of Systems  Constitutional:  Negative for chills and fever.  Eyes:  Negative for discharge and redness.  Gastrointestinal:  Negative for abdominal pain, nausea and vomiting.  Genitourinary:  Negative for vaginal bleeding and vaginal discharge.     Physical Exam Triage Vital Signs ED Triage Vitals  Enc Vitals Group     BP --      Pulse Rate 04/03/22 0922 61     Resp 04/03/22 0922 17     Temp 04/03/22 0922 98 F (36.7 C)     Temp src --      SpO2 04/03/22 0922 98 %     Weight --      Height --      Head Circumference --      Peak Flow --      Pain Score 04/03/22 0924 0     Pain Loc --      Pain Edu? --      Excl. in [place]? --    No data found.  Updated Vital Signs Pulse 61   Temp 98 F (36.7 C)   Resp 17   LMP  (LMP Unknown)   SpO2 98%   Breastfeeding No      Physical Exam Vitals and nursing note reviewed.  Constitutional:      General: She is not in acute distress.    Appearance: Normal appearance. She is not ill-appearing.  HENT:     Head: Normocephalic and atraumatic.  Eyes:     Conjunctiva/sclera: Conjunctivae normal.  Cardiovascular:  Rate and Rhythm: Normal rate.  Pulmonary:     Effort: Pulmonary effort is normal.  Genitourinary:    Comments: No appreciated genital rashes, erythema, swelling Neurological:     Mental Status: She is alert.  Psychiatric:        Mood and Affect: Mood normal.        Behavior: Behavior normal.        Thought  Content: Thought content normal.      UC Treatments / Results  Labs (all labs ordered are listed, but only abnormal results are displayed) Labs Reviewed  CERVICOVAGINAL ANCILLARY ONLY    EKG   Radiology No results found.  Procedures Procedures (including critical care time)  Medications Ordered in UC Medications - No data to display  Initial Impression / Assessment and Plan / UC Course  I have reviewed the triage vital signs and the nursing notes.  Pertinent labs & imaging results that were available during my care of the patient were reviewed by me and considered in my medical decision making (see chart for details).    STD screening ordered. Will await results for further recommendation. Encouraged follow up with any further concerns.   Final Clinical Impressions(s) / UC Diagnoses   Final diagnoses:  Screening for STD (sexually transmitted disease)   Discharge Instructions   None    ED Prescriptions   None    PDMP not reviewed this encounter.   Francene Finders, PA-C 04/03/22 1044

## 2022-04-03 NOTE — ED Triage Notes (Addendum)
Pt is present today with c/o labia swelling. Pt states she noticed her discomfort x2 days ago. Pt would like to be tested for STD.

## 2022-04-03 NOTE — Progress Notes (Unsigned)
[place] MD/PA/NP OP Progress Note  Virtual Visit via Telephone Note  I connected with Alicia [name] on 04/04/22 at  4:00 PM EDT by telephone and verified that I am speaking with the correct person using two identifiers.  Location: Patient: Home Provider: Clinic   I discussed the limitations, risks, security and privacy concerns of performing an evaluation and management service by telephone and the availability of in person appointments. I also discussed with the patient that there may be a patient responsible charge related to this service. The patient expressed understanding and agreed to proceed.  Follow Up Instructions:  I discussed the assessment and treatment plan with the patient. The patient was provided an opportunity to ask questions and all were answered. The patient agreed with the plan and demonstrated an understanding of the instructions.   The patient was advised to call back or seek an in-person evaluation if the symptoms worsen or if the condition fails to improve as anticipated.  I provided 9 minutes of non-face-to-face time during this encounter.  Malachy Mood, PA    04/04/2022 8:27 PM Alicia [name]  MRN:  [id number]  Chief Complaint:  Chief Complaint  Patient presents with   Follow-up   Medication Refill   HPI:  Alicia [name] is a 41 year old female with a past psychiatric history significant for generalized anxiety disorder and bipolar depression who presents to [facility] via virtual telephone visit for follow-up and medication management.  Patient is currently being managed on the following medications:  Paroxetine 40 mg daily Abilify 5 mg daily Clonazepam 0.5 mg 2 times daily as needed  Patient reports that she has been in deep thought regarding her current relationship.  She reports that she no longer wants to be in her relationship due to her current partner not being able to express himself.  Despite her  feelings towards her relationship, patient states that her mood has been overall good.  She states that everything has been going well for her and that work has also been fine.  Patient denies experiencing depression.  She further denies anxiety but states that she had a spell last week where her anxiety was elevated due to stressors in her life.  Patient denies any other issues at this time.  A GAD-7 screen was performed with the patient scoring a 7.  Patient is alert and oriented x4, calm, cooperative, and fully engaged in conversation during the encounter.  Patient endorses good mood.  Patient denies suicidal or homicidal ideations.  She further denies auditory or visual hallucinations and does not appear to be responding to internal/external stimuli.  Patient endorses good sleep and receives on average 6 to 7 hours of sleep each night.  Patient endorses good appetite and eats on average 1-2 meals per day.  Patient endorses alcohol consumption sparingly.  Patient endorses tobacco use and smokes on average 10 cigarettes/day.  Patient denies illicit drug use.  Visit Diagnosis:    ICD-10-CM   1. Generalized anxiety disorder  F41.1 PARoxetine (PAXIL) 40 MG tablet    clonazePAM (KLONOPIN) 0.5 MG tablet    2. Bipolar depression ([place])  F31.9 PARoxetine (PAXIL) 40 MG tablet    ARIPiprazole (ABILIFY) 5 MG tablet      Past Psychiatric History:  Generalized anxiety disorder Bipolar disorder  Past Medical History:  Past Medical History:  Diagnosis Date   Anxiety    Arthritis    Phreesia 03/08/2020   Bladder infection  Complication of anesthesia    Epidural did not work w/2nd preg. labor   Depression    No med. currently, Stopped with + UPT   Depression 06/27/2012   Gonorrhea    Hemorrhoids 06/27/2012   Osteoarthritis 06/27/2012   Osteoporosis    Phreesia 03/08/2020   Panic disorder 06/27/2012   Sleep apnea    Phreesia 03/08/2020    Past Surgical History:  Procedure Laterality Date    BREAST MASS EXCISION     Benign   BREAST SURGERY N/A    Phreesia 03/08/2020   CESAREAN SECTION     X 1 1st preg., then VBAC   CESAREAN SECTION N/A 01/27/2013   Procedure: CESAREAN SECTION;  Surgeon: Shelly Bombard, MD;  Location: [facility];  Service: Obstetrics;  Laterality: N/A;   MOUTH SURGERY      Family Psychiatric History:  Mother - Major depressive disorder  Family History:  Family History  Problem Relation Age of Onset   Cancer Mother    Other Neg Hx    Alcohol abuse Neg Hx    Arthritis Neg Hx    Asthma Neg Hx    Birth defects Neg Hx    COPD Neg Hx    Depression Neg Hx    Drug abuse Neg Hx    Diabetes Neg Hx    Early death Neg Hx    Hearing loss Neg Hx    Heart disease Neg Hx    Hyperlipidemia Neg Hx    Hypertension Neg Hx    Kidney disease Neg Hx    Learning disabilities Neg Hx    Mental illness Neg Hx    Mental retardation Neg Hx    Miscarriages / Stillbirths Neg Hx    Stroke Neg Hx    Vision loss Neg Hx     Social History:  Social History   Socioeconomic History   Marital status: Widowed    Spouse name: Not on file   Number of children: Not on file   Years of education: Not on file   Highest education level: Not on file  Occupational History   Not on file  Tobacco Use   Smoking status: Every Day    Packs/day: 0.25    Types: Cigarettes   Smokeless tobacco: Never  Vaping Use   Vaping Use: Never used  Substance and Sexual Activity   Alcohol use: Yes    Comment: occ   Drug use: No   Sexual activity: Yes    Birth control/protection: None    Comment: Last intercourse at least 1 week ago  Other Topics Concern   Not on file  Social History Narrative   Not on file   Social Determinants of Health   Financial Resource Strain: Low Risk  (03/17/2021)   Overall Financial Resource Strain (CARDIA)    Difficulty of Paying Living Expenses: Not hard at all  Food Insecurity: No Food Insecurity (03/17/2021)   Hunger Vital Sign    Worried About Running Out  of Food in the Last Year: Never true    [place] in the Last Year: Never true  Transportation Needs: No Transportation Needs (03/17/2021)   PRAPARE - Hydrologist (Medical): No    Lack of Transportation (Non-Medical): No  Physical Activity: Inactive (03/17/2021)   Exercise Vital Sign    Days of Exercise per Week: 0 days    Minutes of Exercise per Session: 0 min  Stress: Stress Concern Present (03/17/2021)  [place] Questionnaire    Feeling of Stress : Rather much  Social Connections: Moderately Isolated (03/17/2021)   Social Connection and Isolation Panel [NHANES]    Frequency of Communication with Friends and Family: More than three times a week    Frequency of Social Gatherings with Friends and Family: Twice a week    Attends Religious Services: More than 4 times per year    Active Member of [employer] or Organizations: No    Attends Archivist Meetings: Never    Marital Status: Widowed    Allergies:  Allergies  Allergen Reactions   Penicillins     Metabolic Disorder Labs: Lab Results  Component Value Date   HGBA1C 5.5 11/20/2021   No results found for: "PROLACTIN" Lab Results  Component Value Date   CHOL 177 01/23/2022   TRIG 95 01/23/2022   HDL 42 01/23/2022   CHOLHDL 4.2 01/23/2022   LDLCALC 117 (H) 01/23/2022   LDLCALC 130 (H) 01/26/2020   Lab Results  Component Value Date   TSH 0.921 01/23/2022   TSH 0.582 11/20/2021    Therapeutic Level Labs: No results found for: "LITHIUM" No results found for: "VALPROATE" No results found for: "CBMZ"  Current Medications: Current Outpatient Medications  Medication Sig Dispense Refill   ARIPiprazole (ABILIFY) 5 MG tablet Take 1 tablet (5 mg total) by mouth daily. 30 tablet 2   calcium carbonate (TUMS - DOSED IN MG ELEMENTAL CALCIUM) 500 MG chewable tablet Chew 1 tablet by mouth daily.     clonazePAM (KLONOPIN) 0.5 MG tablet  Take 1 tablet (0.5 mg total) by mouth 2 (two) times daily as needed for anxiety. 60 tablet 1   cyclobenzaprine (FLEXERIL) 10 MG tablet TAKE 1 TABLET BY MOUTH THREE TIMES A DAY AS NEEDED FOR MUSCLE SPASMS 30 tablet 0   meloxicam (MOBIC) 15 MG tablet Take 1 tablet (15 mg total) by mouth daily. 30 tablet 0   pantoprazole (PROTONIX) 40 MG tablet TAKE 1 TABLET BY MOUTH EVERY DAY 90 tablet 0   PARoxetine (PAXIL) 40 MG tablet Take 1 tablet (40 mg total) by mouth daily. 30 tablet 2   PROAIR HFA 108 (90 Base) MCG/ACT inhaler INHALE 1-2 PUFFS BY MOUTH EVERY 6 HOURS AS NEEDED FOR WHEEZE OR SHORTNESS OF BREATH 8.5 each 1   Vitamin D, Ergocalciferol, (DRISDOL) 1.25 MG (50000 UNIT) CAPS capsule TAKE 1 CAPSULE (50,000 UNITS TOTAL) BY MOUTH EVERY 7 (SEVEN) DAYS 4 capsule 2   Vitamin D, Ergocalciferol, (DRISDOL) 1.25 MG (50000 UNIT) CAPS capsule Take 1 capsule (50,000 Units total) by mouth every 7 (seven) days. 12 capsule 0   No current facility-administered medications for this visit.     Musculoskeletal: Strength & Muscle Tone: Unable to assess due to telemedicine visit [place]: Unable to assess due to telemedicine visit Patient leans: Unable to assess due to telemedicine visit  Psychiatric Specialty Exam: Review of Systems  Psychiatric/Behavioral:  Negative for decreased concentration, dysphoric mood, hallucinations, self-injury, sleep disturbance and suicidal ideas. The patient is not nervous/anxious and is not hyperactive.     There were no vitals taken for this visit.There is no height or weight on file to calculate BMI.  General Appearance: Unable to assess due to telemedicine visit  Eye Contact:  Unable to assess due to telemedicine visit  Speech:  Clear and Coherent and Normal Rate  Volume:  Normal  Mood:  Anxious and Euthymic  Affect:  Appropriate and Congruent  Thought  Process:  Coherent and Descriptions of Associations: Intact  Orientation:  Full (Time, Place, and Person)  Thought  Content: WDL   Suicidal Thoughts:  No  Homicidal Thoughts:  No  Memory:  Immediate;   Good Recent;   Good Remote;   Good  Judgement:  Good  Insight:  Good  Psychomotor Activity:  Normal  Concentration:  Concentration: Good and Attention Span: Good  Recall:  Good  Fund of Knowledge: Good  Language: Good  Akathisia:  No  Handed:  Right  AIMS (if indicated): not done  Assets:  Communication Skills Desire for Improvement Housing Social Support  ADL's:  Intact  Cognition: WNL  Sleep:  Good   Screenings: GAD-7    Flowsheet Row Video Visit from 04/03/2022 in [facility] Video Visit from 01/31/2022 in [facility] Video Visit from 11/28/2021 in [facility] Video Visit from 09/27/2021 in [facility] Video Visit from 07/26/2021 in [facility]  Total GAD-7 Score '7 15 16 12 13      '$ PHQ2-9    Flowsheet Row Video Visit from 04/03/2022 in [facility] Video Visit from 01/31/2022 in [facility] Office Visit from 01/23/2022 in [facility] at [facility] Video Visit from 11/28/2021 in [facility] Video Visit from 09/27/2021 in [facility]  PHQ-2 Total Score 0 0 0 0 0  PHQ-9 Total Score -- -- 2 -- --      Flowsheet Row ED from 04/03/2022 in [facility]  Video Visit from 01/31/2022 in [facility] Video Visit from 11/28/2021 in [place] No Risk No Risk No Risk        Assessment and Plan:   Amisadai E. Roylance is a 41 year old female with a past psychiatric history significant for generalized anxiety disorder and bipolar depression who presents to [facility] via virtual telephone visit for follow-up and  medication management.  Patient presents today with stable mood.  She denies depression or anxiety at this time and continues to be stable on her current medication regimen.  Patient to continue taking her medications as prescribed.  Patient's medications to be prescribed to pharmacy of choice.  Collaboration of Care: Collaboration of Care: Medication Management AEB provider managing patient's psychiatric medications, Primary Care Provider AEB patient being followed by a primary care provider, Psychiatrist AEB patient being followed by mental health provider, and Other provider involved in patient's care AEB patient being followed by podiatry and cardiology  Patient/Guardian was advised Release of Information must be obtained prior to any record release in order to collaborate their care with an outside provider. Patient/Guardian was advised if they have not already done so to contact the registration department to sign all necessary forms in order for [place] to release information regarding their care.   Consent: Patient/Guardian gives verbal consent for treatment and assignment of benefits for services provided during this visit. Patient/Guardian expressed understanding and agreed to proceed.   1. Generalized anxiety disorder  - PARoxetine (PAXIL) 40 MG tablet; Take 1 tablet (40 mg total) by mouth daily.  Dispense: 30 tablet; Refill: 2 - clonazePAM (KLONOPIN) 0.5 MG tablet; Take 1 tablet (0.5 mg total) by mouth 2 (two) times daily as needed for anxiety.  Dispense: 60 tablet; Refill: 1  2. Bipolar  depression ([place])  - PARoxetine (PAXIL) 40 MG tablet; Take 1 tablet (40 mg total) by mouth daily.  Dispense: 30 tablet; Refill: 2 - ARIPiprazole (ABILIFY) 5 MG tablet; Take 1 tablet (5 mg total) by mouth daily.  Dispense: 30 tablet; Refill: 2  Patient to follow up in 2 months Provider spent a total of 9 minutes with the patient/reviewing patient's chart  Malachy Mood, PA 04/04/2022, 8:27 PM

## 2022-04-04 ENCOUNTER — Telehealth (HOSPITAL_COMMUNITY): Payer: Self-pay | Admitting: Emergency Medicine

## 2022-04-04 ENCOUNTER — Ambulatory Visit
Admission: EM | Admit: 2022-04-04 | Discharge: 2022-04-04 | Disposition: A | Discharge status: Home / Self Care | Payer: Medicaid Other

## 2022-04-04 ENCOUNTER — Ambulatory Visit
Admission: EM | Admit: 2022-04-04 | Discharge: 2022-04-04 | Disposition: A | Discharge status: Home / Self Care | Payer: Medicaid Other | Attending: Internal Medicine | Admitting: Internal Medicine

## 2022-04-04 DIAGNOSIS — B9689 Other specified bacterial agents as the cause of diseases classified elsewhere: Secondary | ICD-10-CM | POA: Diagnosis not present

## 2022-04-04 DIAGNOSIS — Z113 Encounter for screening for infections with a predominantly sexual mode of transmission: Secondary | ICD-10-CM | POA: Diagnosis not present

## 2022-04-04 DIAGNOSIS — N760 Acute vaginitis: Secondary | ICD-10-CM | POA: Diagnosis not present

## 2022-04-04 LAB — CERVICOVAGINAL ANCILLARY ONLY
Comment: NEGATIVE
Comment: NEGATIVE
Comment: NEGATIVE
Comment: NEGATIVE
Comment: NEGATIVE
Comment: NORMAL

## 2022-04-04 NOTE — Telephone Encounter (Signed)
Patient will need to return for recollect of cyto swab due to missing fluid Reviewed with patient, she verbalized understanding

## 2022-04-04 NOTE — ED Triage Notes (Signed)
Pt presents for cytology recollect from yesterday.

## 2022-04-05 LAB — CERVICOVAGINAL ANCILLARY ONLY
Bacterial Vaginitis (gardnerella): POSITIVE — AB
Candida Glabrata: NEGATIVE
Candida Vaginitis: NEGATIVE
Chlamydia: NEGATIVE
Comment: NEGATIVE
Comment: NEGATIVE
Comment: NEGATIVE
Comment: NEGATIVE
Comment: NEGATIVE
Comment: NORMAL
Neisseria Gonorrhea: NEGATIVE
Trichomonas: NEGATIVE

## 2022-04-06 ENCOUNTER — Telehealth (HOSPITAL_COMMUNITY): Payer: Self-pay | Admitting: Emergency Medicine

## 2022-04-06 MED ORDER — METRONIDAZOLE 0.75 % VA GEL: 1.0000 | Freq: Every day | VAGINAL | 0 refills | Status: AC

## 2022-04-23 ENCOUNTER — Other Ambulatory Visit: Payer: Self-pay | Admitting: Family Medicine

## 2022-04-23 DIAGNOSIS — G8929 Other chronic pain: Secondary | ICD-10-CM

## 2022-04-24 NOTE — Telephone Encounter (Signed)
Requested medication (s) are due for refill today - yes  Requested medication (s) are on the active medication list -yes  Future visit scheduled -no  Last refill: 03/22/22 #30  Notes to clinic: non delegated Rx  Requested Prescriptions  Pending Prescriptions Disp Refills   cyclobenzaprine (FLEXERIL) 10 MG tablet [Pharmacy Med Name: CYCLOBENZAPRINE 10 MG TABLET] 30 tablet 0    Sig: TAKE 1 TABLET BY MOUTH THREE TIMES A DAY AS NEEDED FOR MUSCLE SPASM     Not Delegated - Analgesics:  Muscle Relaxants Failed - 04/23/2022  8:39 PM      Failed - This refill cannot be delegated      Passed - Valid encounter within last 6 months    Recent Outpatient Visits           3 months ago Annual physical exam   Primary Care at [facility], [place]   11 months ago Hypotension, unspecified hypotension type   [facility] at [facility], [place]   1 year ago Encounter to discuss test results   Primary Care at [facility], [facility] Sage, [place]   1 year ago Encounter to establish care   Primary Care at [facility], Bayard Beaver, MD   2 years ago Bipolar depression [facility])   Primary Care at [facility], Loraine Grip, PA-C       Future Appointments             In 1 month Ali Lowe, Arlie Solomons, MD [place]. [facility], [facility]               Requested Prescriptions  Pending Prescriptions Disp Refills   cyclobenzaprine (FLEXERIL) 10 MG tablet [Pharmacy Med Name: CYCLOBENZAPRINE 10 MG TABLET] 30 tablet 0    Sig: TAKE 1 TABLET BY MOUTH THREE TIMES A DAY AS NEEDED FOR MUSCLE SPASM     Not Delegated - Analgesics:  Muscle Relaxants Failed - 04/23/2022  8:39 PM      Failed - This refill cannot be delegated      Passed - Valid encounter within last 6 months    Recent Outpatient Visits           3 months ago Annual physical exam   Primary Care at [facility], MD   11 months ago  Hypotension, unspecified hypotension type   Primary Care at [facility], [place]   1 year ago Encounter to discuss test results   Primary Care at [facility], [facility] Sage, [place]   1 year ago Encounter to establish care   Primary Care at [facility], Bayard Beaver, [place]   2 years ago Bipolar depression [facility])   Primary Care at [facility], Loraine Grip, PA-C       Future Appointments             In 1 month Ali Lowe, Arlie Solomons, MD [place]. [facility], [facility]

## 2022-05-14 IMAGING — US US BREAST*L* LIMITED INC AXILLA
1 series · 9 of 9 positions shown · non-contrast
Comparison: Mammogram dated 08/03/2021.

CLINICAL DATA: Screening recall from baseline mammography for
bilateral breast masses. History of prior benign right breast
excision.



[Series 1: us breast*left* limited inc axilla · 0.07mm/px · 9 of 9 slices shown]
[im 1/9]
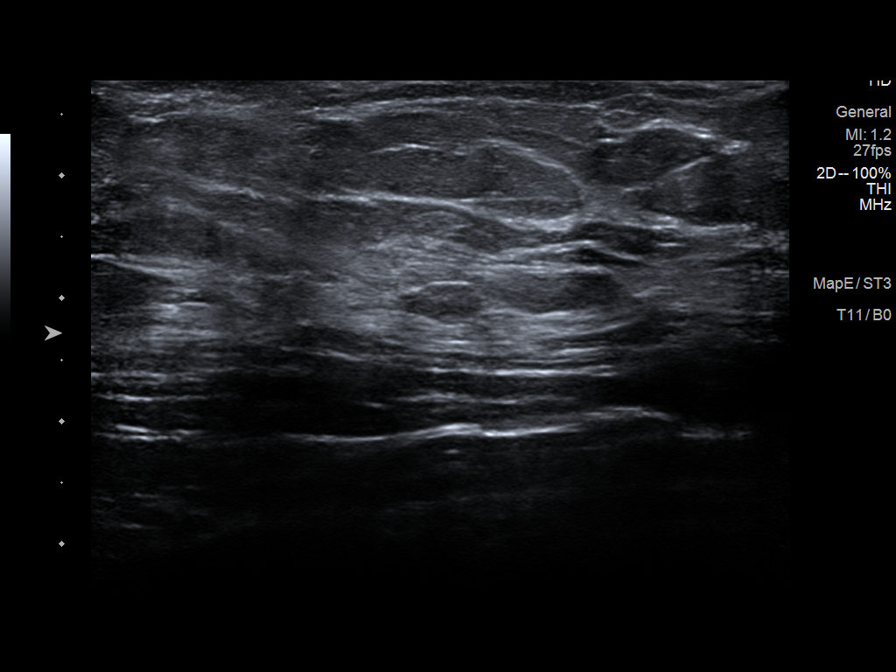
[im 2/9]
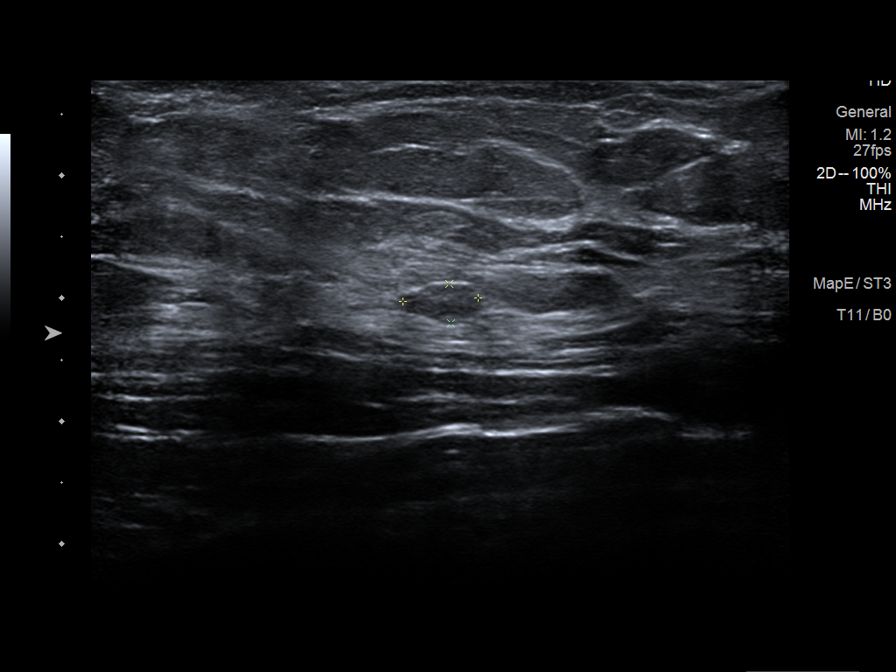
[im 3/9]
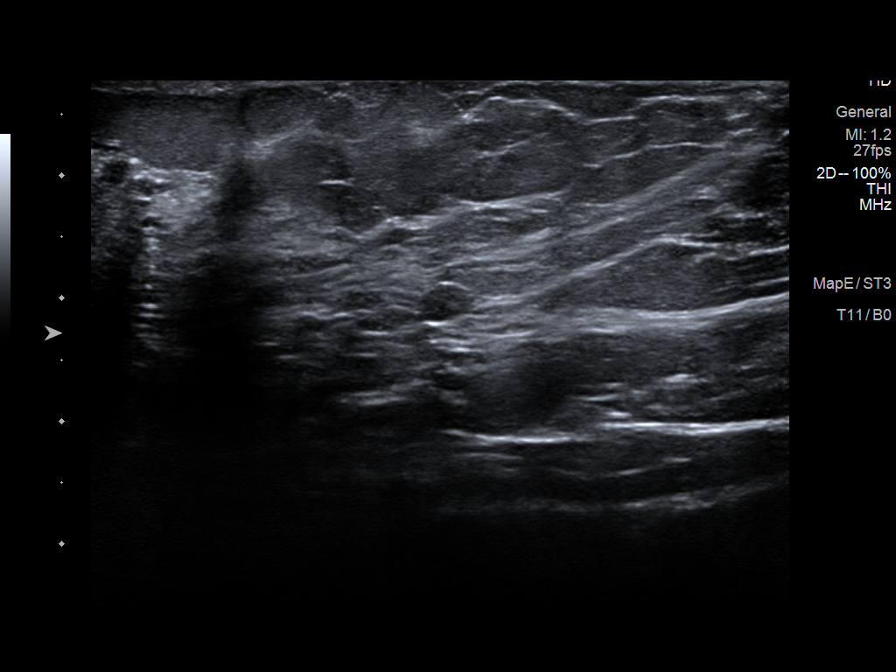
[im 4/9]
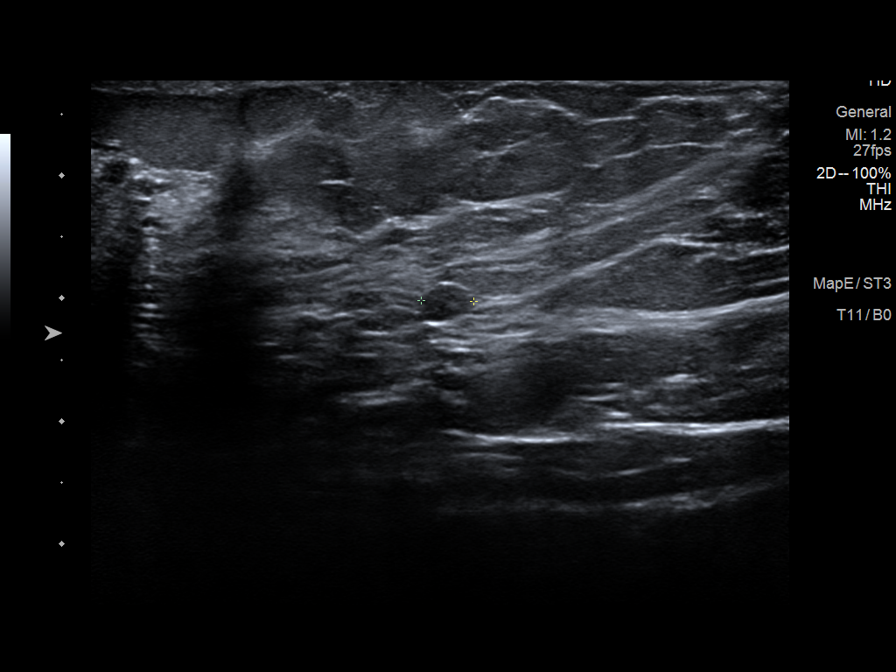
[im 5/9]
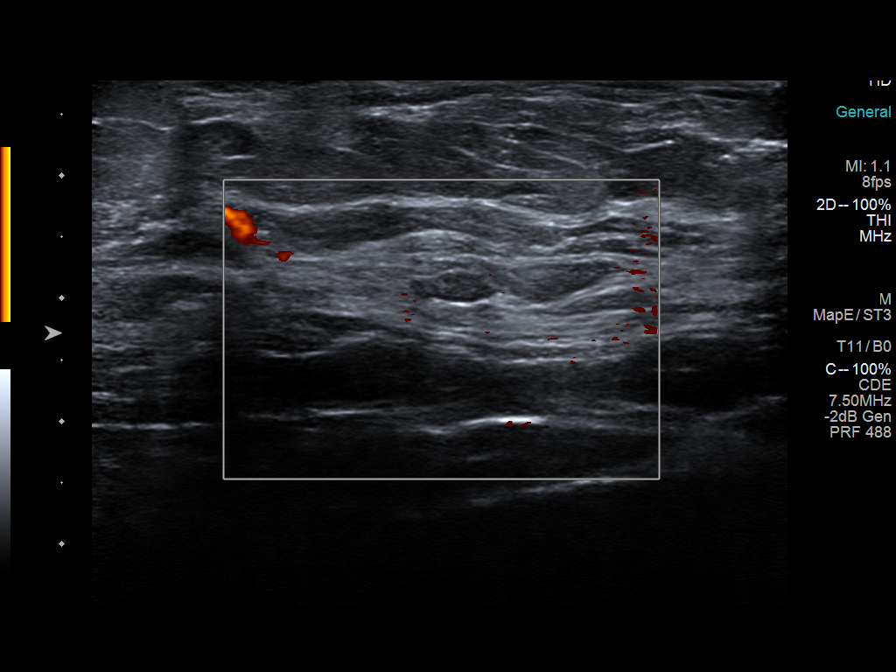
[im 6/9]
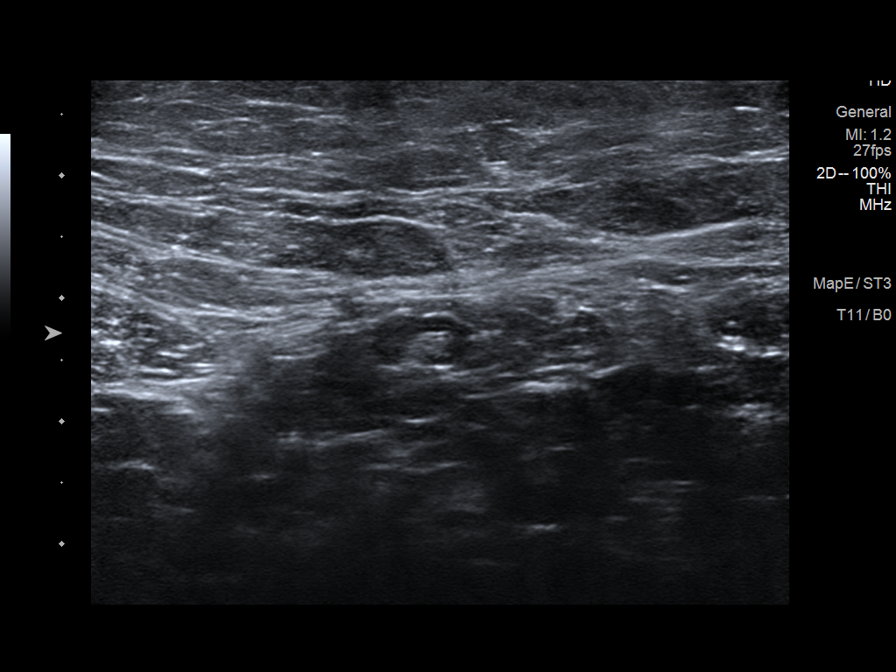
[im 7/9]
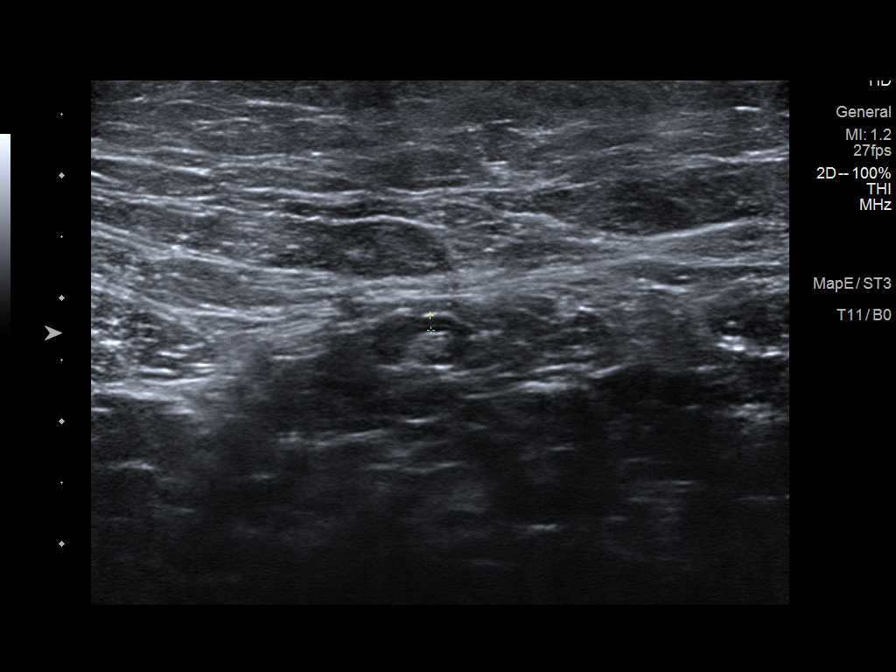
[im 8/9]
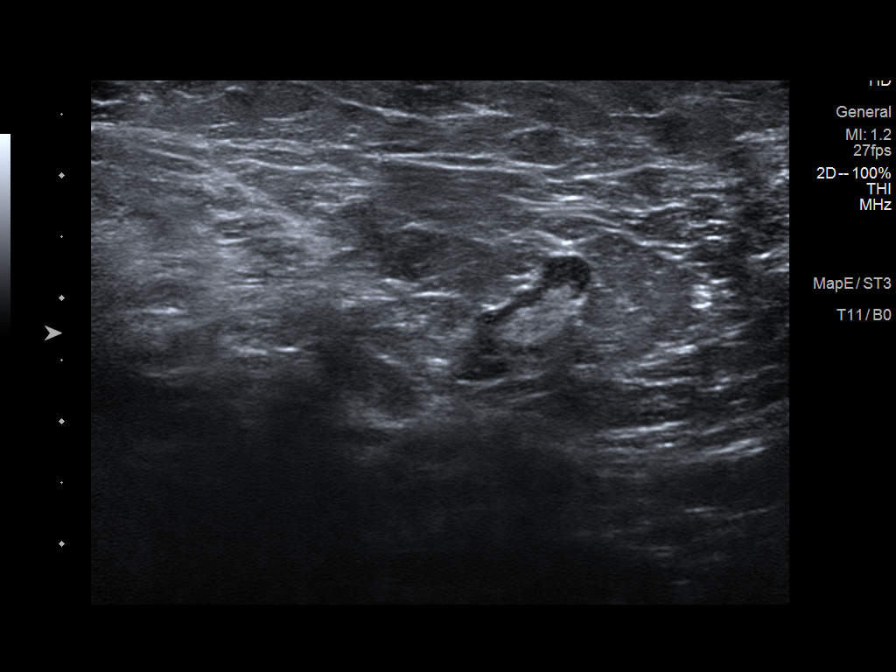
[im 9/9]
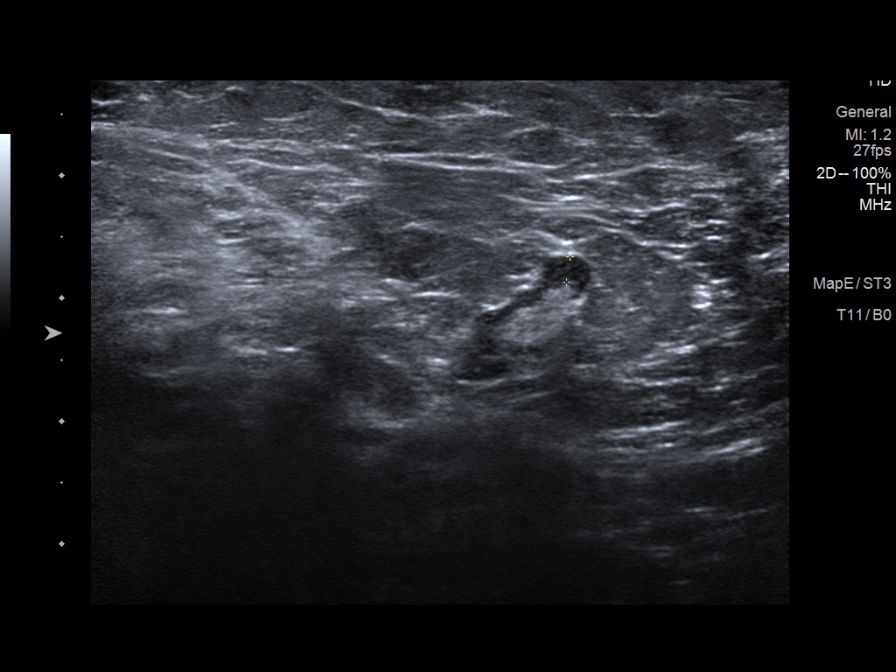

[9 of 9 positions shown; findings below may reference images not displayed]

ACR Breast Density Category b: There are scattered areas of
fibroglandular density.
FINDINGS: Spot compression tomograms were performed of the bilateral breast.
There is an oval circumscribed mass in the upper-outer right breast
measuring 0.9 cm. There is an oval mass in the lower central left
breast measuring 0.5 cm.

Targeted ultrasound of the right breast was performed. There is an
oval circumscribed hypoechoic mass in the right breast at 10 o'clock
7 cm from nipple measuring 0.9 x 0.6 x 0.6 cm. This corresponds well
with the mass seen in the upper-outer right breast at mammography.

Targeted ultrasound of the left breast was. There is an oval
circumscribed hypoechoic mass in the left breast at 6 o'clock 1 cm
from nipple measuring 0.6 x 0.3 x 0.4 cm. This corresponds well with
the mass seen in the lower left breast at mammography.
IMPRESSION: Probably benign bilateral breast masses.

RECOMMENDATION:
Bilateral breast ultrasound in 6 months to demonstrate stability of
the probably benign bilateral breast masses.

I have discussed the findings and recommendations with the patient.
If applicable, a reminder letter will be sent to the patient
regarding the next appointment.

BI-RADS CATEGORY  3: Probably benign.

## 2022-05-14 IMAGING — MG DIGITAL DIAGNOSTIC BILAT W/ TOMO W/ CAD
8 series · 8 of 24 positions shown · non-contrast
Comparison: Mammogram dated 08/03/2021.

CLINICAL DATA: Screening recall from baseline mammography for
bilateral breast masses. History of prior benign right breast
excision.



[R MLO synth-2D]
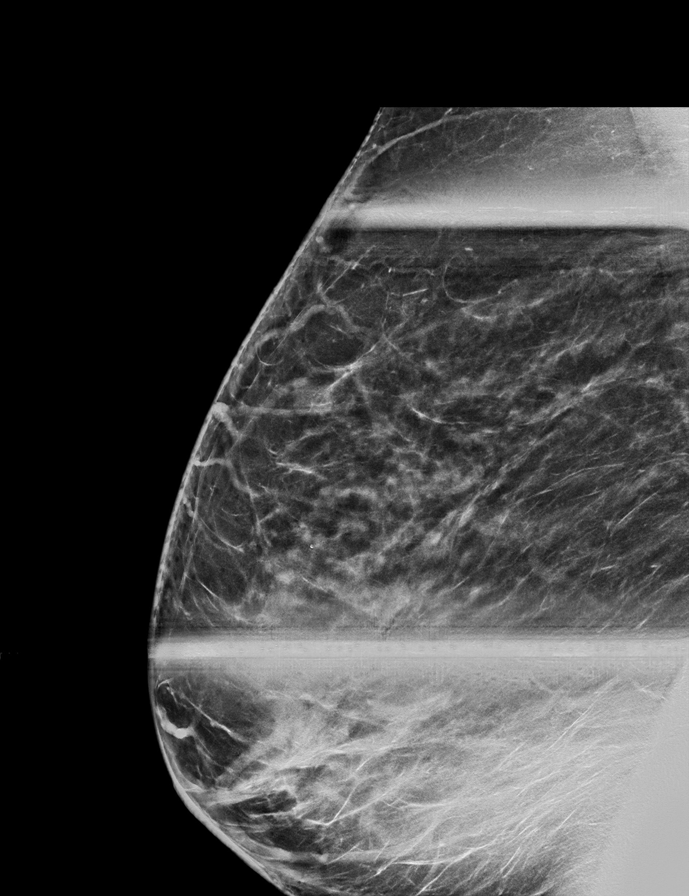

[L MLO synth-2D]
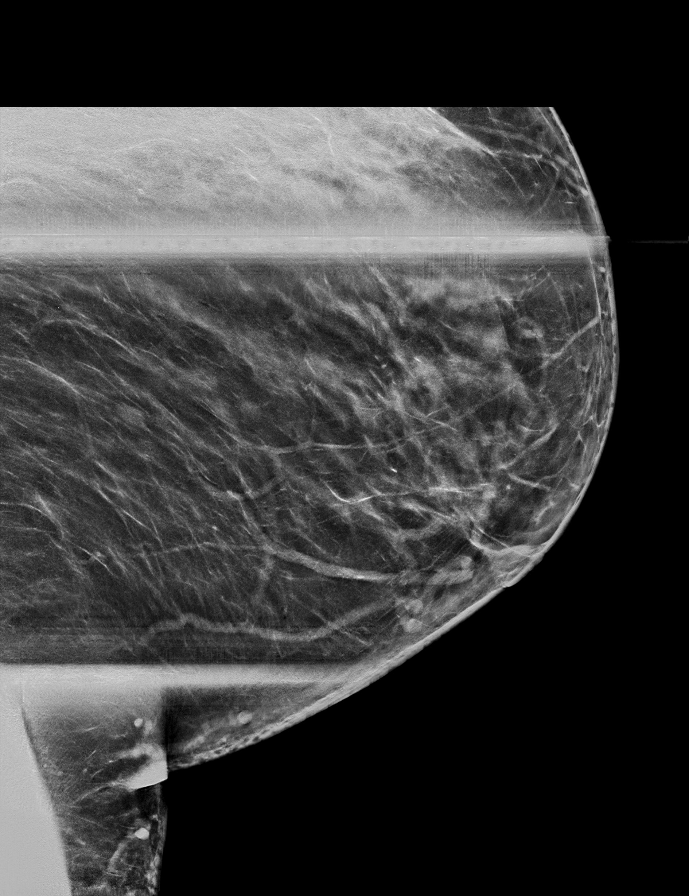

[L CC synth-2D]
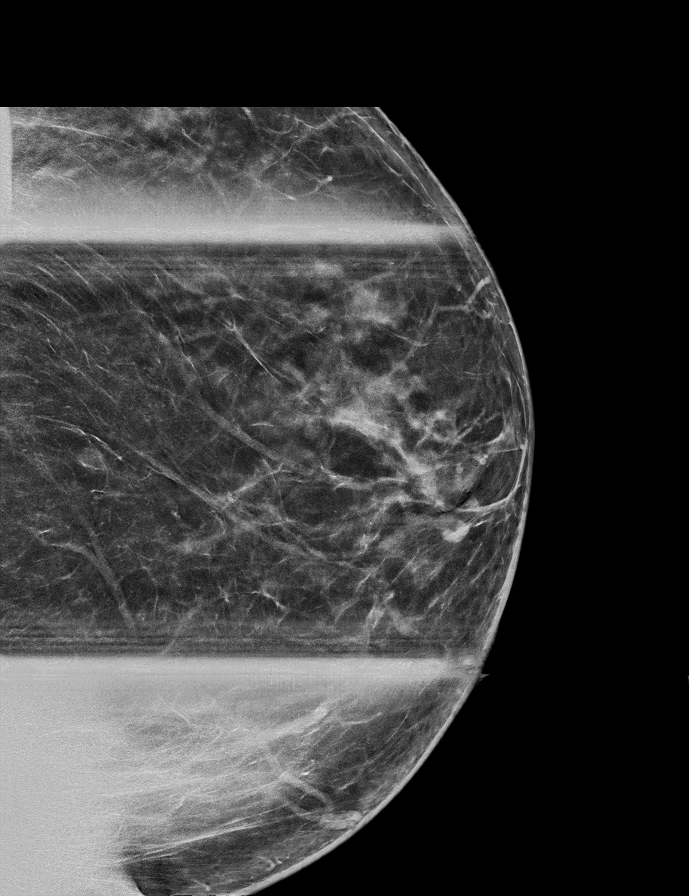

[R CC synth-2D]
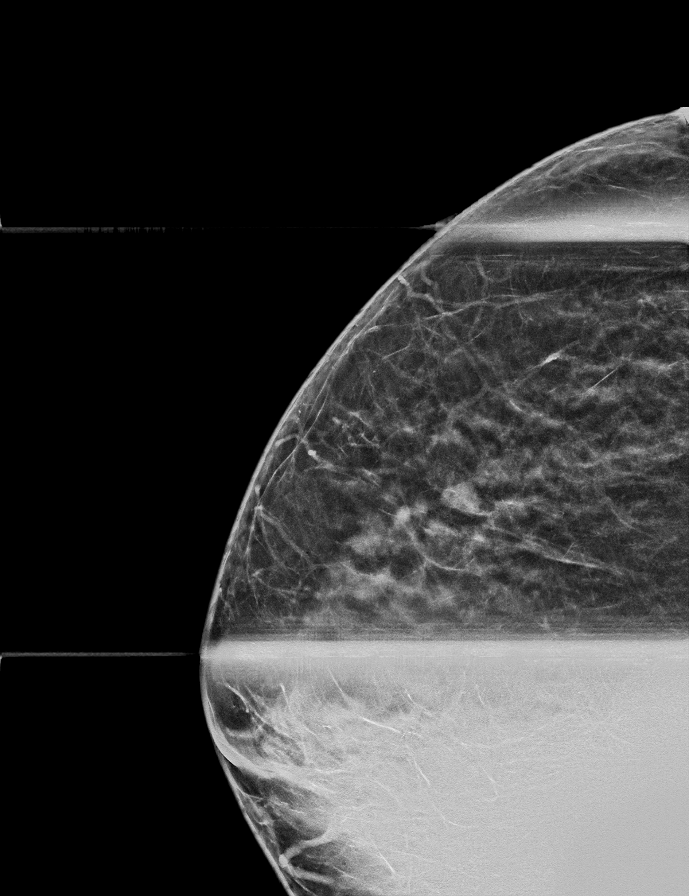

[R MLO tomo · tomo slice 37/72.0]
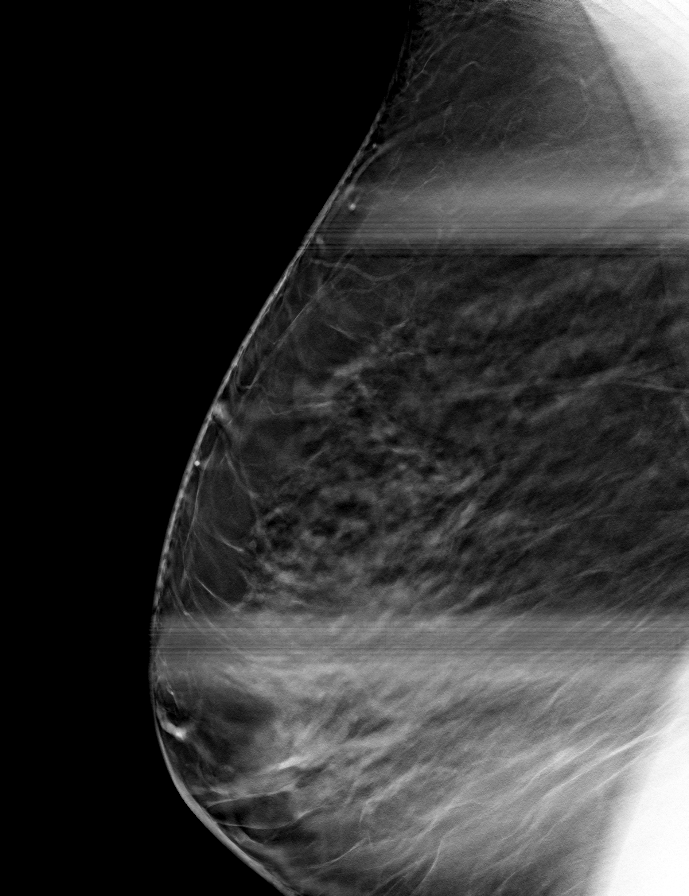

[L MLO tomo · tomo slice 35/70.0]
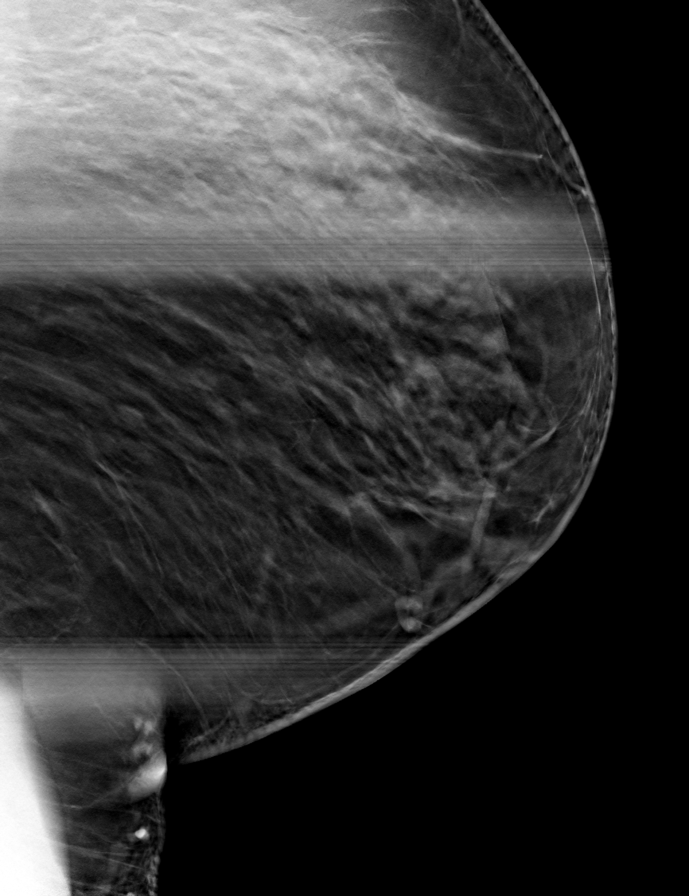

[R CC tomo · tomo slice 27/53.0]
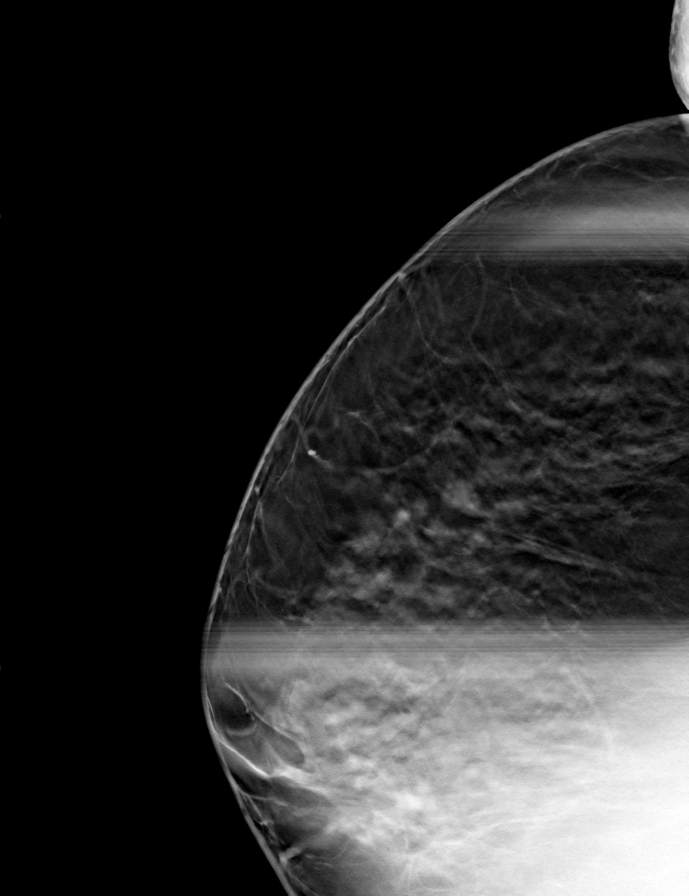

[L CC tomo · tomo slice 37/74.0]
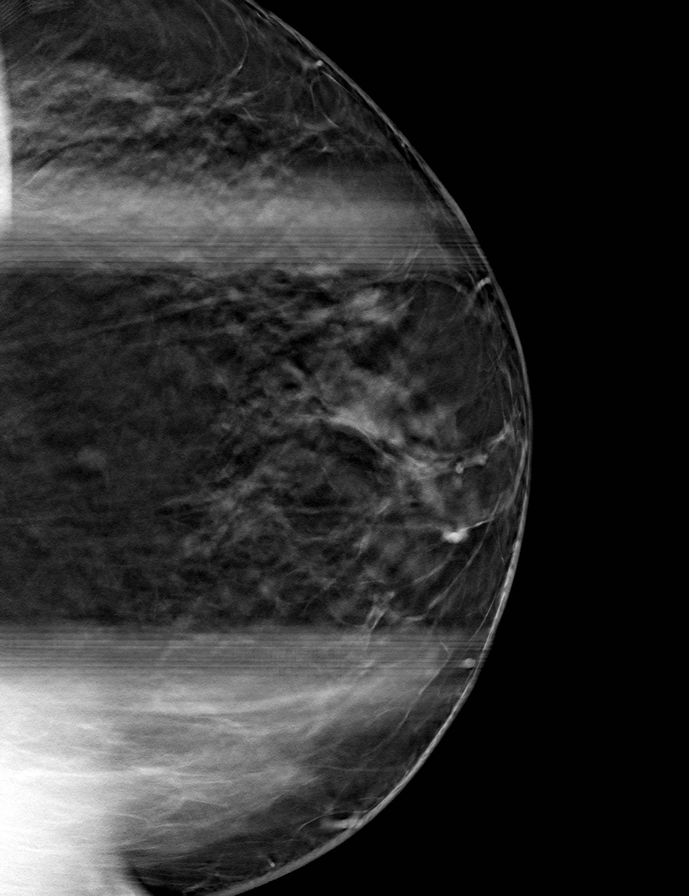

[8 of 24 positions shown; findings below may reference images not displayed]

ACR Breast Density Category b: There are scattered areas of
fibroglandular density.
FINDINGS: Spot compression tomograms were performed of the bilateral breast.
There is an oval circumscribed mass in the upper-outer right breast
measuring 0.9 cm. There is an oval mass in the lower central left
breast measuring 0.5 cm.

Targeted ultrasound of the right breast was performed. There is an
oval circumscribed hypoechoic mass in the right breast at 10 o'clock
7 cm from nipple measuring 0.9 x 0.6 x 0.6 cm. This corresponds well
with the mass seen in the upper-outer right breast at mammography.

Targeted ultrasound of the left breast was. There is an oval
circumscribed hypoechoic mass in the left breast at 6 o'clock 1 cm
from nipple measuring 0.6 x 0.3 x 0.4 cm. This corresponds well with
the mass seen in the lower left breast at mammography.
IMPRESSION: Probably benign bilateral breast masses.

RECOMMENDATION:
Bilateral breast ultrasound in 6 months to demonstrate stability of
the probably benign bilateral breast masses.

I have discussed the findings and recommendations with the patient.
If applicable, a reminder letter will be sent to the patient
regarding the next appointment.

BI-RADS CATEGORY  3: Probably benign.

## 2022-05-18 ENCOUNTER — Encounter: Payer: Self-pay | Admitting: Family Medicine

## 2022-05-18 ENCOUNTER — Ambulatory Visit (INDEPENDENT_AMBULATORY_CARE_PROVIDER_SITE_OTHER): Payer: 59 | Admitting: Family Medicine

## 2022-05-18 ENCOUNTER — Ambulatory Visit (INDEPENDENT_AMBULATORY_CARE_PROVIDER_SITE_OTHER): Payer: 59

## 2022-05-18 ENCOUNTER — Other Ambulatory Visit: Payer: Self-pay | Admitting: Family Medicine

## 2022-05-18 VITALS — BP 111/72 | HR 86 | Temp 98.3°F | Resp 16 | Ht 60.0 in | Wt 230.0 lb

## 2022-05-18 DIAGNOSIS — G8929 Other chronic pain: Secondary | ICD-10-CM

## 2022-05-18 DIAGNOSIS — M940 Chondrocostal junction syndrome [Tietze]: Secondary | ICD-10-CM

## 2022-05-18 DIAGNOSIS — R109 Unspecified abdominal pain: Secondary | ICD-10-CM

## 2022-05-18 DIAGNOSIS — Z3202 Encounter for pregnancy test, result negative: Secondary | ICD-10-CM

## 2022-05-18 DIAGNOSIS — E669 Obesity, unspecified: Secondary | ICD-10-CM

## 2022-05-18 DIAGNOSIS — M25561 Pain in right knee: Secondary | ICD-10-CM

## 2022-05-18 DIAGNOSIS — Z6841 Body Mass Index (BMI) 40.0 and over, adult: Secondary | ICD-10-CM

## 2022-05-18 LAB — POCT URINALYSIS DIP (CLINITEK)
Bilirubin, UA: NEGATIVE
Glucose, UA: NEGATIVE mg/dL
Ketones, POC UA: NEGATIVE mg/dL
Leukocytes, UA: NEGATIVE
Nitrite, UA: NEGATIVE
Spec Grav, UA: 1.02 (ref 1.010–1.025)
Urobilinogen, UA: 0.2 E.U./dL
pH, UA: 7.5 (ref 5.0–8.0)

## 2022-05-18 LAB — POCT URINE PREGNANCY: Preg Test, Ur: NEGATIVE

## 2022-05-18 MED ORDER — MELOXICAM 15 MG PO TABS: 15.0000 mg | ORAL_TABLET | Freq: Every day | ORAL | 1 refills | Status: DC

## 2022-05-18 MED ORDER — LIDOCAINE 5 % EX PTCH: 1.0000 | MEDICATED_PATCH | CUTANEOUS | 1 refills | Status: DC

## 2022-05-18 NOTE — Progress Notes (Signed)
Pt presents for right knee pain, going on for about 6 months -also got right side pain

## 2022-05-18 NOTE — Patient Instructions (Signed)
Costochondritis  Costochondritis is irritation and swelling (inflammation) of the tissue that connects the ribs to the breastbone (sternum). This tissue is called cartilage. Costochondritis causes pain in the front of the chest. Usually, the pain: Starts slowly. Is in more than one rib. What are the causes? The exact cause of this condition is not always known. It results from stress on the tissue in the affected area. The cause of this stress could be: Chest injury. Exercise or activity, such as lifting. Very bad coughing. What increases the risk? You are more likely to develop this condition if you: Are female. Are 7-72 years old. Recently started a new exercise or work activity. Have low levels of vitamin D. Have a condition that makes you cough often. What are the signs or symptoms? The main symptom of this condition is chest pain. The pain: Usually starts slowly and can be sharp or dull. Gets worse with deep breathing, coughing, or exercise. Gets better with rest. May be worse when you press on the affected area of your ribs and breastbone. How is this treated? This condition usually goes away on its own over time. Your doctor may prescribe an NSAID, such as ibuprofen. This can help reduce pain and inflammation. Treatment may also include: Resting and avoiding activities that make pain worse. Putting heat or ice on the painful area. Doing exercises to stretch your chest muscles. If these treatments do not help, your doctor may inject a numbing medicine to help relieve the pain. Follow these instructions at home: Managing pain, stiffness, and swelling     If told, put ice on the painful area. To do this: Put ice in a plastic bag. Place a towel between your skin and the bag. Leave the ice on for 20 minutes, 2-3 times a day. If told, put heat on the affected area. Do this as often as told by your doctor. Use the heat source that your doctor recommends, such as a moist heat  pack or a heating pad. Place a towel between your skin and the heat source. Leave the heat on for 20-30 minutes. Take off the heat if your skin turns bright red. This is very important if you cannot feel pain, heat, or cold. You may have a greater risk of getting burned. Activity Rest as told by your doctor. Do not do anything that makes your pain worse. This includes any activities that use chest, belly (abdomen), and side muscles. Do not lift anything that is heavier than 10 lb (4.5 kg), or the limit that you are told, until your doctor says that it is safe. Return to your normal activities as told by your doctor. Ask your doctor what activities are safe for you. General instructions Take over-the-counter and prescription medicines only as told by your doctor. Keep all follow-up visits as told by your doctor. This is important. Contact a doctor if: You have chills or a fever. Your pain does not go away or it gets worse. You have a cough that does not go away. Get help right away if: You are short of breath. You have very bad chest pain that is not helped by medicines, heat, or ice. These symptoms may be an emergency. Do not wait to see if the symptoms will go away. Get medical help right away. Call your local emergency services (911 in the [place] Do not drive yourself to the hospital. Summary Costochondritis is irritation and swelling (inflammation) of the tissue that connects the ribs to the breastbone (  sternum). This condition causes pain in the front of the chest. Treatment may include medicines, rest, heat or ice, and exercises. This information is not intended to replace advice given to you by your health care provider. Make sure you discuss any questions you have with your health care provider. Document Revised: 10/31/2021 Document Reviewed: 06/26/2019 Elsevier Patient Education  [place].

## 2022-05-18 NOTE — Progress Notes (Signed)
Subjective:  Patient ID: Alicia [name], female    DOB: [birth year]  Age: 41 y.o. MRN: [id number]  CC: Knee Pain and Flank Pain (Right )   HPI Alicia [name] is a 41 y.o. year old female with a history of Generalized Anxiety, Obesity here for an office visit.  Interval History:  For the last 6 months she has had right sided pain which prevents her from sleeping on that side and it wakes her up from sleep if she accidentally lies on that side and is present everyday. She only feels it when she lies on that side but it is absent at rest.  Her right knee has also been hurting for several months, swells and gives out on her. It  On rising from a seated position she has to stand for some time prior to walking. 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 Advil has been ineffective. It causes her to limp as well.  Past Medical History:  Diagnosis Date   Anxiety    Arthritis    Phreesia 03/08/2020   Bladder infection    Complication of anesthesia    Epidural did not work w/2nd preg. labor   Depression    No med. currently, Stopped with + UPT   Depression 06/27/2012   Gonorrhea    Hemorrhoids 06/27/2012   Osteoarthritis 06/27/2012   Osteoporosis    Phreesia 03/08/2020   Panic disorder 06/27/2012   Sleep apnea    Phreesia 03/08/2020    Past Surgical History:  Procedure Laterality Date   BREAST MASS EXCISION     Benign   BREAST SURGERY N/A    Phreesia 03/08/2020   CESAREAN SECTION     X 1 1st preg., then VBAC   CESAREAN SECTION N/A 01/27/2013   Procedure: CESAREAN SECTION;  Surgeon: Shelly Bombard, MD;  Location: [facility];  Service: Obstetrics;  Laterality: N/A;   MOUTH SURGERY      Family History  Problem Relation Age of Onset   Cancer Mother    Other Neg Hx    Alcohol abuse Neg Hx    Arthritis Neg Hx    Asthma Neg Hx    Birth defects Neg Hx    COPD Neg Hx    Depression Neg Hx    Drug abuse Neg Hx    Diabetes Neg  Hx    Early death Neg Hx    Hearing loss Neg Hx    Heart disease Neg Hx    Hyperlipidemia Neg Hx    Hypertension Neg Hx    Kidney disease Neg Hx    Learning disabilities Neg Hx    Mental illness Neg Hx    Mental retardation Neg Hx    Miscarriages / Stillbirths Neg Hx    Stroke Neg Hx    Vision loss Neg Hx     Social History   Socioeconomic History   Marital status: Widowed    Spouse name: Not on file   Number of children: Not on file   Years of education: Not on file   Highest education level: Not on file  Occupational History   Not on file  Tobacco Use   Smoking status: Every Day    Packs/day: 0.25    Types: Cigarettes   Smokeless tobacco: Never  Vaping Use   Vaping Use: Never used  Substance and Sexual Activity   Alcohol use: Yes  Comment: occ   Drug use: No   Sexual activity: Yes    Birth control/protection: None    Comment: Last intercourse at least 1 week ago  Other Topics Concern   Not on file  Social History Narrative   Not on file   Social Determinants of Health   Financial Resource Strain: Low Risk  (03/17/2021)   Overall Financial Resource Strain (CARDIA)    Difficulty of Paying Living Expenses: Not hard at all  Food Insecurity: No Food Insecurity (03/17/2021)   Hunger Vital Sign    Worried About Running Out of Food in the Last Year: Never true    [place] in the Last Year: Never true  Transportation Needs: No Transportation Needs (03/17/2021)   PRAPARE - Hydrologist (Medical): No    Lack of Transportation (Non-Medical): No  Physical Activity: Inactive (03/17/2021)   Exercise Vital Sign    Days of Exercise per Week: 0 days    Minutes of Exercise per Session: 0 min  Stress: Stress Concern Present (03/17/2021)   [place]    Feeling of Stress : Rather much  Social Connections: Moderately Isolated (03/17/2021)   Social Connection and Isolation  Panel [NHANES]    Frequency of Communication with Friends and Family: More than three times a week    Frequency of Social Gatherings with Friends and Family: Twice a week    Attends Religious Services: More than 4 times per year    Active Member of [employer] or Organizations: No    Attends Archivist Meetings: Never    Marital Status: Widowed    Allergies  Allergen Reactions   Penicillins     Outpatient Medications Prior to Visit  Medication Sig Dispense Refill   ARIPiprazole (ABILIFY) 5 MG tablet Take 1 tablet (5 mg total) by mouth daily. 30 tablet 2   calcium carbonate (TUMS - DOSED IN MG ELEMENTAL CALCIUM) 500 MG chewable tablet Chew 1 tablet by mouth daily.     clonazePAM (KLONOPIN) 0.5 MG tablet Take 1 tablet (0.5 mg total) by mouth 2 (two) times daily as needed for anxiety. 60 tablet 1   cyclobenzaprine (FLEXERIL) 10 MG tablet TAKE 1 TABLET BY MOUTH THREE TIMES A DAY AS NEEDED FOR MUSCLE SPASM 30 tablet 0   pantoprazole (PROTONIX) 40 MG tablet TAKE 1 TABLET BY MOUTH EVERY DAY 90 tablet 0   PARoxetine (PAXIL) 40 MG tablet Take 1 tablet (40 mg total) by mouth daily. 30 tablet 2   PROAIR HFA 108 (90 Base) MCG/ACT inhaler INHALE 1-2 PUFFS BY MOUTH EVERY 6 HOURS AS NEEDED FOR WHEEZE OR SHORTNESS OF BREATH 8.5 each 1   Vitamin D, Ergocalciferol, (DRISDOL) 1.25 MG (50000 UNIT) CAPS capsule TAKE 1 CAPSULE (50,000 UNITS TOTAL) BY MOUTH EVERY 7 (SEVEN) DAYS 4 capsule 2   Vitamin D, Ergocalciferol, (DRISDOL) 1.25 MG (50000 UNIT) CAPS capsule Take 1 capsule (50,000 Units total) by mouth every 7 (seven) days. 12 capsule 0   meloxicam (MOBIC) 15 MG tablet Take 1 tablet (15 mg total) by mouth daily. 30 tablet 0   No facility-administered medications prior to visit.     ROS Review of Systems  Constitutional:  Negative for activity change and appetite change.  HENT:  Negative for sinus pressure and sore throat.   Respiratory:  Negative for chest tightness, shortness of breath and  wheezing.   Cardiovascular:  Negative for chest pain and palpitations.  Gastrointestinal:  Negative for abdominal distention, abdominal pain and constipation.  Genitourinary: Negative.   Musculoskeletal:        See HPI  Psychiatric/Behavioral:  Negative for behavioral problems and dysphoric mood.     Objective:  BP 111/72 (BP Location: Right Arm, Patient Position: Sitting, Cuff Size: Large)   Pulse 86   Temp 98.3 F (36.8 C)   Resp 16   Ht 5' (1.524 m)   Wt 230 lb (104.3 kg)   LMP  (LMP Unknown)   SpO2 99%   BMI 44.92 kg/m      05/18/2022   10:04 AM 01/23/2022    8:21 AM 11/20/2021    1:59 PM  BP/Weight  Systolic BP 448 185 631  Diastolic BP 72 68 70  Wt. (Lbs) 230 234 234  BMI 44.92 kg/m2 45.7 kg/m2 45.7 kg/m2      Physical Exam Constitutional:      Appearance: She is well-developed. She is obese.  Cardiovascular:     Rate and Rhythm: Normal rate.     Heart sounds: Normal heart sounds. No murmur heard. Pulmonary:     Effort: Pulmonary effort is normal.     Breath sounds: Normal breath sounds. No wheezing or rales.  Chest:     Chest wall: No tenderness.  Abdominal:     General: Bowel sounds are normal. There is no distension.     Palpations: Abdomen is soft. There is no mass.     Tenderness: There is no abdominal tenderness. There is no right CVA tenderness or left CVA tenderness.  Musculoskeletal:     Right lower leg: No edema.     Left lower leg: No edema.     Comments: Slight suprapatellar right knee edema with associated tenderness on flexion and extension Tenderness on deep palpation of inferior border of rib cage extending from right posterolateral aspect anteriorly.  No rash evident  Neurological:     Mental Status: She is alert and oriented to person, place, and time.  Psychiatric:        Mood and Affect: Mood normal.        Latest Ref Rng & Units 01/23/2022    8:52 AM 07/03/2021    3:23 PM 01/03/2021   12:49 PM  CMP  Glucose 70 - 99 mg/dL 86  85     BUN 6 - 24 mg/dL 8  8    Creatinine 0.57 - 1.00 mg/dL 0.68  0.65    Sodium 134 - 144 mmol/L 140  138    Potassium 3.5 - 5.2 mmol/L 3.9  3.7    Chloride 96 - 106 mmol/L 104  102    CO2 20 - 29 mmol/L 23  26    Calcium 8.7 - 10.2 mg/dL 9.4  9.1    Total Protein 6.0 - 8.5 g/dL 7.1  6.9  7.0   Total Bilirubin 0.0 - 1.2 mg/dL 1.0  0.7  0.7   Alkaline Phos 44 - 121 IU/L 65  67  63   AST 0 - 40 IU/L '15  13  19   '$ ALT 0 - 32 IU/L '10  13  15     '$ Lipid Panel     Component Value Date/Time   CHOL 177 01/23/2022 0852   TRIG 95 01/23/2022 0852   HDL 42 01/23/2022 0852   CHOLHDL 4.2 01/23/2022 0852   LDLCALC 117 (H) 01/23/2022 0852    CBC    Component Value Date/Time   WBC 5.8 01/23/2022 0852  WBC 9.6 10/28/2021 1659   RBC 4.96 01/23/2022 0852   RBC 4.93 10/28/2021 1659   HGB 12.9 01/23/2022 0852   HCT 41.4 01/23/2022 0852   PLT 237 01/23/2022 0852   MCV 84 01/23/2022 0852   MCH 26.0 (L) 01/23/2022 0852   MCH 27.2 10/28/2021 1659   MCHC 31.2 (L) 01/23/2022 0852   MCHC 32.5 10/28/2021 1659   RDW 12.7 01/23/2022 0852   LYMPHSABS 2.5 01/23/2022 0852   MONOABS 0.6 09/16/2019 1427   EOSABS 0.4 01/23/2022 0852   BASOSABS 0.1 01/23/2022 0852    Lab Results  Component Value Date   HGBA1C 5.5 11/20/2021    Assessment & Plan:  1. Chronic pain of right knee Uncontrolled Suspicious for osteoarthritis - DG Knee Complete 4 Views Right; Future - AMB referral to orthopedics - meloxicam (MOBIC) 15 MG tablet; Take 1 tablet (15 mg total) by mouth daily.  Dispense: 30 tablet; Refill: 1  2. Costochondritis Placed on Lidoderm patches If symptoms persist she might benefit from imaging  3. Flank pain UA negative for UTI but reveals trace blood.  Patient just came off her monthly cycle - lidocaine (LIDODERM) 5 %; Place 1 patch onto the skin daily. Remove & Discard patch within 12 hours or as directed by MD  Dispense: 30 patch; Refill: 1 - POCT urine pregnancy - POCT URINALYSIS DIP  (CLINITEK)   Meds ordered this encounter  Medications   meloxicam (MOBIC) 15 MG tablet    Sig: Take 1 tablet (15 mg total) by mouth daily.    Dispense:  30 tablet    Refill:  1   lidocaine (LIDODERM) 5 %    Sig: Place 1 patch onto the skin daily. Remove & Discard patch within 12 hours or as directed by MD    Dispense:  30 patch    Refill:  1    Follow-up: Return in about 3 months (around 08/17/2022) for Rigth knee pain with PCP.       Charlott Rakes, MD, FAAFP. [facility] [place], [place]   05/18/2022, 1:04 PM

## 2022-05-19 ENCOUNTER — Other Ambulatory Visit: Payer: Self-pay | Admitting: Family Medicine

## 2022-05-22 NOTE — Progress Notes (Unsigned)
Cardiology Office Note:    Date:  05/24/2022   ID:  Alicia [name], DOB 02-Sep-1980, MRN [id number]  PCP:  Dorna Mai, MD   [facility] Cardiologist:  Lenna Sciara, MD Referring MD: Dorna Mai, MD   Chief Complaint/Reason for Referral: Follow-up syncope  ASSESSMENT:    1. Lightheadedness   2. BMI 40.0-44.9, adult ([place])      PLAN:    In order of problems listed above: 1.  Lightheadedness: The patient's cardiac evaluation has been reassuring.  No further cardiac evaluation is required.  We will keep follow-up open-ended. 2.  Elevated BMI: I offered to refer the patient to pharmacy to discuss pharmacotherapy but she would like to review this with her PCP.    Dispo:  Return if symptoms worsen or fail to improve.      Medication Adjustments/Labs and Tests Ordered: Current medicines are reviewed at length with the patient today.  Concerns regarding medicines are outlined above.  The following changes have been made:  no change   Labs/tests ordered: Orders Placed This Encounter  Procedures   EKG 12-Lead    Medication Changes: No orders of the defined types were placed in this encounter.    Current medicines are reviewed at length with the patient today.  The patient does not have concerns regarding medicines.   History of Present Illness:    FOCUSED PROBLEM LIST:   1.  Anxiety 2.  Tobacco abuse 3.  BMI of 44  November 2022 consultation: The patient is a 41 y.o. female with the indicated medical history here for follow-up.  When she was seen in November 2022 was for multiple syncopal episodes that seem to be vasovagal in nature.  Work-up was conducted including echocardiogram, monitor, and laboratories which were all reassuring.  Today: In the interim patient unfortunately had a spontaneous miscarriage in early spring.  In terms of lightheadedness this is basically resolved.  She denies any chest pain, palpitations, paroxysmal nocturnal  dyspnea, orthopnea.  She occasionally gets short of breath when she is at work.  She unfortunately continues to smoke 1 to 2 packs a day.  She is to see an orthopedic surgeon later today regarding her right knee.   Current Medications: Current Meds  Medication Sig   ARIPiprazole (ABILIFY) 5 MG tablet Take 1 tablet (5 mg total) by mouth daily.   clonazePAM (KLONOPIN) 0.5 MG tablet Take 1 tablet (0.5 mg total) by mouth 2 (two) times daily as needed for anxiety.   cyclobenzaprine (FLEXERIL) 10 MG tablet TAKE 1 TABLET BY MOUTH THREE TIMES A DAY AS NEEDED FOR MUSCLE SPASM   lidocaine (LIDODERM) 5 % Place 1 patch onto the skin daily. Remove & Discard patch within 12 hours or as directed by MD   meloxicam (MOBIC) 15 MG tablet Take 1 tablet (15 mg total) by mouth daily.   pantoprazole (PROTONIX) 40 MG tablet TAKE 1 TABLET BY MOUTH EVERY DAY   PARoxetine (PAXIL) 40 MG tablet Take 1 tablet (40 mg total) by mouth daily.   VENTOLIN HFA 108 (90 Base) MCG/ACT inhaler INHALE 1-2 PUFFS BY MOUTH EVERY 6 HOURS AS NEEDED FOR WHEEZE OR SHORTNESS OF BREATH   Vitamin D, Ergocalciferol, (DRISDOL) 1.25 MG (50000 UNIT) CAPS capsule Take 1 capsule (50,000 Units total) by mouth every 7 (seven) days.   [DISCONTINUED] calcium carbonate (TUMS - DOSED IN MG ELEMENTAL CALCIUM) 500 MG chewable tablet Chew 1 tablet by mouth daily.   [DISCONTINUED] Vitamin D, Ergocalciferol, (DRISDOL) 1.25 MG (50000  UNIT) CAPS capsule TAKE 1 CAPSULE (50,000 UNITS TOTAL) BY MOUTH EVERY 7 (SEVEN) DAYS     Allergies:    Penicillins   Social History:   Social History   Tobacco Use   Smoking status: Every Day    Packs/day: 0.25    Types: Cigarettes   Smokeless tobacco: Never  Vaping Use   Vaping Use: Never used  Substance Use Topics   Alcohol use: Yes    Comment: occ   Drug use: No     Family Hx: Family History  Problem Relation Age of Onset   Cancer Mother    Other Neg Hx    Alcohol abuse Neg Hx    Arthritis Neg Hx    Asthma  Neg Hx    Birth defects Neg Hx    COPD Neg Hx    Depression Neg Hx    Drug abuse Neg Hx    Diabetes Neg Hx    Early death Neg Hx    Hearing loss Neg Hx    Heart disease Neg Hx    Hyperlipidemia Neg Hx    Hypertension Neg Hx    Kidney disease Neg Hx    Learning disabilities Neg Hx    Mental illness Neg Hx    Mental retardation Neg Hx    Miscarriages / Stillbirths Neg Hx    Stroke Neg Hx    Vision loss Neg Hx      Review of Systems:   Please see the history of present illness.    All other systems reviewed and are negative.     EKGs/Labs/Other Test Reviewed:    EKG:  EKG performed November 2022 that I personally reviewed demonstrates normal sinus rhythm; EKG demonstrates sinus rhythm with a rightward axis  Prior CV studies:  TTE 2022 with ejection fraction of 55 to 60% with no left ventricular hypertrophy or valvular abnormalities.  Monitor 2022 with rare supraventricular and ventricular ectopic beats no atrial fibrillation, sustained ventricular tachyarrhythmias, or bradycardia arrhythmias.  Other studies Reviewed: Review of the additional studies/records demonstrates: CT abdomen pelvis 2021 without aortic pathology  Recent Labs: 01/23/2022: ALT 10; BUN 8; Creatinine, Ser 0.68; Hemoglobin 12.9; Platelets 237; Potassium 3.9; Sodium 140; TSH 0.921   Recent Lipid Panel Lab Results  Component Value Date/Time   CHOL 177 01/23/2022 08:52 AM   TRIG 95 01/23/2022 08:52 AM   HDL 42 01/23/2022 08:52 AM   LDLCALC 117 (H) 01/23/2022 08:52 AM    Risk Assessment/Calculations:           Physical Exam:    VS:  BP 100/70   Pulse 81   Ht 5' (1.524 m)   Wt 232 lb 9.6 oz (105.5 kg)   LMP  (LMP Unknown)   SpO2 99%   BMI 45.43 kg/m    Wt Readings from Last 3 Encounters:  05/24/22 232 lb 9.6 oz (105.5 kg)  05/18/22 230 lb (104.3 kg)  01/23/22 234 lb (106.1 kg)    GENERAL:  No apparent distress, AOx3 HEENT:  No carotid bruits, +2 carotid impulses, no scleral  icterus CAR: RRR no murmurs, gallops, rubs, or thrills RES:  Clear to auscultation bilaterally ABD:  Soft, nontender, nondistended, positive bowel sounds x 4 VASC:  +2 radial pulses, +2 carotid pulses, palpable pedal pulses NEURO:  CN 2-12 grossly intact; motor and sensory grossly intact PSYCH:  No active depression or anxiety EXT:  No edema, ecchymosis, or cyanosis  Signed, Early Osmond, MD  05/24/2022 10:38 AM  [facility] [place], [place], [place]  [address] Phone: [phone]; Fax: [phone]   Note:  This document was prepared using Dragon voice recognition software and may include unintentional dictation errors.

## 2022-05-24 ENCOUNTER — Encounter: Payer: Self-pay | Admitting: Sports Medicine

## 2022-05-24 ENCOUNTER — Ambulatory Visit: Payer: 59 | Attending: Internal Medicine | Admitting: Internal Medicine

## 2022-05-24 ENCOUNTER — Encounter: Payer: Self-pay | Admitting: Internal Medicine

## 2022-05-24 ENCOUNTER — Ambulatory Visit (INDEPENDENT_AMBULATORY_CARE_PROVIDER_SITE_OTHER): Payer: 59 | Admitting: Sports Medicine

## 2022-05-24 ENCOUNTER — Ambulatory Visit: Payer: Self-pay

## 2022-05-24 VITALS — Ht 60.0 in | Wt 232.0 lb

## 2022-05-24 VITALS — BP 100/70 | HR 81 | Ht 60.0 in | Wt 232.6 lb

## 2022-05-24 DIAGNOSIS — G8929 Other chronic pain: Secondary | ICD-10-CM | POA: Diagnosis not present

## 2022-05-24 DIAGNOSIS — Z6841 Body Mass Index (BMI) 40.0 and over, adult: Secondary | ICD-10-CM | POA: Diagnosis not present

## 2022-05-24 DIAGNOSIS — R42 Dizziness and giddiness: Secondary | ICD-10-CM | POA: Diagnosis not present

## 2022-05-24 DIAGNOSIS — M25561 Pain in right knee: Secondary | ICD-10-CM | POA: Diagnosis not present

## 2022-05-24 DIAGNOSIS — M25461 Effusion, right knee: Secondary | ICD-10-CM

## 2022-05-24 MED ORDER — BUPIVACAINE HCL 0.25 % IJ SOLN
1.0000 mL | INTRAMUSCULAR | Status: AC | PRN
  Administered 2022-05-24: 1 mL via INTRA_ARTICULAR

## 2022-05-24 MED ORDER — LIDOCAINE HCL 1 % IJ SOLN
5.0000 mL | INTRAMUSCULAR | Status: AC | PRN
  Administered 2022-05-24: 5 mL

## 2022-05-24 MED ORDER — METHYLPREDNISOLONE ACETATE 40 MG/ML IJ SUSP
80.0000 mg | INTRAMUSCULAR | Status: AC | PRN
  Administered 2022-05-24: 80 mg via INTRA_ARTICULAR

## 2022-05-24 NOTE — Progress Notes (Signed)
Right knee pain; complaints of giving out  Has had x-rays of the knee, was told she had fluid on the knee  States that it stays swollen Denies medication for pain; as she states nothing helps

## 2022-05-24 NOTE — Progress Notes (Signed)
Alicia [name] - 41 y.o. female MRN [id number]  Date of birth: [birth year]  Office Visit Note: Visit Date: 05/24/2022 PCP: Dorna Mai, MD Referred by: Charlott Rakes, MD  Subjective: Chief Complaint  Patient presents with   Right Knee - Pain   HPI: Alicia [name] is a pleasant 41 y.o. female who presents today for chronic right knee pain.  She has had this knee pain for at least a few months.  The knee will swell intermittently for her as well.  More recently she is having some episodes of the right knee giving out.  Has pain over the anterior aspect of the knee, sometimes over the medial joint line as well.  She has tried anti-inflammatories, Tylenol without any relief.  She stands for long periods at a time at her job and this will worsen her pain.  At times gets clicking and catching about the knee, no locking.  Pertinent ROS were reviewed with the patient and found to be negative unless otherwise specified above in HPI.   Assessment & Plan: Visit Diagnoses:  1. Chronic pain of right knee   2. Effusion, right knee    Plan: [name] has had some chronic knee pain that has been resistant to oral anti-inflammatory and pain medication.  She is now having some episodes of the knee giving out on her with intermittent swelling.  Limited ultrasound evaluation does show some signs concerning for possible medial meniscus involvement.  Through shared decision making, elected to proceed with corticosteroid injection to help calm down the pain and swelling.  We will get her started on formalized physical therapy for the right knee.  I will see her back in about a month after beginning this and see how she is doing.  If the knee continues having giving way or recurrent pain even in lieu of the injection, we may consider MRI to better quantify meniscus and other internal structures.  Follow-up in 1 month.  Recommended ice, Tylenol and or over-the-counter anti-inflammatories for any postinjection  or continued pain.  Follow-up: Return in about 4 weeks (around 06/21/2022).   Meds & Orders: No orders of the defined types were placed in this encounter.   Orders Placed This Encounter  Procedures   Large Joint Inj   [place] Extrem Low Right Ltd   Ambulatory referral to Physical Therapy     Procedures: Large Joint Inj: R knee on 05/24/2022 2:52 PM Indications: pain and joint swelling Details: 25 G 1.5 in needle, superolateral approach Medications: 5 mL lidocaine 1 %; 1 mL bupivacaine 0.25 %; 80 mg methylPREDNISolone acetate 40 MG/ML    Consent was given by the patient. Immediately prior to procedure a time out was called to verify the correct patient, procedure, equipment, support staff and site/side marked as required. Patient was prepped and draped in the usual sterile fashion.          Clinical History: No specialty comments available.  She reports that she has been smoking cigarettes. She has been smoking an average of .25 packs per day. She has never used smokeless tobacco.  Recent Labs    11/20/21 1414  HGBA1C 5.5    Objective:   Vital Signs: Ht 5' (1.524 m)   Wt 232 lb (105.2 kg)   LMP  (LMP Unknown)   BMI 45.31 kg/m   Physical Exam  Gen: Well-appearing, in no acute distress; non-toxic CV: Regular Rate. Well-perfused. Warm.  Resp: Breathing unlabored on room air; no wheezing. Psych: Fluid speech  in conversation; appropriate affect; normal thought process Neuro: Sensation intact throughout. No gross coordination deficits.   Ortho Exam - Right knee: Examination of the right knee shows a trace knee effusion.  Redness or erythema.  Range of motion from 0-130 degrees.  There is some crepitus with knee flexion and extension.  There is a painful McMurray's on the medial joint line.  Positive TTP on the medial joint line.  No specific lateral joint line TTP.  5/5 throughout.  Neurovascular intact distally.  Imaging: [place] Extrem Low Right Ltd  Result Date:  05/24/2022 [facility] Limited knee ultrasound performed, Right Evaluation of the suprapatellar pouch and long axis shows a trace effusion.  Intact quadricep tendon.  No cortical irregularity of the patella.  Evaluation of the medial joint line and the medial meniscus shows what seems to be a degenerative tear of the medial meniscus with some outpouching and surrounding hypoechoic fluid.  Mild hyperemia in this region.  Lateral meniscus and lateral joint line visualized without any abnormality.  There is some swelling over the superior lateral aspect of the knee joint.    [place] Extrem Low Right Ltd [facility] Limited knee ultrasound performed, Right  Evaluation of the suprapatellar pouch and long axis shows a trace  effusion.  Intact quadricep tendon.  No cortical irregularity of the  patella.  Evaluation of the medial joint line and the medial meniscus  shows what seems to be a degenerative tear of the medial meniscus with  some outpouching and surrounding hypoechoic fluid.  Mild hyperemia in this  region.  Lateral meniscus and lateral joint line visualized without any  abnormality.  There is some swelling over the superior lateral aspect of  the knee joint.    Past Medical/Family/Surgical/Social History: Medications & Allergies reviewed per EMR, new medications updated. Patient Active Problem List   Diagnosis Date Noted   Lightheadedness 07/03/2021   Chronic midline low back pain with bilateral sciatica 04/19/2021   Class 3 severe obesity due to excess calories without serious comorbidity with body mass index (BMI) of 40.0 to 44.9 in adult [facility]) 04/19/2021   Vitamin D deficiency 01/04/2021   Abnormal liver enzymes 01/04/2021   OSA (obstructive sleep apnea) 06/21/2020   Generalized anxiety disorder 04/26/2020   Bipolar depression ([place]) 04/26/2020   GERD without esophagitis 01/22/2013   Past Medical History:  Diagnosis Date   Anxiety    Arthritis    Phreesia 03/08/2020   Bladder infection     Complication of anesthesia    Epidural did not work w/2nd preg. labor   Depression    No med. currently, Stopped with + UPT   Depression 06/27/2012   Gonorrhea    Hemorrhoids 06/27/2012   Osteoarthritis 06/27/2012   Osteoporosis    Phreesia 03/08/2020   Panic disorder 06/27/2012   Sleep apnea    Phreesia 03/08/2020   Family History  Problem Relation Age of Onset   Cancer Mother    Other Neg Hx    Alcohol abuse Neg Hx    Arthritis Neg Hx    Asthma Neg Hx    Birth defects Neg Hx    COPD Neg Hx    Depression Neg Hx    Drug abuse Neg Hx    Diabetes Neg Hx    Early death Neg Hx    Hearing loss Neg Hx    Heart disease Neg Hx    Hyperlipidemia Neg Hx    Hypertension Neg Hx    Kidney disease Neg Hx  Learning disabilities Neg Hx    Mental illness Neg Hx    Mental retardation Neg Hx    Miscarriages / Stillbirths Neg Hx    Stroke Neg Hx    Vision loss Neg Hx    Past Surgical History:  Procedure Laterality Date   BREAST MASS EXCISION     Benign   BREAST SURGERY N/A    Phreesia 03/08/2020   CESAREAN SECTION     X 1 1st preg., then VBAC   CESAREAN SECTION N/A 01/27/2013   Procedure: CESAREAN SECTION;  Surgeon: Shelly Bombard, MD;  Location: [facility];  Service: Obstetrics;  Laterality: N/A;   MOUTH SURGERY     Social History   Occupational History   Not on file  Tobacco Use   Smoking status: Every Day    Packs/day: 0.25    Types: Cigarettes   Smokeless tobacco: Never  Vaping Use   Vaping Use: Never used  Substance and Sexual Activity   Alcohol use: Yes    Comment: occ   Drug use: No   Sexual activity: Yes    Birth control/protection: None    Comment: Last intercourse at least 1 week ago

## 2022-05-24 NOTE — Patient Instructions (Signed)
Medication Instructions:  No changaes *If you need a refill on your cardiac medications before your next appointment, please call your pharmacy*   Lab Work: none If you have labs (blood work) drawn today and your tests are completely normal, you will receive your results only by: [place] (if you have MyChart) OR A paper copy in the mail If you have any lab test that is abnormal or we need to change your treatment, we will call you to review the results.   Testing/Procedures: none   Follow-Up: As needed  Important Information About Sugar

## 2022-05-25 ENCOUNTER — Encounter (HOSPITAL_COMMUNITY): Payer: Self-pay

## 2022-06-01 ENCOUNTER — Ambulatory Visit: Payer: 59 | Admitting: Rehabilitative and Restorative Service Providers"

## 2022-06-05 ENCOUNTER — Encounter: Payer: Self-pay | Admitting: Physical Therapy

## 2022-06-05 ENCOUNTER — Ambulatory Visit (INDEPENDENT_AMBULATORY_CARE_PROVIDER_SITE_OTHER): Payer: 59 | Admitting: Physical Therapy

## 2022-06-05 ENCOUNTER — Other Ambulatory Visit: Payer: Self-pay

## 2022-06-05 DIAGNOSIS — M6281 Muscle weakness (generalized): Secondary | ICD-10-CM | POA: Diagnosis not present

## 2022-06-05 DIAGNOSIS — M25561 Pain in right knee: Secondary | ICD-10-CM | POA: Diagnosis not present

## 2022-06-05 DIAGNOSIS — G8929 Other chronic pain: Secondary | ICD-10-CM

## 2022-06-05 NOTE — Therapy (Addendum)
OUTPATIENT PHYSICAL THERAPY LOWER EXTREMITY EVALUATION PT DISCHARGE SUMMARY 07/04/2022   Patient Name: Alicia [name] MRN: [id number] DOB:04-02-1981, 41 y.o., female 22 Date: 06/05/2022  PHYSICAL THERAPY DISCHARGE SUMMARY  Visits from Start of Care: 1  Current functional level related to goals / functional outcomes: Patient did not return to PT since initial evaluation so unknown status   Remaining deficits: Patient did not return to PT since initial evaluation so unknown status   Education / Equipment: Initial HEP   Patient agrees to discharge. Patient goals were not met. Patient is being discharged due to not returning since the last visit.   Jamey Reas, PT, DPT 07/04/2022, 9:57 AM     PT End of Session - 06/05/22 1517     Visit Number 1    Number of Visits 7    Date for PT Re-Evaluation 07/18/22    Authorization Type AET[employer]    Authorization Time Period $35 co-pay    PT Start Time 1430    PT Stop Time 1506    PT Time Calculation (min) 36 min    Activity Tolerance Patient tolerated treatment well;Patient limited by pain    Behavior During Therapy [facility] for tasks assessed/performed             Past Medical History:  Diagnosis Date   Anxiety    Arthritis    Phreesia 03/08/2020   Bladder infection    Complication of anesthesia    Epidural did not work w/2nd preg. labor   Depression    No med. currently, Stopped with + UPT   Depression 06/27/2012   Gonorrhea    Hemorrhoids 06/27/2012   Osteoarthritis 06/27/2012   Osteoporosis    Phreesia 03/08/2020   Panic disorder 06/27/2012   Sleep apnea    Phreesia 03/08/2020   Past Surgical History:  Procedure Laterality Date   BREAST MASS EXCISION     Benign   BREAST SURGERY N/A    Phreesia 03/08/2020   CESAREAN SECTION     X 1 1st preg., then VBAC   CESAREAN SECTION N/A 01/27/2013   Procedure: CESAREAN SECTION;  Surgeon: Shelly Bombard, MD;  Location: [facility];  Service: Obstetrics;  Laterality: N/A;    MOUTH SURGERY     Patient Active Problem List   Diagnosis Date Noted   Lightheadedness 07/03/2021   Chronic midline low back pain with bilateral sciatica 04/19/2021   Class 3 severe obesity due to excess calories without serious comorbidity with body mass index (BMI) of 40.0 to 44.9 in adult [facility]) 04/19/2021   Vitamin D deficiency 01/04/2021   Abnormal liver enzymes 01/04/2021   OSA (obstructive sleep apnea) 06/21/2020   Generalized anxiety disorder 04/26/2020   Bipolar depression ([place]) 04/26/2020   GERD without esophagitis 01/22/2013    PCP: Dorna Mai, MD  REFERRING PROVIDER: Elba Barman, DO  REFERRING DIAG:  [phone] (ICD-10-CM) - Chronic pain of right knee  M25.461 (ICD-10-CM) - Effusion, right knee    THERAPY DIAG:  Chronic pain of right knee  Muscle weakness (generalized)  Rationale for Evaluation and Treatment Rehabilitation  ONSET DATE: 05/24/2022 MD referral to PT  SUBJECTIVE:   SUBJECTIVE STATEMENT: This 41yo female was seen on 05/24/2022 by Elba Barman, DO with knee for last few months with some history of knee giving out. She had knee injection on 05/24/2022. She has history of OA. Her right knee started giving out denies falls.   PERTINENT HISTORY: Chronic LBP, obesity, Bipolar depression, anxiety,  arthritis, osteoporosis  PAIN:  Are you having pain? Yes: NPRS scale: 0/10, in last week up to 5/10 Pain location: right knee medially & laterally Pain description: dull achy throbbing Aggravating factors: standing > couple of hours like at work Relieving factors: getting off feet starts to decrease at 35-40 minutes  PRECAUTIONS: None  WEIGHT BEARING RESTRICTIONS No  FALLS:  Has patient fallen in last 6 months? 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  Intact quadricep tendon.  No cortical irregularity of the  patella.  Evaluation of the medial joint line and the medial meniscus  shows what seems to be a degenerative tear of the medial meniscus with  some outpouching and surrounding hypoechoic fluid.  Mild hyperemia in this  region.  Lateral meniscus and lateral joint line visualized without any  abnormality.  There is some swelling over the superior lateral aspect of  the knee joint.  PATIENT SURVEYS:  06/05/2022:  FOTO 52% with target 63%  COGNITION: Overall cognitive status: Within functional limits for tasks assessed    EDEMA:  Circumferential above knee 47cm both RLE & LLE   Around knee RLE 44cm  LLE 43.4cm Mild effusion noted medial joint line.  MUSCLE LENGTH: Hamstrings: nip flexed to 90* then ext knee Right -10* deg;   POSTURE: rounded shoulders and wide stance with external rotation & overpronation  PALPATION: No tenderness in quads including quad & patella tendons, hamstrings including tendons or gastroc. Tenderness along medial joint line.   LOWER EXTREMITY ROM:  ROM P:passive  A:active Right Eval 06/05/22 Left Eval 06/05/22  Hip flexion    Hip extension    Hip abduction    Hip adduction    Hip internal rotation    Hip external rotation    Knee flexion    Knee extension Seated A: -2*  Eccentric painful   Ankle dorsiflexion    Ankle plantarflexion    Ankle inversion    Ankle eversion     (Blank rows = not tested)  LOWER EXTREMITY MMT:  MMT Right Eval 06/05/22 Left Eval 06/05/22  Hip flexion    Hip extension    Hip abduction    Hip adduction    Hip internal rotation    Hip external  rotation    Knee flexion 4/5   Knee extension 4/5   Ankle dorsiflexion    Ankle plantarflexion 3/5 3 single leg heel raise   Ankle inversion    Ankle eversion     (Blank rows = not tested)  LOWER EXTREMITY SPECIAL TESTS:  Knee special tests: Patellafemoral grind test: negative  FUNCTIONAL TESTS:  Single leg stance RLE 11sec  GAIT: 06/05/22:  Distance walked: >100' Assistive device utilized: None Level of assistance: Modified independence / increased time with slow cautious gait Comments: antalgic with decreased stance RLE, wide base  TODAY'S TREATMENT: 06/05/22:   Therex:         HEP instruction/performance c cues for techniques, handout provided.  Trial set performed of each for comprehension and symptom assessment.  See below for exercise list.  Noted pain with all eccentric quad activity.  Self-care: PT demo & verbal cues on using pillows for positioning in sidelying and "tenting" sheets off feet.  PT demo & verbal cues on using 4" block when sitting to limit LEs dangling.  PT had pt sit with feet supported for >5 min during subjective eval and reports improved knee discomfort.    PATIENT EDUCATION:  Education details: HEP & self-care noted above Person educated: Patient Education method: Consulting civil engineer, Demonstration, Verbal cues, and Handouts Education comprehension: verbalized understanding, returned demonstration, verbal cues required, and needs further education   HOME EXERCISE PROGRAM: Access Code: [email or web address] URL: [email or web address] Date: 06/05/2022 Prepared by: Jamey Reas  Exercises - Supine Knee Extension Strengthening  - 1 x daily - 7 x weekly - 2-3 sets - 10 reps - 5 seconds hold - Supine Active Straight Leg Raise  - 1 x daily - 7 x weekly - 2-3 sets - 5 reps - 5 seconds hold - Supine Heel Slide with Small Ball  - 1 x daily - 7 x weekly - 2-3 sets - 10 reps - 5 seconds hold - Heel raises near counter  - 1 x daily - 7 x weekly - 2-3 sets - 10  reps - 5 seconds hold  ASSESSMENT:  CLINICAL IMPRESSION: Patient is a 41 y.o. female who was seen today for physical therapy evaluation and treatment for chronic right knee pain & effusion. She has pain with eccentric quad motions and reports pain with prolonged standing at work and other mobility.  She has weakness in right knee.  Patient would benefit from skilled PT to improve function with less pain.    OBJECTIVE IMPAIRMENTS Abnormal gait, decreased activity tolerance, decreased endurance, decreased knowledge of condition, decreased strength, increased edema, impaired flexibility, obesity, and pain.   ACTIVITY LIMITATIONS carrying, lifting, bending, standing, sleeping, stairs, locomotion level, and standing tolerance  PARTICIPATION LIMITATIONS: community activity and occupation  [place], Past/current experiences, and 1-2 comorbidities: see PMH  are also affecting patient's functional outcome.   REHAB POTENTIAL: Good  CLINICAL DECISION MAKING: Stable/uncomplicated  EVALUATION COMPLEXITY: Low   GOALS: Goals reviewed with patient? Yes  SHORT TERM GOALS: Target date: 06/29/2022 Patient demo & verbal understanding of HEP & compliance. Baseline:  pt is dependent in proper exercises.  Goal status: INITIAL  2.  Patient reports pain improved >25% from initial eval.  Baseline: see subjective Goal status: INITIAL   LONG TERM GOALS: Target date: 07/18/2022  FOTO >/= 63% Baseline: see objective Goal status: INITIAL  2.  Patient reports pain improved >50% with functional activities Baseline: see subjective Goal status: INITIAL  3.  Patient verbalizes understanding of ongoing HEP. Baseline: dependent in appropriate exercises for condition Goal status: INITIAL  4.  Knee ext strength 5/5 Baseline: see objective Goal status: INITIAL   PLAN: PT FREQUENCY: 1x/week  due to co-pay  PT DURATION: 6 weeks  PLANNED INTERVENTIONS: Therapeutic exercises, Therapeutic  activity, Neuromuscular re-education, Balance training, Gait training, Patient/Family education, Self Care, Joint mobilization, Stair training, Aquatic Therapy, Dry Needling, Electrical stimulation, Cryotherapy, Moist heat, Taping, Ionotophoresis 24m/ml Dexamethasone, and Manual therapy  PLAN FOR NEXT SESSION: check & update HEP,  manual therapy & modalities as indicated for pain with home instruction as able   RJamey Reas PT, DPT 06/05/2022, 3:42 PM

## 2022-06-06 ENCOUNTER — Telehealth (HOSPITAL_COMMUNITY): Payer: Self-pay | Admitting: Physician Assistant

## 2022-06-19 ENCOUNTER — Other Ambulatory Visit: Payer: Self-pay | Admitting: Family Medicine

## 2022-06-20 ENCOUNTER — Other Ambulatory Visit: Payer: Self-pay | Admitting: Family Medicine

## 2022-06-20 DIAGNOSIS — G8929 Other chronic pain: Secondary | ICD-10-CM

## 2022-06-21 ENCOUNTER — Ambulatory Visit: Payer: 59 | Admitting: Sports Medicine

## 2022-06-21 ENCOUNTER — Encounter: Payer: Self-pay | Admitting: Sports Medicine

## 2022-06-21 DIAGNOSIS — G8929 Other chronic pain: Secondary | ICD-10-CM | POA: Diagnosis not present

## 2022-06-21 DIAGNOSIS — M25562 Pain in left knee: Secondary | ICD-10-CM | POA: Diagnosis not present

## 2022-06-21 DIAGNOSIS — M25561 Pain in right knee: Secondary | ICD-10-CM | POA: Diagnosis not present

## 2022-06-21 DIAGNOSIS — M25462 Effusion, left knee: Secondary | ICD-10-CM | POA: Diagnosis not present

## 2022-06-21 MED ORDER — MELOXICAM 15 MG PO TABS
15.0000 mg | ORAL_TABLET | Freq: Every day | ORAL | 1 refills | Status: DC
Start: 2022-06-21 — End: 2023-08-27

## 2022-06-21 NOTE — Progress Notes (Signed)
Follow up on right knee; also here today because the left one is giving her problems  States she thinks there is fluid on the left knee  Advil for pain; not much relief Has only had 1 visit of PT; but was given a HEP to do

## 2022-06-21 NOTE — Progress Notes (Signed)
Alicia [name] - 41 y.o. female MRN [id number]  Date of birth: 06-18-1981  Office Visit Note: Visit Date: 06/21/2022 PCP: Dorna Mai, MD Referred by: Dorna Mai, MD  Subjective: Chief Complaint  Patient presents with   Right Knee - Pain   Left Knee - Pain   HPI: Alicia [name] is a pleasant 41 y.o. female who presents today for follow-up of right knee pain and newer-onset left knee pain.  Right knee pain - had 2 days of pain after injection, but now feeling much better.  Continuing with occasional home exercises.  Left knee pain - noticed the left knee pain after her right knee started feeling better.  Pain is across the anterior knee both on the medial and lateral compartments.  Sometimes feels like the knee will give out on her like her right knee had in the past.  She did have 1 session of formalized physical therapy, but this was expensive so transition to home exercises.  Does note some swelling on the left knee.  No redness.  Pertinent ROS were reviewed with the patient and found to be negative unless otherwise specified above in HPI.   Assessment & Plan: Visit Diagnoses:  1. Pain and swelling of left knee   2. Chronic pain of right knee    Plan: Glad that her right knee is feeling better after the injection, she is to continue home therapy to work on strengthening the knees to offload the joint itself.  Her left knee does have a small effusion on it and has been acting up on her.  She is interested in trying a one-time corticosteroid injection, however she would like to wait till she has 2 days off given that she had some soreness from her previous one.  We will start her on meloxicam 15 mg once daily to be taken for the next week and then as needed to try to calm down the pain and swelling.  Continue home exercises, and if this is not getting her over the top she may present when works for her for injection into the left knee.  Follow-up: Return for For knee injection  with Dr. Rolena Infante when works for your schedule.   Meds & Orders:  Meds ordered this encounter  Medications   meloxicam (MOBIC) 15 MG tablet    Sig: Take 1 tablet (15 mg total) by mouth daily.    Dispense:  30 tablet    Refill:  1   No orders of the defined types were placed in this encounter.    Procedures: No procedures performed      Clinical History: No specialty comments available.  She reports that she has been smoking cigarettes. She has been smoking an average of .25 packs per day. She has never used smokeless tobacco.  Recent Labs    11/20/21 1414  HGBA1C 5.5    Objective:   Vital Signs: LMP  (LMP Unknown)   Physical Exam  Gen: Well-appearing, in no acute distress; non-toxic CV: Regular Rate. Well-perfused. Warm.  Resp: Breathing unlabored on room air; no wheezing. Psych: Fluid speech in conversation; appropriate affect; normal thought process Neuro: Sensation intact throughout. No gross coordination deficits.   Ortho Exam - Bilateral knees: Right knee without effusion or redness.  Left knee with a small effusion.  There is some generalized TTP that the medial lateral joint line of the left knee.  Negative McMurray's testing.  Range of motion preserved to both knees from 0-135 degrees.  Pain 5/5 throughout.  Neurovascular intact distally.  Imaging: No results found.  Past Medical/Family/Surgical/Social History: Medications & Allergies reviewed per EMR, new medications updated. Patient Active Problem List   Diagnosis Date Noted   Lightheadedness 07/03/2021   Chronic midline low back pain with bilateral sciatica 04/19/2021   Class 3 severe obesity due to excess calories without serious comorbidity with body mass index (BMI) of 40.0 to 44.9 in adult [facility]) 04/19/2021   Vitamin D deficiency 01/04/2021   Abnormal liver enzymes 01/04/2021   OSA (obstructive sleep apnea) 06/21/2020   Generalized anxiety disorder 04/26/2020   Bipolar depression ([place]) 04/26/2020    GERD without esophagitis 01/22/2013   Past Medical History:  Diagnosis Date   Anxiety    Arthritis    [employer]reesia 03/08/2020   Bladder infection    Complication of anesthesia    Epidural did not work w/2nd preg. labor   Depression    No med. currently, Stopped with + UPT   Depression 06/27/2012   Gonorrhea    Hemorrhoids 06/27/2012   Osteoarthritis 06/27/2012   Osteoporosis    [employer]reesia 03/08/2020   Panic disorder 06/27/2012   Sleep apnea    [employer]reesia 03/08/2020   Family History  Problem Relation Age of Onset   Cancer Mother    Other Neg Hx    Alcohol abuse Neg Hx    Arthritis Neg Hx    Asthma Neg Hx    Birth defects Neg Hx    COPD Neg Hx    Depression Neg Hx    Drug abuse Neg Hx    Diabetes Neg Hx    Early death Neg Hx    Hearing loss Neg Hx    Heart disease Neg Hx    Hyperlipidemia Neg Hx    Hypertension Neg Hx    Kidney disease Neg Hx    Learning disabilities Neg Hx    Mental illness Neg Hx    Mental retardation Neg Hx    Miscarriages / Stillbirths Neg Hx    Stroke Neg Hx    Vision loss Neg Hx    Past Surgical History:  Procedure Laterality Date   BREAST MASS EXCISION     Benign   BREAST SURGERY N/A    [employer]reesia 03/08/2020   CESAREAN SECTION     X 1 1st preg., then VBAC   CESAREAN SECTION N/A 01/27/2013   Procedure: CESAREAN SECTION;  Surgeon: Shelly Bombard, MD;  Location: [facility];  Service: Obstetrics;  Laterality: N/A;   MOUTH SURGERY     Social History   Occupational History   Not on file  Tobacco Use   Smoking status: Every Day    Packs/day: 0.25    Types: Cigarettes   Smokeless tobacco: Never  Vaping Use   Vaping Use: Never used  Substance and Sexual Activity   Alcohol use: Yes    Comment: occ   Drug use: No   Sexual activity: Yes    Birth control/protection: None    Comment: Last intercourse at least 1 week ago

## 2022-06-26 ENCOUNTER — Other Ambulatory Visit: Payer: Self-pay | Admitting: Family Medicine

## 2022-06-26 ENCOUNTER — Other Ambulatory Visit: Payer: Self-pay | Admitting: Physician Assistant

## 2022-06-26 DIAGNOSIS — G8929 Other chronic pain: Secondary | ICD-10-CM

## 2022-06-26 DIAGNOSIS — K219 Gastro-esophageal reflux disease without esophagitis: Secondary | ICD-10-CM

## 2022-06-27 MED ORDER — PANTOPRAZOLE SODIUM 40 MG PO TBEC: 40.0000 mg | DELAYED_RELEASE_TABLET | Freq: Every day | ORAL | 0 refills | Status: DC

## 2022-07-04 IMAGING — US US OB COMP LESS 14 WK
1 series · 15 of 26 positions shown · non-contrast
Comparison: None.

CLINICAL DATA: Vaginal bleeding during pregnancy.

EXAM:
OBSTETRIC <14 WK ULTRASOUND
TECHNIQUE: Transabdominal ultrasound was performed for evaluation of the
gestation as well as the maternal uterus and adnexal regions.

[Series 1: us ob comp less 14 wk · 26 acquisitions, 15 frames shown]
[im 1/26]
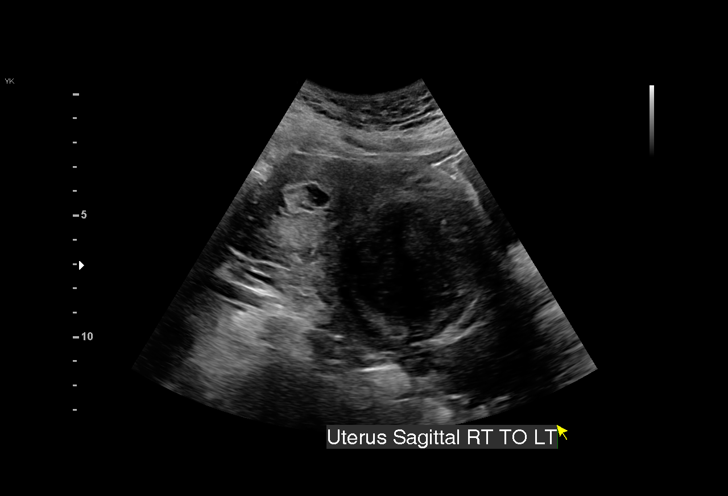
[im 3/26]
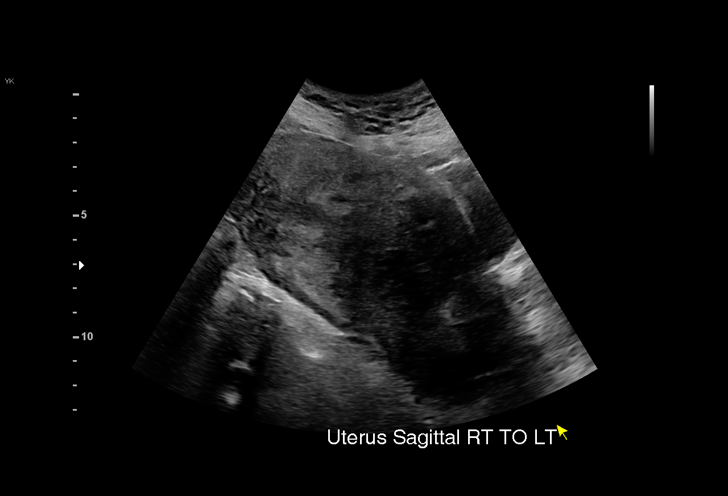
[im 5/26]
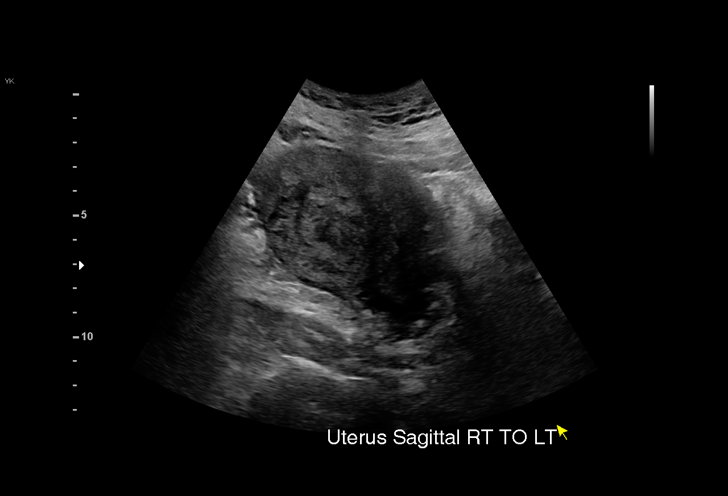
[im 7/26]
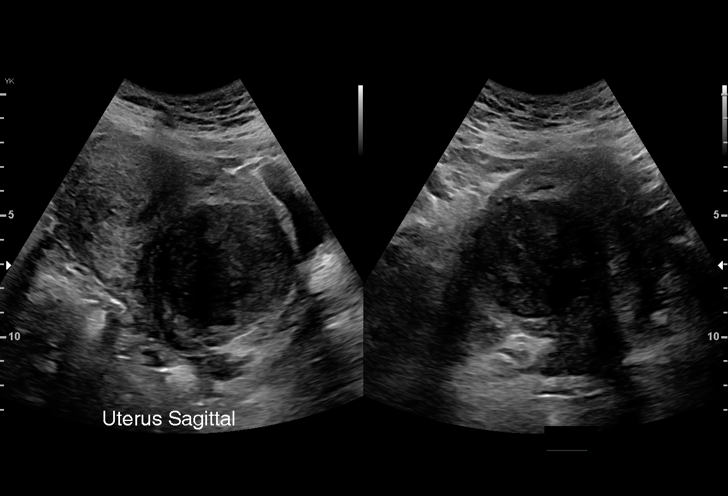
[im 8/26]
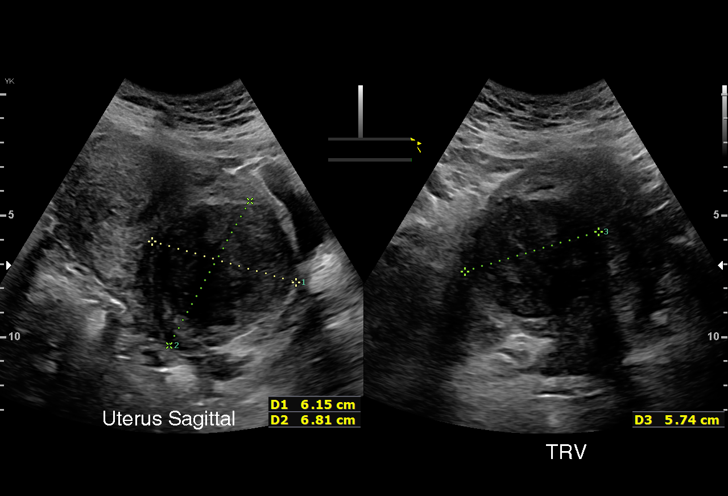
[im 10/26]
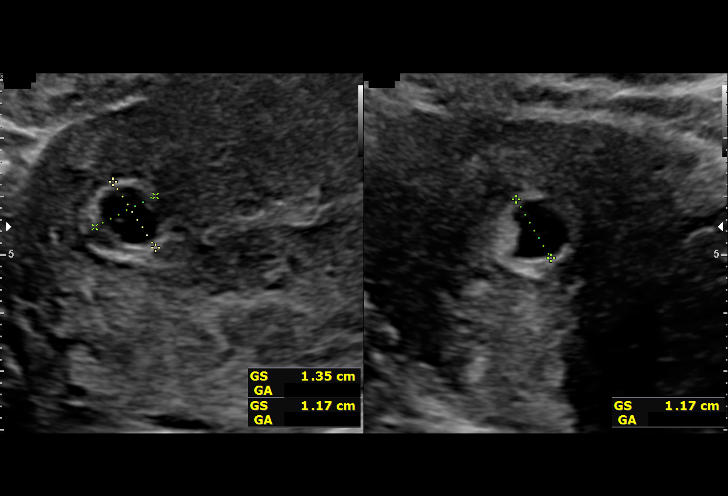
[im 12/26]
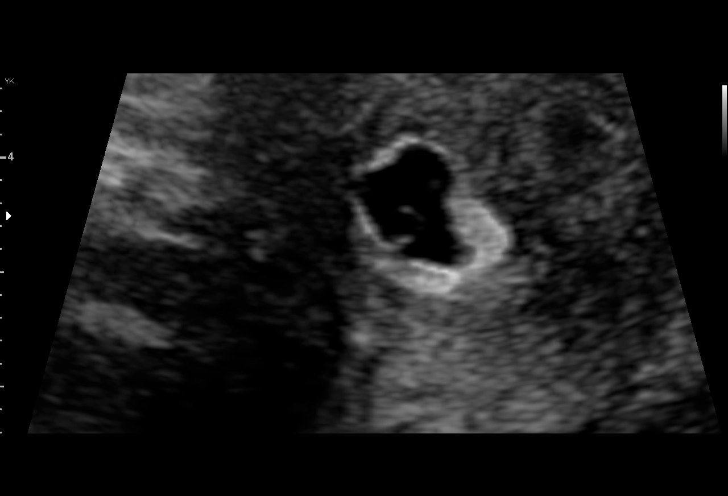
[im 14/26]
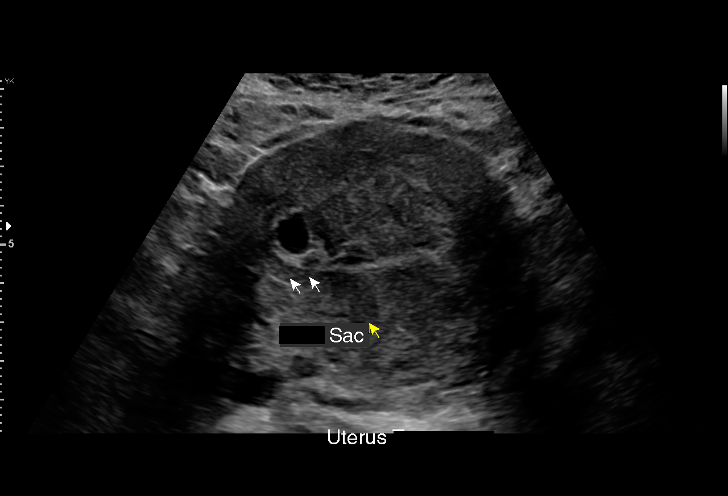
[im 15/26]
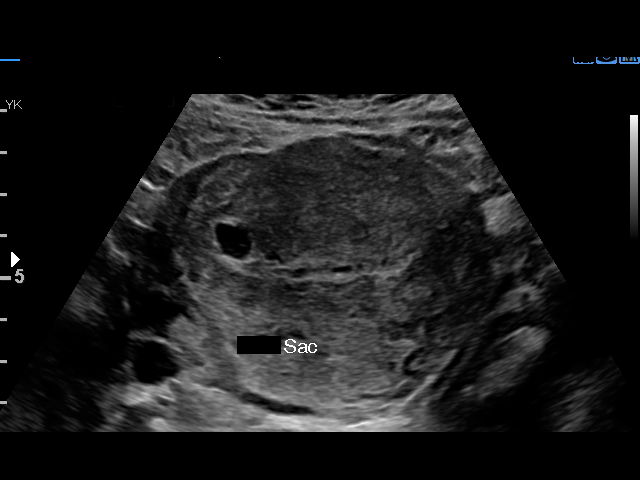
[im 17/26]
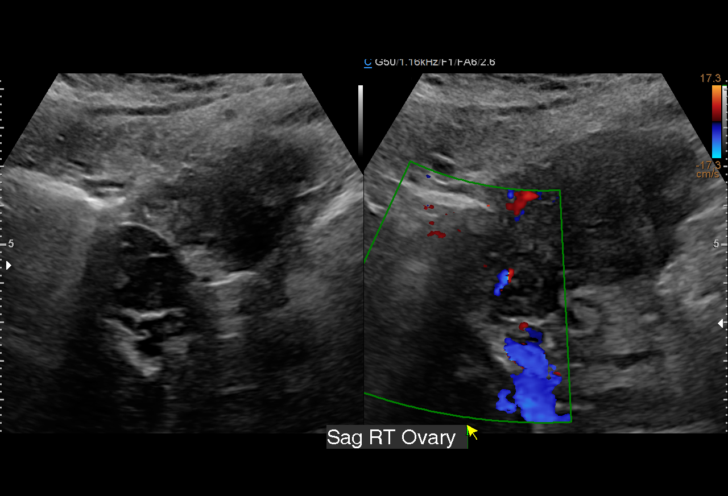
[im 19/26]
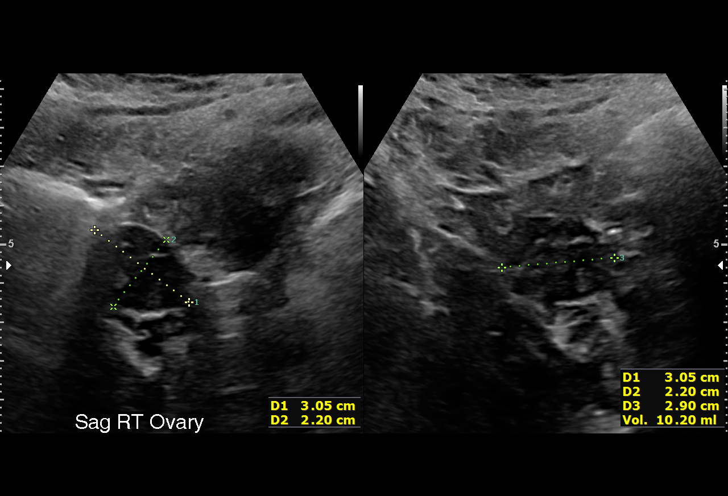
[im 20/26]
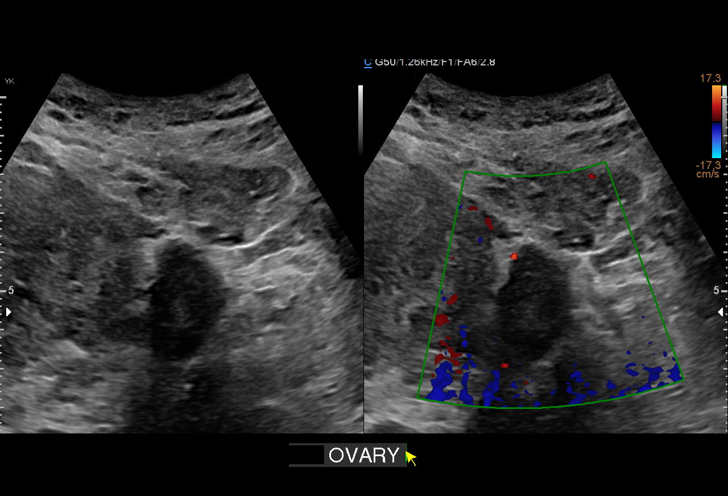
[im 22/26]
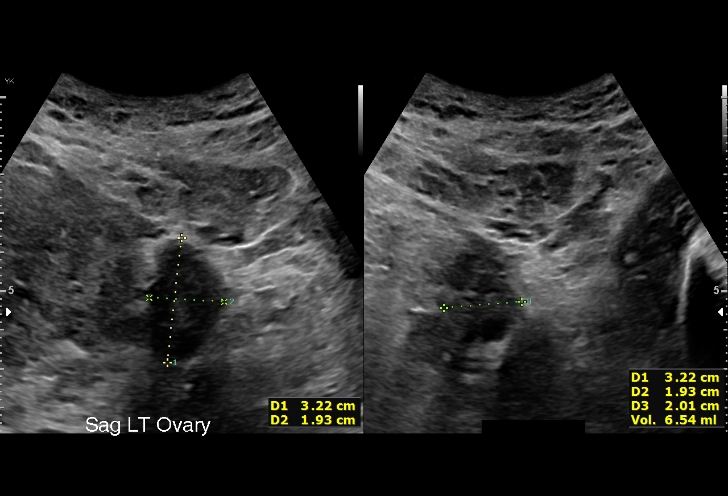
[im 24/26]
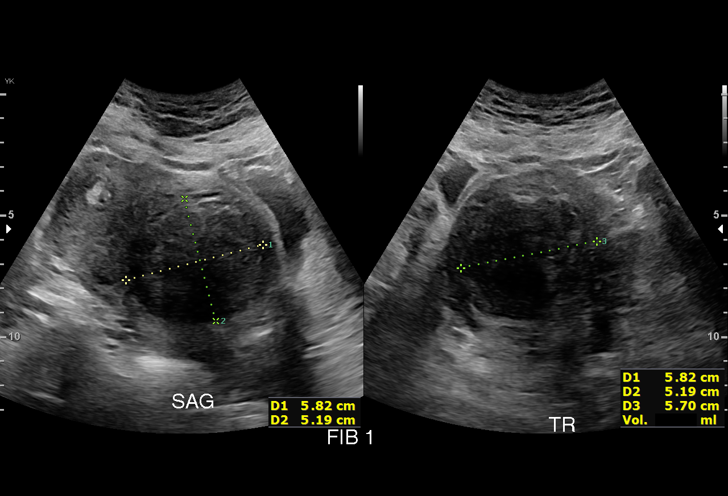
[im 26/26]
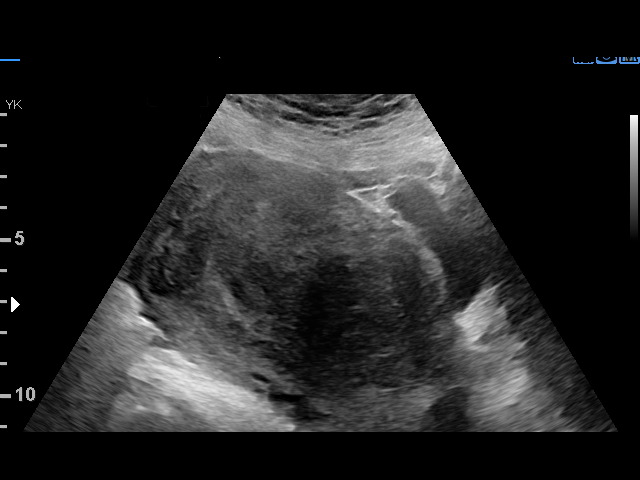

[15 of 26 positions shown; findings below may reference images not displayed]

FINDINGS: Intrauterine gestational sac: Single

Yolk sac:  Visualized.

Embryo:  Not Visualized.

MSD:  12.3 cm mm   6 w   0 d

Subchorionic hemorrhage:  Small.

Maternal uterus/adnexae: Bilateral ovaries are within normal limits.
2 fibroids are identified. Intramural fibroid in the right body
measures 6.2 x 6.8 x 5.7 cm. Intramural fibroid posteriorly measures
2.3 x 1.9 x 1.4 cm.
IMPRESSION: 1. Single intrauterine gestational sac measuring 6 weeks 0 days by
mean gestational sac diameter. Yolk sac present. No fetal pole
identified at this time which is within normal limits for a sac of
this size. Recommend follow-up quantitative B-HCG levels and
follow-up US in 14 days to assess viability. This recommendation
follows SRU consensus guidelines: Diagnostic Criteria for Nonviable
Pregnancy Early in the First Trimester. N Engl J Med 5144;
2. Small subchorionic hemorrhage.
3. Uterine fibroids measuring up to 6.8 cm.

## 2022-07-10 ENCOUNTER — Ambulatory Visit: Payer: 59 | Admitting: Sports Medicine

## 2022-07-12 ENCOUNTER — Encounter: Payer: Self-pay | Admitting: Sports Medicine

## 2022-07-12 ENCOUNTER — Ambulatory Visit: Payer: 59 | Admitting: Sports Medicine

## 2022-07-12 DIAGNOSIS — M25561 Pain in right knee: Secondary | ICD-10-CM

## 2022-07-12 DIAGNOSIS — M25562 Pain in left knee: Secondary | ICD-10-CM

## 2022-07-12 DIAGNOSIS — M25462 Effusion, left knee: Secondary | ICD-10-CM | POA: Diagnosis not present

## 2022-07-12 DIAGNOSIS — G8929 Other chronic pain: Secondary | ICD-10-CM

## 2022-07-12 MED ORDER — LIDOCAINE HCL 1 % IJ SOLN
2.0000 mL | INTRAMUSCULAR | Status: AC | PRN
  Administered 2022-07-12: 2 mL

## 2022-07-12 MED ORDER — METHYLPREDNISOLONE ACETATE 40 MG/ML IJ SUSP
80.0000 mg | INTRAMUSCULAR | Status: AC | PRN
  Administered 2022-07-12: 80 mg via INTRA_ARTICULAR

## 2022-07-12 MED ORDER — BUPIVACAINE HCL 0.25 % IJ SOLN
2.0000 mL | INTRAMUSCULAR | Status: AC | PRN
  Administered 2022-07-12: 2 mL via INTRA_ARTICULAR

## 2022-07-12 NOTE — Progress Notes (Signed)
Alicia [name] - 41 y.o. female MRN [id number]  Date of birth: 05-20-1981  Office Visit Note: Visit Date: 07/12/2022 PCP: Dorna Mai, MD Referred by: Dorna Mai, MD  Subjective: Chief Complaint  Patient presents with   Left Knee - Pain   HPI: Alicia [name] is a pleasant 41 y.o. female who presents today for chronic left knee pain and f/u right knee pain.  Initially saw me back in September she is having right knee issues.  She has been doing some home exercise therapy and back last month we did do an injection into the right knee which helped improve her pain quite significantly.  She is now having some left-sided knee pain.  Pain is noted over the anterior and both medial and lateral aspect.  She is interested in trying the injection on this side as well.  She has not been taking the meloxicam 15 mg due to a concern for reaction with her smoking.  She denies any new injury to the left knee.  Feels like it intermittently will have some swelling on the medial aspect of the knee.  Feels like it is doing pretty well today.  Her birthday is coming up and she is going to show up for Thanksgiving and would like to be feeling well at this time.  Pertinent ROS were reviewed with the patient and found to be negative unless otherwise specified above in HPI.   Assessment & Plan: Visit Diagnoses:  1. Pain and swelling of left knee   2. Chronic pain of right knee   3. Chronic pain of left knee    Plan: The right knee is doing well, through shared decision making we elected to proceed with corticosteroid injection into the left knee to try to get this feeling better as well, especially given some of her swelling.  She is to continue her home exercises for quad and knee strengthening for both knees starting this weekend.  May use ice and/or meloxicam or Tylenol for any postinjection pain. I did reassure her that it is fine to take the meloxicam medicine given her concomitant smoking, she  will begin this in the coming days.  She will follow-up with me as needed.  Follow-up: Return if symptoms worsen or fail to improve.   Meds & Orders: No orders of the defined types were placed in this encounter.   Orders Placed This Encounter  Procedures   Large Joint Inj: R knee     Procedures: Large Joint Inj: R knee on 07/12/2022 2:51 PM Details: 22 G 1.5 in needle, anteromedial approach Medications: 2 mL lidocaine 1 %; 2 mL bupivacaine 0.25 %; 80 mg methylPREDNISolone acetate 40 MG/ML Outcome: tolerated well, no immediate complications  Knee Injection, left: After discussion on risks/benefits/indications, informed verbal consent was obtained and a timeout was performed, patient was seated on exam table. The patient's knee was prepped with Betadine and alcohol swab and utilizing anteromedial approach, the patient's knee was injected intraarticularly with 2:2:2 lidocaine 1%:bupivicaine 0.25%:depomedrol. Patient tolerated the procedure well without immediate complications.  Procedure, treatment alternatives, risks and benefits explained, specific risks discussed. Consent was given by the patient. Immediately prior to procedure a time out was called to verify the correct patient, procedure, equipment, support staff and site/side marked as required. Patient was prepped and draped in the usual sterile fashion.          Clinical History: No specialty comments available.  She reports that she has been smoking cigarettes. She has  been smoking an average of .25 packs per day. She has never used smokeless tobacco.  Recent Labs    11/20/21 1414  HGBA1C 5.5    Objective:   Vital Signs: LMP  (LMP Unknown)   Physical Exam  Gen: Well-appearing, in no acute distress; non-toxic CV: Regular Rate. Well-perfused. Warm.  Resp: Breathing unlabored on room air; no wheezing. Psych: Fluid speech in conversation; appropriate affect; normal thought process Neuro: Sensation intact throughout. No  gross coordination deficits.   Ortho Exam -Bilateral knees: Both knees without significant redness or effusion.  There are some small swelling on the medial aspect of the left joint space.  Positive TTP at this region.  Negative McMurray's testing.  Range of motion preserved to both knees from 0-135 degrees.  Strength 5/5 throughout. NVI.  Imaging: No results found.  Past Medical/Family/Surgical/Social History: Medications & Allergies reviewed per EMR, new medications updated. Patient Active Problem List   Diagnosis Date Noted   Lightheadedness 07/03/2021   Chronic midline low back pain with bilateral sciatica 04/19/2021   Class 3 severe obesity due to excess calories without serious comorbidity with body mass index (BMI) of 40.0 to 44.9 in adult [facility]) 04/19/2021   Vitamin D deficiency 01/04/2021   Abnormal liver enzymes 01/04/2021   OSA (obstructive sleep apnea) 06/21/2020   Generalized anxiety disorder 04/26/2020   Bipolar depression ([place]) 04/26/2020   GERD without esophagitis 01/22/2013   Past Medical History:  Diagnosis Date   Anxiety    Arthritis    [employer]reesia 03/08/2020   Bladder infection    Complication of anesthesia    Epidural did not work w/2nd preg. labor   Depression    No med. currently, Stopped with + UPT   Depression 06/27/2012   Gonorrhea    Hemorrhoids 06/27/2012   Osteoarthritis 06/27/2012   Osteoporosis    [employer]reesia 03/08/2020   Panic disorder 06/27/2012   Sleep apnea    [employer]reesia 03/08/2020   Family History  Problem Relation Age of Onset   Cancer Mother    Other Neg Hx    Alcohol abuse Neg Hx    Arthritis Neg Hx    Asthma Neg Hx    Birth defects Neg Hx    COPD Neg Hx    Depression Neg Hx    Drug abuse Neg Hx    Diabetes Neg Hx    Early death Neg Hx    Hearing loss Neg Hx    Heart disease Neg Hx    Hyperlipidemia Neg Hx    Hypertension Neg Hx    Kidney disease Neg Hx    Learning disabilities Neg Hx    Mental illness Neg Hx    Mental  retardation Neg Hx    Miscarriages / Stillbirths Neg Hx    Stroke Neg Hx    Vision loss Neg Hx    Past Surgical History:  Procedure Laterality Date   BREAST MASS EXCISION     Benign   BREAST SURGERY N/A    [employer]ia 03/08/2020   CESAREAN SECTION     X 1 1st preg., then VBAC   CESAREAN SECTION N/A 01/27/2013   Procedure: CESAREAN SECTION;  Surgeon: Shelly Bombard, MD;  Location: [facility];  Service: Obstetrics;  Laterality: N/A;   MOUTH SURGERY     Social History   Occupational History   Not on file  Tobacco Use   Smoking status: Every Day    Packs/day: 0.25    Types: Cigarettes   Smokeless tobacco:  Never  Vaping Use   Vaping Use: Never used  Substance and Sexual Activity   Alcohol use: Yes    Comment: occ   Drug use: No   Sexual activity: Yes    Birth control/protection: None    Comment: Last intercourse at least 1 week ago

## 2022-07-12 NOTE — Progress Notes (Signed)
Here today for left knee cortisone injection Had one in the right which helped

## 2022-07-24 ENCOUNTER — Other Ambulatory Visit (HOSPITAL_COMMUNITY): Payer: Self-pay | Admitting: Physician Assistant

## 2022-07-24 DIAGNOSIS — F411 Generalized anxiety disorder: Secondary | ICD-10-CM

## 2022-08-03 ENCOUNTER — Other Ambulatory Visit: Payer: Self-pay | Admitting: Family Medicine

## 2022-08-03 ENCOUNTER — Telehealth (INDEPENDENT_AMBULATORY_CARE_PROVIDER_SITE_OTHER): Payer: 59 | Admitting: Psychiatry

## 2022-08-03 DIAGNOSIS — K219 Gastro-esophageal reflux disease without esophagitis: Secondary | ICD-10-CM

## 2022-08-03 DIAGNOSIS — F319 Bipolar disorder, unspecified: Secondary | ICD-10-CM | POA: Diagnosis not present

## 2022-08-03 DIAGNOSIS — R69 Illness, unspecified: Secondary | ICD-10-CM | POA: Diagnosis not present

## 2022-08-03 DIAGNOSIS — F411 Generalized anxiety disorder: Secondary | ICD-10-CM

## 2022-08-03 MED ORDER — HYDROXYZINE HCL 25 MG PO TABS: 25.0000 mg | ORAL_TABLET | Freq: Three times a day (TID) | ORAL | 1 refills | Status: DC | PRN

## 2022-08-03 MED ORDER — ARIPIPRAZOLE 5 MG PO TABS: 5.0000 mg | ORAL_TABLET | Freq: Every day | ORAL | 2 refills | Status: AC

## 2022-08-03 NOTE — Progress Notes (Addendum)
BH MD/PA/NP OP Progress Note  Virtual Visit via Video Note  I connected with Alicia [name] on 08/03/22 at 11:00 AM EST by video and verified that I am speaking with the correct person using two identifiers.  Location: Patient: Home Provider: Clinic   I discussed the limitations, risks, security and privacy concerns of performing an evaluation and management service by telephone and the availability of in person appointments. I also discussed with the patient that there may be a patient responsible charge related to this service. The patient expressed understanding and agreed to proceed.  Follow Up Instructions:  I discussed the assessment and treatment plan with the patient. The patient was provided an opportunity to ask questions and all were answered. The patient agreed with the plan and demonstrated an understanding of the instructions.   The patient was advised to call back or seek an in-person evaluation if the symptoms worsen or if the condition fails to improve as anticipated.  I provided 9 minutes of non-face-to-face time during this encounter.  Armando Reichert, MD    08/03/2022 1:19 PM Alicia [name]  MRN:  [id number]  Chief Complaint:  Chief Complaint  Patient presents with   Follow-up   Medication Refill   HPI:  Alicia [name] is a 41 year old female with a past psychiatric history significant for generalized anxiety disorder and bipolar depression who presents to [facility] via virtual video visit for follow-up and medication management.  Patient was following [place].    Pt reports that her mood is "better".  She reports that she has been off her medications for 1 month.  She last took Paxil and Abilify more than a month ago and has not taken her Klonopin since last Monday (11 days ago).  She reports that recently she was having a lot of pain in her knees so she had to get injections.  She denies any other stressors.  She  reports that her mood has been stable but sometimes she feels anxious, gets panic attacks and also has racing thoughts.  She reports that Abilify was helping her with her racing thoughts and irritability.  She reports that sometimes she gets angry at work and acts crazy and her coworkers can see the difference.  Currently, she denies any suicidal ideations, homicidal ideations, auditory and visual hallucinations.  She denies paranoia.  She denies any medication side effects and has been tolerating it well.  She does not remember what symptoms she was having when she was diagnosed with bipolar.  She reports that in the past there was a period in November 28, 2014 when her husband passed away when she was not sleeping at night but was sleeping during the daytime.  She reports that at that time she was feeling sad and was isolating herself. She reports that she wants to restart her medications and asking for Klonopin.  Discussed dependence, resistance, abuse potential of benzodiazepines.  She reports that she has not tried hydroxyzine in the past.  She reports that she wakes up all of the night multiple times.  She reports stable appetite.  Discussed that we can start her Abilify for mood stabilization and then consider starting her on antidepressant at next visit.  She agrees with the plan.  She denies drinking alcohol  and denies using any illicit drugs.  She smoke 3-4 cigarettes before going to sleep.    She denies any other concerns. Patient is alert and oriented x 4,  calm, cooperative,  and fully engaged in conversation during the encounter.  Her thought process is linear with coherent speech . She does not appear to be responding to internal/external stimuli .    Visit Diagnosis:    ICD-10-CM   1. Generalized anxiety disorder  F41.1 hydrOXYzine (ATARAX) 25 MG tablet    2. Bipolar depression ([place])  F31.9 ARIPiprazole (ABILIFY) 5 MG tablet      Past Psychiatric History:  Generalized anxiety disorder Bipolar  disorder  Past Medical History:  Past Medical History:  Diagnosis Date   Anxiety    Arthritis    Phreesia 03/08/2020   Bladder infection    Complication of anesthesia    Epidural did not work w/2nd preg. labor   Depression    No med. currently, Stopped with + UPT   Depression 06/27/2012   Gonorrhea    Hemorrhoids 06/27/2012   Osteoarthritis 06/27/2012   Osteoporosis    Phreesia 03/08/2020   Panic disorder 06/27/2012   Sleep apnea    Phreesia 03/08/2020    Past Surgical History:  Procedure Laterality Date   BREAST MASS EXCISION     Benign   BREAST SURGERY N/A    Phreesia 03/08/2020   CESAREAN SECTION     X 1 1st preg., then VBAC   CESAREAN SECTION N/A 01/27/2013   Procedure: CESAREAN SECTION;  Surgeon: Shelly Bombard, MD;  Location: [facility];  Service: Obstetrics;  Laterality: N/A;   MOUTH SURGERY      Family Psychiatric History:  Mother - Major depressive disorder  Family History:  Family History  Problem Relation Age of Onset   Cancer Mother    Other Neg Hx    Alcohol abuse Neg Hx    Arthritis Neg Hx    Asthma Neg Hx    Birth defects Neg Hx    COPD Neg Hx    Depression Neg Hx    Drug abuse Neg Hx    Diabetes Neg Hx    Early death Neg Hx    Hearing loss Neg Hx    Heart disease Neg Hx    Hyperlipidemia Neg Hx    Hypertension Neg Hx    Kidney disease Neg Hx    Learning disabilities Neg Hx    Mental illness Neg Hx    Mental retardation Neg Hx    Miscarriages / Stillbirths Neg Hx    Stroke Neg Hx    Vision loss Neg Hx     Social History:  Social History   Socioeconomic History   Marital status: Widowed    Spouse name: Not on file   Number of children: Not on file   Years of education: Not on file   Highest education level: Not on file  Occupational History   Not on file  Tobacco Use   Smoking status: Every Day    Packs/day: 0.25    Types: Cigarettes   Smokeless tobacco: Never  Vaping Use   Vaping Use: Never used  Substance and Sexual  Activity   Alcohol use: Yes    Comment: occ   Drug use: No   Sexual activity: Yes    Birth control/protection: None    Comment: Last intercourse at least 1 week ago  Other Topics Concern   Not on file  Social History Narrative   Not on file   Social Determinants of Health   Financial Resource Strain: Low Risk  (03/17/2021)   Overall Financial Resource Strain (CARDIA)    Difficulty of Paying Living Expenses:  Not hard at all  Food Insecurity: No Food Insecurity (03/17/2021)   Hunger Vital Sign    Worried About Running Out of Food in the Last Year: Never true    Ran Out of Food in the Last Year: Never true  Transportation Needs: No Transportation Needs (03/17/2021)   PRAPARE - Hydrologist (Medical): No    Lack of Transportation (Non-Medical): No  Physical Activity: Inactive (03/17/2021)   Exercise Vital Sign    Days of Exercise per Week: 0 days    Minutes of Exercise per Session: 0 min  Stress: Stress Concern Present (03/17/2021)   [place]    Feeling of Stress : Rather much  Social Connections: Moderately Isolated (03/17/2021)   Social Connection and Isolation Panel [NHANES]    Frequency of Communication with Friends and Family: More than three times a week    Frequency of Social Gatherings with Friends and Family: Twice a week    Attends Religious Services: More than 4 times per year    Active Member of [employer] or Organizations: No    Attends Archivist Meetings: Never    Marital Status: Widowed    Allergies:  Allergies  Allergen Reactions   Penicillins     Metabolic Disorder Labs: Lab Results  Component Value Date   HGBA1C 5.5 11/20/2021   No results found for: "PROLACTIN" Lab Results  Component Value Date   CHOL 177 01/23/2022   TRIG 95 01/23/2022   HDL 42 01/23/2022   CHOLHDL 4.2 01/23/2022   LDLCALC 117 (H) 01/23/2022   LDLCALC 130 (H) 01/26/2020   Lab  Results  Component Value Date   TSH 0.921 01/23/2022   TSH 0.582 11/20/2021    Therapeutic Level Labs: No results found for: "LITHIUM" No results found for: "VALPROATE" No results found for: "CBMZ"  Current Medications: Current Outpatient Medications  Medication Sig Dispense Refill   hydrOXYzine (ATARAX) 25 MG tablet Take 1 tablet (25 mg total) by mouth 3 (three) times daily as needed. 90 tablet 1   ARIPiprazole (ABILIFY) 5 MG tablet Take 1 tablet (5 mg total) by mouth daily. 30 tablet 2   clonazePAM (KLONOPIN) 0.5 MG tablet Take 1 tablet (0.5 mg total) by mouth 2 (two) times daily as needed for anxiety. 60 tablet 1   cyclobenzaprine (FLEXERIL) 10 MG tablet TAKE 1 TABLET BY MOUTH THREE TIMES A DAY AS NEEDED FOR MUSCLE SPASM 30 tablet 0   lidocaine (LIDODERM) 5 % Place 1 patch onto the skin daily. Remove & Discard patch within 12 hours or as directed by MD 30 patch 1   meloxicam (MOBIC) 15 MG tablet Take 1 tablet (15 mg total) by mouth daily. 30 tablet 1   pantoprazole (PROTONIX) 40 MG tablet Take 1 tablet (40 mg total) by mouth daily. 90 tablet 0   PARoxetine (PAXIL) 40 MG tablet Take 1 tablet (40 mg total) by mouth daily. 30 tablet 2   VENTOLIN HFA 108 (90 Base) MCG/ACT inhaler INHALE 1-2 PUFFS BY MOUTH EVERY 6 HOURS AS NEEDED FOR WHEEZE OR SHORTNESS OF BREATH 18 each 1   Vitamin D, Ergocalciferol, (DRISDOL) 1.25 MG (50000 UNIT) CAPS capsule Take 1 capsule (50,000 Units total) by mouth every 7 (seven) days. 12 capsule 0   No current facility-administered medications for this visit.     Musculoskeletal: Strength & Muscle Tone: Unable to assess due to telemedicine visit Gait & Station: Unable to assess due  to telemedicine visit Patient leans: N/A  Psychiatric Specialty Exam: Review of Systems  Psychiatric/Behavioral:  Negative for decreased concentration, dysphoric mood, hallucinations, self-injury, sleep disturbance and suicidal ideas. The patient is not nervous/anxious and is not  hyperactive.     There were no vitals taken for this visit.There is no height or weight on file to calculate BMI.  General Appearance: Casual  Eye Contact: Fair  Speech:  Clear and Coherent and Normal Rate  Volume:  Normal  Mood:  Anxious and Euthymic  Affect:  Appropriate and Congruent  Thought Process:  Coherent and Descriptions of Associations: Intact  Orientation:  Full (Time, Place, and Person)  Thought Content: WDL   Suicidal Thoughts:  No  Homicidal Thoughts:  No  Memory:  Immediate;   Good Recent;   Good Remote;   Good  Judgement:  Good  Insight:  Good  Psychomotor Activity:  Normal  Concentration:  Concentration: Good and Attention Span: Good  Recall:  Good  Fund of Knowledge: Good  Language: Good  Akathisia:  No  Handed:  Right  AIMS (if indicated): not done  Assets:  Communication Skills Desire for Improvement Housing Social Support  ADL's:  Intact  Cognition: WNL  Sleep:  Good   Screenings: GAD-7    Flowsheet Row Video Visit from 04/03/2022 in [facility] Video Visit from 01/31/2022 in [facility] Video Visit from 11/28/2021 in [facility] Video Visit from 09/27/2021 in [facility] Video Visit from 07/26/2021 in [facility]  Total GAD-7 Score '7 15 16 12 13      '$ PHQ2-9    [place] Office Visit from 05/18/2022 in Primary Care at [facility] Video Visit from 04/03/2022 in [facility] Video Visit from 01/31/2022 in [facility] Office Visit from 01/23/2022 in Primary Care at [facility] Video Visit from 11/28/2021 in [facility]  PHQ-2 Total Score 0 0 0 0 0  PHQ-9 Total Score -- -- -- 2 --      Flowsheet Row ED from 04/04/2022 in [facility]  ED from 04/03/2022 in [facility]   Video Visit from 01/31/2022 in [place] No Risk No Risk No Risk        Assessment and Plan:   Alicia [name] is a 41 year old female with a past psychiatric history significant for generalized anxiety disorder and bipolar depression who presents to [facility] via virtual video visit for follow-up and medication management.  She has been off Paxil and Abilify for more than 1 month and Klonopin for 11 days.  Discussed dependence, resistance, and abuse potential of benzodiazepines.  Will start her on hydroxyzine for anxiety and restart Abilify for mood stabilization, racing thoughts and irritability.  Will consider starting antidepressant at next visit.   Collaboration of Care: Collaboration of Care: Dr Sande Rives  Patient/Guardian was advised Release of Information must be obtained prior to any record release in order to collaborate their care with an outside provider. Patient/Guardian was advised if they have not already done so to contact the registration department to sign all necessary forms in order for [place] to release information regarding their care.   Consent: Patient/Guardian gives verbal consent for treatment and assignment of benefits for services provided during this visit. Patient/Guardian expressed  understanding and agreed to proceed.   1. Generalized anxiety disorder  - -Start hydroxyzine 25 mg 3 times daily as needed for anxiety.  30-day prescription with 2 refill sent to patient's pharmacy  2. Bipolar depression (HCC)  --Restart ARIPiprazole (ABILIFY) 5 MG tablet; Take 1 tablet (5 mg total) by mouth daily.  Dispense: 30 tablet; Refill: 2  Patient to follow up in 2 months Provider spent a total of 9 minutes with the patient/reviewing patient's chart  Armando Reichert, MD 08/03/2022, 1:19 PM

## 2022-08-13 ENCOUNTER — Encounter (HOSPITAL_COMMUNITY): Payer: Self-pay | Admitting: Psychiatry

## 2022-08-13 ENCOUNTER — Other Ambulatory Visit: Payer: Self-pay | Admitting: Family Medicine

## 2022-08-15 ENCOUNTER — Encounter: Payer: Self-pay | Admitting: Family Medicine

## 2022-08-15 ENCOUNTER — Ambulatory Visit (INDEPENDENT_AMBULATORY_CARE_PROVIDER_SITE_OTHER): Payer: 59 | Admitting: Family Medicine

## 2022-08-15 VITALS — BP 107/72 | HR 85 | Temp 98.1°F | Resp 16 | Wt 233.4 lb

## 2022-08-15 DIAGNOSIS — Z23 Encounter for immunization: Secondary | ICD-10-CM | POA: Diagnosis not present

## 2022-08-15 DIAGNOSIS — G8929 Other chronic pain: Secondary | ICD-10-CM | POA: Diagnosis not present

## 2022-08-15 DIAGNOSIS — R109 Unspecified abdominal pain: Secondary | ICD-10-CM | POA: Diagnosis not present

## 2022-08-15 NOTE — Progress Notes (Signed)
Established Patient Office Visit  Subjective    Patient ID: Alicia [name], female    DOB: [birth year]  Age: 41 y.o. MRN: [id number]  CC:  Chief Complaint  Patient presents with   Follow-up    HPI Alicia [name] presents with complaint of left sided flank pain for several months that is worsening. She has had a history of uterine fibroids and has not followed up on that. She denies GU or GI sx.    Outpatient Encounter Medications as of 08/15/2022  Medication Sig   ARIPiprazole (ABILIFY) 5 MG tablet Take 1 tablet (5 mg total) by mouth daily.   clonazePAM (KLONOPIN) 0.5 MG tablet Take 1 tablet (0.5 mg total) by mouth 2 (two) times daily as needed for anxiety.   cyclobenzaprine (FLEXERIL) 10 MG tablet TAKE 1 TABLET BY MOUTH THREE TIMES A DAY AS NEEDED FOR MUSCLE SPASM   hydrOXYzine (ATARAX) 25 MG tablet Take 1 tablet (25 mg total) by mouth 3 (three) times daily as needed.   lidocaine (LIDODERM) 5 % Place 1 patch onto the skin daily. Remove & Discard patch within 12 hours or as directed by MD   meloxicam (MOBIC) 15 MG tablet Take 1 tablet (15 mg total) by mouth daily.   pantoprazole (PROTONIX) 40 MG tablet TAKE 1 TABLET BY MOUTH EVERY DAY   PARoxetine (PAXIL) 40 MG tablet Take 1 tablet (40 mg total) by mouth daily.   VENTOLIN HFA 108 (90 Base) MCG/ACT inhaler INHALE 1-2 PUFFS BY MOUTH EVERY 6 HOURS AS NEEDED FOR WHEEZE OR SHORTNESS OF BREATH   Vitamin D, Ergocalciferol, (DRISDOL) 1.25 MG (50000 UNIT) CAPS capsule Take 1 capsule (50,000 Units total) by mouth every 7 (seven) days.   No facility-administered encounter medications on file as of 08/15/2022.    Past Medical History:  Diagnosis Date   Anxiety    Arthritis    Phreesia 03/08/2020   Bladder infection    Complication of anesthesia    Epidural did not work w/2nd preg. labor   Depression    No med. currently, Stopped with + UPT   Depression 06/27/2012   Gonorrhea    Hemorrhoids 06/27/2012   Osteoarthritis 06/27/2012    Osteoporosis    Phreesia 03/08/2020   Panic disorder 06/27/2012   Sleep apnea    Phreesia 03/08/2020    Past Surgical History:  Procedure Laterality Date   BREAST MASS EXCISION     Benign   BREAST SURGERY N/A    Phreesia 03/08/2020   CESAREAN SECTION     X 1 1st preg., then VBAC   CESAREAN SECTION N/A 01/27/2013   Procedure: CESAREAN SECTION;  Surgeon: Shelly Bombard, MD;  Location: [facility];  Service: Obstetrics;  Laterality: N/A;   MOUTH SURGERY      Family History  Problem Relation Age of Onset   Cancer Mother    Other Neg Hx    Alcohol abuse Neg Hx    Arthritis Neg Hx    Asthma Neg Hx    Birth defects Neg Hx    COPD Neg Hx    Depression Neg Hx    Drug abuse Neg Hx    Diabetes Neg Hx    Early death Neg Hx    Hearing loss Neg Hx    Heart disease Neg Hx    Hyperlipidemia Neg Hx    Hypertension Neg Hx    Kidney disease Neg Hx    Learning disabilities Neg Hx    Mental illness Neg  Hx    Mental retardation Neg Hx    Miscarriages / Stillbirths Neg Hx    Stroke Neg Hx    Vision loss Neg Hx     Social History   Socioeconomic History   Marital status: Widowed    Spouse name: Not on file   Number of children: Not on file   Years of education: Not on file   Highest education level: Not on file  Occupational History   Not on file  Tobacco Use   Smoking status: Every Day    Packs/day: 0.25    Types: Cigarettes   Smokeless tobacco: Never  Vaping Use   Vaping Use: Never used  Substance and Sexual Activity   Alcohol use: Yes    Comment: occ   Drug use: No   Sexual activity: Yes    Birth control/protection: None    Comment: Last intercourse at least 1 week ago  Other Topics Concern   Not on file  Social History Narrative   Not on file   Social Determinants of Health   Financial Resource Strain: Low Risk  (03/17/2021)   Overall Financial Resource Strain (CARDIA)    Difficulty of Paying Living Expenses: Not hard at all  Food Insecurity: No Food  Insecurity (03/17/2021)   Hunger Vital Sign    Worried About Running Out of Food in the Last Year: Never true    [place] in the Last Year: Never true  Transportation Needs: No Transportation Needs (03/17/2021)   PRAPARE - Hydrologist (Medical): No    Lack of Transportation (Non-Medical): No  Physical Activity: Inactive (03/17/2021)   Exercise Vital Sign    Days of Exercise per Week: 0 days    Minutes of Exercise per Session: 0 min  Stress: Stress Concern Present (03/17/2021)   [place]    Feeling of Stress : Rather much  Social Connections: Moderately Isolated (03/17/2021)   Social Connection and Isolation Panel [NHANES]    Frequency of Communication with Friends and Family: More than three times a week    Frequency of Social Gatherings with Friends and Family: Twice a week    Attends Religious Services: More than 4 times per year    Active Member of [employer] or Organizations: No    Attends Archivist Meetings: Never    Marital Status: Widowed  Intimate Partner Violence: Not At Risk (03/17/2021)   Humiliation, Afraid, Rape, and Kick questionnaire    Fear of Current or Ex-Partner: No    Emotionally Abused: No    Physically Abused: No    Sexually Abused: No    Review of Systems  Constitutional:  Negative for chills and fever.  Genitourinary:  Positive for flank pain. Negative for frequency, hematuria and urgency.  All other systems reviewed and are negative.       Objective    BP 107/72   Pulse 85   Temp 98.1 F (36.7 C) (Oral)   Resp 16   Wt 233 lb 6.4 oz (105.9 kg)   LMP  (LMP Unknown)   SpO2 97%   BMI 45.58 kg/m   Physical Exam Vitals and nursing note reviewed.  Constitutional:      General: She is not in acute distress. Cardiovascular:     Rate and Rhythm: Normal rate and regular rhythm.  Pulmonary:     Effort: Pulmonary effort is normal.     Breath  sounds: Normal breath sounds.  Abdominal:     Palpations: Abdomen is soft.     Tenderness: There is abdominal tenderness in the right lower quadrant. There is right CVA tenderness.  Neurological:     General: No focal deficit present.     Mental Status: She is alert and oriented to person, place, and time.         Assessment & Plan:   1. Flank pain, chronic Referral for U/S for further eval/mgt - US Abdomen Complete; Future  2. Need for immunization against influenza  - Flu Vaccine QUAD 64moIM (Fluarix, Fluzone & Alfiuria Quad PF)    Return if symptoms worsen or fail to improve.   WBecky Sax MD

## 2022-08-16 ENCOUNTER — Other Ambulatory Visit: Payer: Self-pay | Admitting: Family Medicine

## 2022-08-16 DIAGNOSIS — G8929 Other chronic pain: Secondary | ICD-10-CM

## 2022-08-17 ENCOUNTER — Other Ambulatory Visit: Payer: Self-pay | Admitting: Family Medicine

## 2022-08-17 DIAGNOSIS — G8929 Other chronic pain: Secondary | ICD-10-CM

## 2022-08-17 NOTE — Telephone Encounter (Signed)
Requested medication (s) are due for refill today: yes  Requested medication (s) are on the active medication list: yes   Last refill:  06/16/22 #30  Future visit scheduled: no  Notes to clinic:  Please review for refill. Refill not delegated per protocol.     Requested Prescriptions  Pending Prescriptions Disp Refills   cyclobenzaprine (FLEXERIL) 10 MG tablet [Pharmacy Med Name: CYCLOBENZAPRINE 10 MG TABLET] 30 tablet 0    Sig: TAKE 1 TABLET BY MOUTH THREE TIMES A DAY AS NEEDED FOR MUSCLE SPASM     Not Delegated - Analgesics:  Muscle Relaxants Failed - 08/16/2022  8:31 PM      Failed - This refill cannot be delegated      Passed - Valid encounter within last 6 months    Recent Outpatient Visits           2 days ago Flank pain, chronic   Primary [facility] at [facility], MD   3 months ago Chronic pain of right knee   Primary [facility] at [facility], MD   6 months ago Annual physical exam   Primary Care at [facility], MD   1 year ago Hypotension, unspecified hypotension type   Primary Care at [facility], MD   2 years ago Encounter to discuss test results   Primary Care at [facility], Carroll Sage, FNP

## 2022-08-29 ENCOUNTER — Telehealth (HOSPITAL_COMMUNITY): Payer: Self-pay | Admitting: *Deleted

## 2022-08-29 NOTE — Telephone Encounter (Signed)
[facility] is requesting a 90 day prescription request for Hydroxyzine '25mg'$  tab. Message sent to MD

## 2022-08-30 NOTE — Telephone Encounter (Signed)
Patient was last seen by Armando Reichert, MD on 08/03/2022. Provider scheduled patient to be seen 2 months out but there is no scheduled date. Requesting patient to be scheduled with Armando Reichert, MD 2 months out from last appointment date.

## 2022-09-03 ENCOUNTER — Other Ambulatory Visit: Payer: Self-pay | Admitting: Family Medicine

## 2022-09-25 ENCOUNTER — Other Ambulatory Visit: Payer: Self-pay | Admitting: Family Medicine

## 2022-09-25 ENCOUNTER — Ambulatory Visit: Payer: Medicaid Other

## 2022-09-25 ENCOUNTER — Ambulatory Visit
Admission: RE | Admit: 2022-09-25 | Discharge: 2022-09-25 | Disposition: A | Discharge status: Home / Self Care | Payer: 59 | Source: Ambulatory Visit | Attending: Obstetrics and Gynecology | Admitting: Obstetrics and Gynecology

## 2022-09-25 ENCOUNTER — Encounter: Payer: Self-pay | Admitting: Family Medicine

## 2022-09-25 DIAGNOSIS — N6310 Unspecified lump in the right breast, unspecified quadrant: Secondary | ICD-10-CM

## 2022-09-25 DIAGNOSIS — R928 Other abnormal and inconclusive findings on diagnostic imaging of breast: Secondary | ICD-10-CM | POA: Diagnosis not present

## 2022-09-25 DIAGNOSIS — G8929 Other chronic pain: Secondary | ICD-10-CM

## 2022-09-25 DIAGNOSIS — N6320 Unspecified lump in the left breast, unspecified quadrant: Secondary | ICD-10-CM

## 2022-09-26 MED ORDER — CYCLOBENZAPRINE HCL 10 MG PO TABS: ORAL_TABLET | ORAL | 0 refills | Status: DC

## 2022-10-04 ENCOUNTER — Telehealth (INDEPENDENT_AMBULATORY_CARE_PROVIDER_SITE_OTHER): Payer: Self-pay | Admitting: Psychiatry

## 2022-10-04 DIAGNOSIS — Z91199 Patient's noncompliance with other medical treatment and regimen due to unspecified reason: Secondary | ICD-10-CM

## 2022-10-04 NOTE — Progress Notes (Signed)
Logged into [facility] app for patient's video visit and waited for 10 minutes but patient didn't show up. Tried calling patient on the phone number provided in the chart but was unsuccessful.  Armando Reichert, MD PGY3 Psychiatry Resident  [facility]

## 2022-10-08 ENCOUNTER — Encounter (HOSPITAL_COMMUNITY): Payer: Self-pay | Admitting: Psychiatry

## 2022-10-24 ENCOUNTER — Other Ambulatory Visit: Payer: Self-pay | Admitting: Family Medicine

## 2022-10-24 DIAGNOSIS — G8929 Other chronic pain: Secondary | ICD-10-CM

## 2022-10-25 NOTE — Telephone Encounter (Signed)
Requested medication (s) are due for refill today -yes  Requested medication (s) are on the active medication list -yes  Future visit scheduled -no  Last refill: 09/26/22 #60  Notes to clinic: non delegated Rx  Requested Prescriptions  Pending Prescriptions Disp Refills   cyclobenzaprine (FLEXERIL) 10 MG tablet [Pharmacy Med Name: CYCLOBENZAPRINE 10 MG TABLET] 60 tablet 0    Sig: TAKE 1 TABLET BY MOUTH THREE TIMES A DAY AS NEEDED FOR MUSCLE SPASM     Not Delegated - Analgesics:  Muscle Relaxants Failed - 10/24/2022  3:56 PM      Failed - This refill cannot be delegated      Passed - Valid encounter within last 6 months    Recent Outpatient Visits           2 months ago Flank pain, chronic   [place] [facility] at [facility], MD   5 months ago Chronic pain of right knee   [place] [facility] at [facility], MD   9 months ago Annual physical exam   [place] at [facility], MD   1 year ago Hypotension, unspecified hypotension type   [place] [facility] at [facility], MD   2 years ago Encounter to discuss test results   [place] at [facility], Carroll Sage, NP                 Requested Prescriptions  Pending Prescriptions Disp Refills   cyclobenzaprine (FLEXERIL) 10 MG tablet [Pharmacy Med Name: CYCLOBENZAPRINE 10 MG TABLET] 60 tablet 0    Sig: TAKE 1 TABLET BY MOUTH THREE TIMES A DAY AS NEEDED FOR MUSCLE SPASM     Not Delegated - Analgesics:  Muscle Relaxants Failed - 10/24/2022  3:56 PM      Failed - This refill cannot be delegated      Passed - Valid encounter within last 6 months    Recent Outpatient Visits           2 months ago Flank pain, chronic   [place] [facility] at [facility], MD   5 months ago Chronic pain of right knee   [place] [facility] at [facility], MD   9 months ago  Annual physical exam   [place] [facility] at [facility], MD   1 year ago Hypotension, unspecified hypotension type    Primary Care at [facility], MD   2 years ago Encounter to discuss test results   [place] at [facility], Carroll Sage, NP

## 2022-11-09 ENCOUNTER — Other Ambulatory Visit: Payer: Self-pay | Admitting: Family Medicine

## 2022-11-09 DIAGNOSIS — K219 Gastro-esophageal reflux disease without esophagitis: Secondary | ICD-10-CM

## 2022-11-13 MED ORDER — PANTOPRAZOLE SODIUM 40 MG PO TBEC: 40.0000 mg | DELAYED_RELEASE_TABLET | Freq: Every day | ORAL | 0 refills | Status: DC

## 2022-11-15 ENCOUNTER — Telehealth: Payer: 59 | Admitting: Physician Assistant

## 2022-11-15 DIAGNOSIS — J302 Other seasonal allergic rhinitis: Secondary | ICD-10-CM | POA: Diagnosis not present

## 2022-11-15 DIAGNOSIS — L700 Acne vulgaris: Secondary | ICD-10-CM

## 2022-11-15 MED ORDER — ALBUTEROL SULFATE HFA 108 (90 BASE) MCG/ACT IN AERS: INHALATION_SPRAY | RESPIRATORY_TRACT | 1 refills | Status: DC

## 2022-11-15 MED ORDER — CLINDAMYCIN PHOS-BENZOYL PEROX 1.2-5 % EX GEL: CUTANEOUS | 0 refills | Status: DC

## 2022-11-15 MED ORDER — FLUTICASONE PROPIONATE 50 MCG/ACT NA SUSP: 2.0000 | Freq: Every day | NASAL | 0 refills | Status: DC

## 2022-11-15 NOTE — Progress Notes (Signed)
I have spent 5 minutes in review of e-visit questionnaire, review and updating patient chart, medical decision making and response to patient.   Rynell Ciotti Cody Chatara Lucente, PA-C    

## 2022-11-15 NOTE — Progress Notes (Signed)
E visit for Allergic Rhinitis We are sorry that you are not feeling well.  Here is how we plan to help!  Based on what you have shared with me it looks like you have Allergic Rhinitis.  Rhinitis is when a reaction occurs that causes nasal congestion, runny nose, sneezing, and itching.  Most types of rhinitis are caused by an inflammation and are associated with symptoms in the eyes ears or throat. There are several types of rhinitis.  The most common are acute rhinitis, which is usually caused by a viral illness, allergic or seasonal rhinitis, and nonallergic or year-round rhinitis.  Nasal allergies occur certain times of the year.  Allergic rhinitis is caused when allergens in the air trigger the release of histamine in the body.  Histamine causes itching, swelling, and fluid to build up in the fragile linings of the nasal passages, sinuses and eyelids.  An itchy nose and clear discharge are common.  I recommend the following over the counter treatments: You should take a daily dose of antihistamine and Xyzal 5 mg take 1 tablet daily  I also would recommend a nasal spray: Flonase 2 sprays into each nostril once daily -- I have sent in a prescription for this.   You may also benefit from eye drops such as: Visine 1-2 drops each eye twice daily as needed  I have also sent in an albuterol inhaler to use as directed, if needed, to help relax airways.   HOME CARE:  You can use an over-the-counter saline nasal spray as needed Avoid areas where there is heavy dust, mites, or molds Stay indoors on windy days during the pollen season Keep windows closed in home, at least in bedroom; use air conditioner. Use high-efficiency house air filter Keep windows closed in car, turn AC on re-circulate Avoid playing out with dog during pollen season  GET HELP RIGHT AWAY IF:  If your symptoms do not improve within 10 days You become short of breath You develop yellow or green discharge from your nose for  over 3 days You have coughing fits  MAKE SURE YOU:  Understand these instructions Will watch your condition Will get help right away if you are not doing well or get worse  Thank you for choosing an e-visit. Your e-visit answers were reviewed by a board certified advanced clinical practitioner to complete your personal care plan. Depending upon the condition, your plan could have included both over the counter or prescription medications. Please review your pharmacy choice. Be sure that the pharmacy you have chosen is open so that you can pick up your prescription now.  If there is a problem you may message your provider in [place] to have the prescription routed to another pharmacy. Your safety is important to [place]. If you have drug allergies check your prescription carefully.  For the next 24 hours, you can use MyChart to ask questions about today's visit, request a non-urgent call back, or ask for a work or school excuse from your e-visit provider. You will get an email in the next two days asking about your experience. I hope that your e-visit has been valuable and will speed your recovery.

## 2022-11-15 NOTE — Progress Notes (Signed)
E-Visit for Acne  We are sorry that you are experiencing this issue.  Here is how we plan to help!  Based on what you shared with me it looks like you have severe acne.  Acne is a disorder of the hair follicles and oil glands (sebaceous glands). The sebaceous glands secrete oils to keep the skin moist.  When the glands get clogged, it can lead to pimples or cysts.  These cysts may become infected and leave scars. Acne is very common and normally occurs at puberty.  Acne is also inherited.  Your personal care plan consists of the following recommendations:  I recommend that you use a daily cleanser  You might try 2% topical salicylic acid pads or wipes.  Use the pads to daily cleanse your skin.  I have prescribed a topical gel with an antibiotic:  Clindamycin-benzoyl peroxide gel.  This gel should be applied to the affected areas twice a day.  Be sure to read the package insert to understand potential side effects.   Please schedule a follow-up with your PCP for ongoing management.   HOME CARE: Do not squeeze pimples because that can often lead to infections, worse acne, and scars. Use a moisturizer that contains retinoid or fruit acids that may inhibit the development of new acne lesions. Although there is not a clear link that foods can cause acne, doctors do believe that too many sweets predispose you to skin problems.  GET HELP RIGHT AWAY IF: If your acne gets worse or is not better within 10 days. If you become depressed. If you become pregnant, discontinue medications and call your OB/GYN.  MAKE SURE YOU: Understand these instructions. Will watch your condition. Will get help right away if you are not doing well or get worse.  Thank you for choosing an e-visit.  Your e-visit answers were reviewed by a board certified advanced clinical practitioner to complete your personal care plan. Depending upon the condition, your plan could have included both over the counter or  prescription medications.  Please review your pharmacy choice. Make sure the pharmacy is open so you can pick up prescription now. If there is a problem, you may contact your provider through [employer] and have the prescription routed to another pharmacy.  Your safety is important to [place]. If you have drug allergies check your prescription carefully.   For the next 24 hours you can use MyChart to ask questions about today's visit, request a non-urgent call back, or ask for a work or school excuse. You will get an email in the next two days asking about your experience. I hope that your e-visit has been valuable and will speed your recovery.

## 2022-11-15 NOTE — Progress Notes (Signed)
I have spent 5 minutes in review of e-visit questionnaire, review and updating patient chart, medical decision making and response to patient.   Zimir Kittleson Cody Harless Molinari, PA-C    

## 2022-11-24 ENCOUNTER — Other Ambulatory Visit: Payer: Self-pay | Admitting: Family Medicine

## 2022-11-24 DIAGNOSIS — G8929 Other chronic pain: Secondary | ICD-10-CM

## 2022-11-28 ENCOUNTER — Ambulatory Visit: Payer: 59 | Admitting: Family Medicine

## 2022-12-01 ENCOUNTER — Telehealth: Payer: 59 | Admitting: Nurse Practitioner

## 2022-12-01 DIAGNOSIS — N898 Other specified noninflammatory disorders of vagina: Secondary | ICD-10-CM

## 2022-12-01 NOTE — Progress Notes (Signed)
Based on what you shared with me it looks like you have vaginal discharge,that should be evaluated in a face to face office visit. You need to have a face to face visit to have discharge evaluated for proper treatment. Based on your questionnaire answers I cannot properly diagnose and treat you.   NOTE: There will be NO CHARGE for this eVisit   If you are having a true medical emergency please call 911.      For an urgent face to face visit,  has six urgent care centers for your convenience:     [facility] at [facility] Directions [phone] [address] [place], [place] [address]    [facility]) Get Driving Directions [phone] [address] [place], [place] [address]  [facility]) Get Driving Directions [phone] [address] [place],  [place]  [address]  [facility] at [facility] Get Driving Directions [phone] [address] [place], [place] [address]   [facility] at [facility] Get Driving Directions  [phone] [address] [place], [place] [address]   [facility] Health Urgent Care at [facility] Directions [phone] [address] [place], [place] [address]  Your MyChart E-visit questionnaire answers were reviewed by a board certified advanced clinical practitioner to complete your personal care plan based on your specific symptoms.  Thank you for using e-Visits.

## 2022-12-06 ENCOUNTER — Ambulatory Visit
Admission: EM | Admit: 2022-12-06 | Discharge: 2022-12-06 | Disposition: A | Discharge status: Home / Self Care | Payer: 59 | Attending: Nurse Practitioner | Admitting: Nurse Practitioner

## 2022-12-06 DIAGNOSIS — N898 Other specified noninflammatory disorders of vagina: Secondary | ICD-10-CM | POA: Diagnosis not present

## 2022-12-06 DIAGNOSIS — Z113 Encounter for screening for infections with a predominantly sexual mode of transmission: Secondary | ICD-10-CM | POA: Insufficient documentation

## 2022-12-06 LAB — POCT URINE PREGNANCY: Preg Test, Ur: NEGATIVE

## 2022-12-06 MED ORDER — METRONIDAZOLE 500 MG PO TABS: 500.0000 mg | ORAL_TABLET | Freq: Two times a day (BID) | ORAL | 0 refills | Status: DC

## 2022-12-06 MED ORDER — FLUCONAZOLE 150 MG PO TABS: 150.0000 mg | ORAL_TABLET | ORAL | 0 refills | Status: AC

## 2022-12-06 NOTE — Discharge Instructions (Addendum)
Testing for HIV, syphilis, bacteria, yeast, gonorrhea, chlamydia and trichomonas is pending. You should not have any sexual activity until you receive the results of the tests. You will only be notified for positive results. You may go online to MyChart and review your results. Practice safe sex practices by wearing a condom every time you have sex. Remember that people who have STIs may not experience any symptoms. However, even without symptoms, these infections can be spread from person to person and require treatment. STIs can be treated, and many STIs can be cured. However, some STIs cannot be cured and will affect you for the rest of your life. It's important to be checked regularly for STIs.

## 2022-12-06 NOTE — ED Triage Notes (Signed)
Pt states vaginal discharge and itching for the past 2 days. 

## 2022-12-06 NOTE — ED Provider Notes (Signed)
[facility]    CSN: [id number] Arrival date & time: 12/06/22  4098      History   Chief Complaint Chief Complaint  Patient presents with   Vaginal Discharge    HPI Alicia [name] is a 42 y.o. female.   Subjective:   [name] is a 42 y.o. female who presents for evaluation of vaginal discharge.  The discharge is clear/yellow and odorless. Symptoms have been present for 2 days. She also complains of vaginal itching and irritation.  She denies any dysuria, urinary frequency, genital sores, low back pain, nausea, vomiting, diarrhea, fevers or chills.  Last menstrual period was about a month ago.  She is not on birth control and is sexually active with 1 female partner over the past 3 months.  Patient denies any new exposures or changes in her hygiene products.  Patient desires complete STD testing.  The following portions of the patient's history were reviewed and updated as appropriate: allergies, current medications, past family history, past medical history, past social history, past surgical history, and problem list.        Past Medical History:  Diagnosis Date   Anxiety    Arthritis    Phreesia 03/08/2020   Bladder infection    Complication of anesthesia    Epidural did not work w/2nd preg. labor   Depression    No med. 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   Class 3 severe obesity due to excess calories without serious comorbidity with body mass index (BMI) of 40.0 to 44.9 in adult 04/19/2021   Vitamin D deficiency 01/04/2021   Abnormal liver enzymes 01/04/2021   OSA (obstructive sleep apnea) 06/21/2020    Generalized anxiety disorder 04/26/2020   Bipolar depression 04/26/2020   GERD without esophagitis 01/22/2013    Past Surgical History:  Procedure Laterality Date   BREAST MASS EXCISION     Benign   BREAST SURGERY N/A    Phreesia 03/08/2020   CESAREAN SECTION     X 1 1st preg., then VBAC   CESAREAN SECTION N/A 01/27/2013   Procedure: CESAREAN SECTION;  Surgeon: Brock Bad, MD;  Location: [facility];  Service: Obstetrics;  Laterality: N/A;   MOUTH SURGERY      OB History     Gravida  4   Para  3   Term  2   Preterm  1   AB      Living  3      SAB      IAB      Ectopic      Multiple      Live Births  1            Home Medications    Prior to Admission medications   Medication Sig Start Date End Date Taking? Authorizing Provider  fluconazole (DIFLUCAN) 150 MG tablet Take 1 tablet (150 mg total) by mouth every 3 (three) days for 2 doses. 12/06/22 12/10/22 Yes Lurline Idol, FNP  metroNIDAZOLE (FLAGYL) 500 MG tablet Take 1 tablet (500 mg total) by mouth 2 (two) times daily. 12/06/22  Yes Lurline Idol,  FNP  albuterol (VENTOLIN HFA) 108 (90 Base) MCG/ACT inhaler INHALE 1-2 PUFFS BY MOUTH EVERY 6 HOURS AS NEEDED FOR WHEEZE OR SHORTNESS OF BREATH 11/15/22   Waldon Merl, PA-C  ARIPiprazole (ABILIFY) 5 MG tablet Take 1 tablet (5 mg total) by mouth daily. 08/03/22   Karsten Ro, MD  Clindamycin-Benzoyl Per, Refr, gel Apply topically twice daily. 11/15/22   Waldon Merl, PA-C  clonazePAM (KLONOPIN) 0.5 MG tablet Take 1 tablet (0.5 mg total) by mouth 2 (two) times daily as needed for anxiety. 04/03/22   Nwoko, Tommas Olp, PA  cyclobenzaprine (FLEXERIL) 10 MG tablet TAKE 1 TABLET BY MOUTH THREE TIMES A DAY AS NEEDED FOR MUSCLE SPASM 11/29/22   Georganna Skeans, MD  fluticasone [facility]) 50 MCG/ACT nasal spray Place 2 sprays into both nostrils daily. 11/15/22   Waldon Merl, PA-C  hydrOXYzine (ATARAX) 25 MG tablet Take 1 tablet (25 mg total) by mouth 3  (three) times daily as needed. 08/03/22   Karsten Ro, MD  lidocaine (LIDODERM) 5 % Place 1 patch onto the skin daily. Remove & Discard patch within 12 hours or as directed by MD 05/18/22   Hoy Register, MD  meloxicam (MOBIC) 15 MG tablet Take 1 tablet (15 mg total) by mouth daily. 06/21/22   Madelyn Brunner, DO  pantoprazole (PROTONIX) 40 MG tablet Take 1 tablet (40 mg total) by mouth daily. 11/13/22   Georganna Skeans, MD  PARoxetine (PAXIL) 40 MG tablet Take 1 tablet (40 mg total) by mouth daily. 04/03/22   Nwoko, Tommas Olp, PA  Vitamin D, Ergocalciferol, (DRISDOL) 1.25 MG (50000 UNIT) CAPS capsule Take 1 capsule (50,000 Units total) by mouth every 7 (seven) days. 01/26/22   Georganna Skeans, MD    Family History Family History  Problem Relation Age of Onset   Cancer Mother    Other Neg Hx    Alcohol abuse Neg Hx    Arthritis Neg Hx    Asthma Neg Hx    Birth defects Neg Hx    COPD Neg Hx    Depression Neg Hx    Drug abuse Neg Hx    Diabetes Neg Hx    Early death Neg Hx    Hearing loss Neg Hx    Heart disease Neg Hx    Hyperlipidemia Neg Hx    Hypertension Neg Hx    Kidney disease Neg Hx    Learning disabilities Neg Hx    Mental illness Neg Hx    Mental retardation Neg Hx    Miscarriages / Stillbirths Neg Hx    Stroke Neg Hx    Vision loss Neg Hx     Social History Social History   Tobacco Use   Smoking status: Every Day    Packs/day: .25    Types: Cigarettes   Smokeless tobacco: Never  Vaping Use   Vaping Use: Never used  Substance Use Topics   Alcohol use: Yes    Comment: occ   Drug use: No     Allergies   Penicillins   Review of Systems Review of Systems  Constitutional:  Negative for fever.  Gastrointestinal:  Negative for nausea and vomiting.  Genitourinary:  Positive for vaginal discharge. Negative for dysuria.  All other systems reviewed and are negative.    Physical Exam Triage Vital Signs ED Triage Vitals  Enc Vitals Group     BP 12/06/22 1043  113/70     Pulse Rate 12/06/22 1043 100     Resp 12/06/22  1043 16     Temp 12/06/22 1043 98.1 F (36.7 C)     Temp Source 12/06/22 1043 Oral     SpO2 12/06/22 1043 96 %     Weight --      Height --      Head Circumference --      Peak Flow --      Pain Score 12/06/22 1045 0     Pain Loc --      Pain Edu? --      Excl. in GC? --    No data found.  Updated Vital Signs BP 113/70 (BP Location: Left Arm)   Pulse 100   Temp 98.1 F (36.7 C) (Oral)   Resp 16   LMP 11/07/2022 (Approximate)   SpO2 96%   Visual Acuity Right Eye Distance:   Left Eye Distance:   Bilateral Distance:    Right Eye Near:   Left Eye Near:    Bilateral Near:     Physical Exam Vitals reviewed.  Constitutional:      Appearance: Normal appearance.  HENT:     Head: Normocephalic.  Cardiovascular:     Rate and Rhythm: Normal rate.  Pulmonary:     Effort: Pulmonary effort is normal.  Genitourinary:    Comments: Deferred; patient performed self swab for testing  Musculoskeletal:        General: Normal range of motion.     Cervical back: Normal range of motion.  Skin:    General: Skin is warm and dry.  Neurological:     General: No focal deficit present.     Mental Status: She is alert and oriented to person, place, and time.      UC Treatments / Results  Labs (all labs ordered are listed, but only abnormal results are displayed) Labs Reviewed  HIV ANTIBODY (ROUTINE TESTING W REFLEX)  RPR  POCT URINE PREGNANCY  CERVICOVAGINAL ANCILLARY ONLY    EKG   Radiology No results found.  Procedures Procedures (including critical care time)  Medications Ordered in UC Medications - No data to display  Initial Impression / Assessment and Plan / UC Course  I have reviewed the triage vital signs and the nursing notes.  Pertinent labs & imaging results that were available during my care of the patient were reviewed by me and considered in my medical decision making (see chart for  details).    42 year old female presenting with vaginal discharge, vaginal itching and vaginal irritation for the past 2 days.  No dysuria, urinary frequency, genital sores or fevers.  Patient is afebrile.  Nontoxic.  Vaginal exam deferred.  Patient performed self swab for testing.  Testing for bacteria, yeast, gonorrhea, chlamydia, HIV and syphilis pending.  Patient advised to abstain from sexual activity until results are obtained and adequate treatment is completed for the patient and her partner.  Patient will be empirically started on Flagyl and Diflucan today.   Today's evaluation has revealed no signs of a dangerous process. Discussed diagnosis with patient and/or guardian. Patient and/or guardian aware of their diagnosis, possible red flag symptoms to watch out for and need for close follow up. Patient and/or guardian understands verbal and written discharge instructions. Patient and/or guardian comfortable with plan and disposition.  Patient and/or guardian has a clear mental status at this time, good insight into illness (after discussion and teaching) and has clear judgment to make decisions regarding their care  Documentation was completed with the aid of voice recognition software. Transcription  may contain typographical errors. Final Clinical Impressions(s) / UC Diagnoses   Final diagnoses:  Vaginal discharge  Screening examination for STD (sexually transmitted disease)     Discharge Instructions      Testing for HIV, syphilis, bacteria, yeast, gonorrhea, chlamydia and trichomonas is pending. You should not have any sexual activity until you receive the results of the tests. You will only be notified for positive results. You may go online to MyChart and review your results. Practice safe sex practices by wearing a condom every time you have sex. Remember that people who have STIs may not experience any symptoms. However, even without symptoms, these infections can be spread from  person to person and require treatment. STIs can be treated, and many STIs can be cured. However, some STIs cannot be cured and will affect you for the rest of your life. It's important to be checked regularly for STIs.      ED Prescriptions     Medication Sig Dispense Auth. Provider   metroNIDAZOLE (FLAGYL) 500 MG tablet Take 1 tablet (500 mg total) by mouth 2 (two) times daily. 14 tablet Lurline Idol, FNP   fluconazole (DIFLUCAN) 150 MG tablet Take 1 tablet (150 mg total) by mouth every 3 (three) days for 2 doses. 2 tablet Lurline Idol, FNP      PDMP not reviewed this encounter.   Lurline Idol, [place] 12/06/22 1129

## 2022-12-07 LAB — CERVICOVAGINAL ANCILLARY ONLY
Bacterial Vaginitis (gardnerella): NEGATIVE
Candida Glabrata: NEGATIVE
Candida Vaginitis: POSITIVE — AB
Chlamydia: NEGATIVE
Comment: NEGATIVE
Comment: NEGATIVE
Comment: NEGATIVE
Comment: NEGATIVE
Comment: NEGATIVE
Comment: NORMAL
Neisseria Gonorrhea: NEGATIVE
Trichomonas: POSITIVE — AB

## 2022-12-07 LAB — HIV ANTIBODY (ROUTINE TESTING W REFLEX): HIV Screen 4th Generation wRfx: NONREACTIVE

## 2022-12-07 LAB — RPR: RPR Ser Ql: NONREACTIVE

## 2022-12-12 ENCOUNTER — Other Ambulatory Visit: Payer: Self-pay | Admitting: Family Medicine

## 2022-12-12 ENCOUNTER — Ambulatory Visit (INDEPENDENT_AMBULATORY_CARE_PROVIDER_SITE_OTHER): Payer: 59 | Admitting: Obstetrics

## 2022-12-12 ENCOUNTER — Encounter: Payer: Self-pay | Admitting: Obstetrics

## 2022-12-12 VITALS — BP 108/63 | HR 92 | Ht 60.0 in | Wt 241.0 lb

## 2022-12-12 DIAGNOSIS — N946 Dysmenorrhea, unspecified: Secondary | ICD-10-CM | POA: Diagnosis not present

## 2022-12-12 DIAGNOSIS — G8929 Other chronic pain: Secondary | ICD-10-CM

## 2022-12-12 DIAGNOSIS — Z01419 Encounter for gynecological examination (general) (routine) without abnormal findings: Secondary | ICD-10-CM

## 2022-12-12 DIAGNOSIS — F17210 Nicotine dependence, cigarettes, uncomplicated: Secondary | ICD-10-CM | POA: Diagnosis not present

## 2022-12-12 DIAGNOSIS — Z6841 Body Mass Index (BMI) 40.0 and over, adult: Secondary | ICD-10-CM | POA: Diagnosis not present

## 2022-12-12 DIAGNOSIS — Z3009 Encounter for other general counseling and advice on contraception: Secondary | ICD-10-CM

## 2022-12-12 DIAGNOSIS — D251 Intramural leiomyoma of uterus: Secondary | ICD-10-CM | POA: Diagnosis not present

## 2022-12-12 DIAGNOSIS — N939 Abnormal uterine and vaginal bleeding, unspecified: Secondary | ICD-10-CM | POA: Diagnosis not present

## 2022-12-12 MED ORDER — PNV TABS 29-1 29-1 MG PO TABS
1.0000 | ORAL_TABLET | Freq: Every day | ORAL | 11 refills | Status: DC
Start: 2022-12-12 — End: 2024-07-22

## 2022-12-12 MED ORDER — IBUPROFEN 800 MG PO TABS
800.0000 mg | ORAL_TABLET | Freq: Three times a day (TID) | ORAL | 5 refills | Status: AC | PRN
Start: 2022-12-12 — End: ?

## 2022-12-12 NOTE — Progress Notes (Addendum)
42 y.o. GYN presents for AEX/PAP.  Pt was treated for +Trich 12/06/22.  C/o painful periods and blood clots.  Last Mammogram 09/25/22

## 2022-12-12 NOTE — Progress Notes (Signed)
Patient ID: Alicia [name], female   DOB: 1981-03-15, 42 y.o.   MRN: [id number]  Chief Complaint  Patient presents with   Gynecologic Exam    HPI Alicia [name] is a 42 y.o. female.  Complains of heavy, painful periods HPI  Past Medical History:  Diagnosis Date   Anxiety    Arthritis    Phreesia 03/08/2020   Bladder infection    Complication of anesthesia    Epidural did not work w/2nd preg. labor   Depression    No med. currently, Stopped with + UPT   Depression 06/27/2012   Gonorrhea    Hemorrhoids 06/27/2012   Osteoarthritis 06/27/2012   Osteoporosis    Phreesia 03/08/2020   Panic disorder 06/27/2012   Sleep apnea    Phreesia 03/08/2020    Past Surgical History:  Procedure Laterality Date   BREAST MASS EXCISION     Benign   BREAST SURGERY N/A    Phreesia 03/08/2020   CESAREAN SECTION     X 1 1st preg., then VBAC   CESAREAN SECTION N/A 01/27/2013   Procedure: CESAREAN SECTION;  Surgeon: Brock Bad, MD;  Location: [facility];  Service: Obstetrics;  Laterality: N/A;   MOUTH SURGERY      Family History  Problem Relation Age of Onset   Cancer Mother    Other Neg Hx    Alcohol abuse Neg Hx    Arthritis Neg Hx    Asthma Neg Hx    Birth defects Neg Hx    COPD Neg Hx    Depression Neg Hx    Drug abuse Neg Hx    Diabetes Neg Hx    Early death Neg Hx    Hearing loss Neg Hx    Heart disease Neg Hx    Hyperlipidemia Neg Hx    Hypertension Neg Hx    Kidney disease Neg Hx    Learning disabilities Neg Hx    Mental illness Neg Hx    Mental retardation Neg Hx    Miscarriages / Stillbirths Neg Hx    Stroke Neg Hx    Vision loss Neg Hx     Social History Social History   Tobacco Use   Smoking status: Every Day    Packs/day: .25    Types: Cigarettes   Smokeless tobacco: Never  Vaping Use   Vaping Use: Never used  Substance Use Topics   Alcohol use: Yes    Comment: occ   Drug use: No    Allergies  Allergen Reactions   Penicillins      Current Outpatient Medications  Medication Sig Dispense Refill   ibuprofen (ADVIL) 800 MG tablet Take 1 tablet (800 mg total) by mouth every 8 (eight) hours as needed. 30 tablet 5   Prenatal Vit-Iron Carbonyl-FA (PNV TABS 29-1) 29-1 MG TABS Take 1 tablet by mouth daily before breakfast. 30 tablet 11   albuterol (VENTOLIN HFA) 108 (90 Base) MCG/ACT inhaler INHALE 1-2 PUFFS BY MOUTH EVERY 6 HOURS AS NEEDED FOR WHEEZE OR SHORTNESS OF BREATH 18 each 1   ARIPiprazole (ABILIFY) 5 MG tablet Take 1 tablet (5 mg total) by mouth daily. 30 tablet 2   Clindamycin-Benzoyl Per, Refr, gel Apply topically twice daily. 45 g 0   clonazePAM (KLONOPIN) 0.5 MG tablet Take 1 tablet (0.5 mg total) by mouth 2 (two) times daily as needed for anxiety. 60 tablet 1   cyclobenzaprine (FLEXERIL) 10 MG tablet TAKE 1 TABLET BY MOUTH THREE TIMES A  DAY AS NEEDED FOR MUSCLE SPASM 60 tablet 0   fluticasone (FLONASE) 50 MCG/ACT nasal spray Place 2 sprays into both nostrils daily. 16 g 0   hydrOXYzine (ATARAX) 25 MG tablet Take 1 tablet (25 mg total) by mouth 3 (three) times daily as needed. 90 tablet 1   lidocaine (LIDODERM) 5 % Place 1 patch onto the skin daily. Remove & Discard patch within 12 hours or as directed by MD (Patient not taking: Reported on 12/12/2022) 30 patch 1   meloxicam (MOBIC) 15 MG tablet Take 1 tablet (15 mg total) by mouth daily. 30 tablet 1   metroNIDAZOLE (FLAGYL) 500 MG tablet Take 1 tablet (500 mg total) by mouth 2 (two) times daily. 14 tablet 0   pantoprazole (PROTONIX) 40 MG tablet Take 1 tablet (40 mg total) by mouth daily. 90 tablet 0   PARoxetine (PAXIL) 40 MG tablet Take 1 tablet (40 mg total) by mouth daily. 30 tablet 2   Vitamin D, Ergocalciferol, (DRISDOL) 1.25 MG (50000 UNIT) CAPS capsule Take 1 capsule (50,000 Units total) by mouth every 7 (seven) days. 12 capsule 0   No current facility-administered medications for this visit.    Review of Systems Review of Systems Constitutional:  negative for fatigue and weight loss Respiratory: negative for cough and wheezing Cardiovascular: negative for chest pain, fatigue and palpitations Gastrointestinal: negative for abdominal pain and change in bowel habits Genitourinary:positive for heavy and painful periods Integument/breast: negative for nipple discharge Musculoskeletal:negative for myalgias Neurological: negative for gait problems and tremors Behavioral/Psych: negative for abusive relationship, depression Endocrine: negative for temperature intolerance      Blood pressure 108/63, pulse 92, height 5' (1.524 m), weight 241 lb (109.3 kg), last menstrual period 12/09/2022.  Physical Exam Physical Exam General:   Alert and no distress  Skin:   no rash or abnormalities  Lungs:   clear to auscultation bilaterally  Heart:   regular rate and rhythm, S1, S2 normal, no murmur, click, rub or gallop  Breasts:   Not examined  Abdomen:  normal findings: no organomegaly, soft, non-tender and no hernia  Pelvis:  External genitalia: normal general appearance Urinary system: urethral meatus normal and bladder without fullness, nontender Vaginal: normal without tenderness, induration or masses Cervix: normal appearance Adnexa: normal bimanual exam Uterus: anteverted and non-tender, normal size    I have spent a total of 20 minutes of face-to-face time, excluding clinical staff time, reviewing notes and preparing to see patient, ordering tests and/or medications, and counseling the patient.   Data Reviewed Labs Ultrasound  Assessment     1. Abnormal uterine bleeding (AUB) Rx: - CBC - Ferritin - Comprehensive metabolic panel - US PELVIC COMPLETE WITH TRANSVAGINAL; Future - Prenatal Vit-Iron Carbonyl-FA (PNV TABS 29-1) 29-1 MG TABS; Take 1 tablet by mouth daily before breakfast.  Dispense: 30 tablet; Refill: 11  2. Intramural leiomyoma of uterus  3. Severe dysmenorrhea Rx: - ibuprofen (ADVIL) 800 MG tablet; Take 1 tablet  (800 mg total) by mouth every 8 (eight) hours as needed.  Dispense: 30 tablet; Refill: 5  4. Encounter for counseling regarding contraception - desires to use condoms  5. Class 3 severe obesity without serious comorbidity with body mass index (BMI) of 45.0 to 49.9 in adult, unspecified obesity type Rx: - Hemoglobin A1c - TSH  6. Tobacco dependence due to cigarettes - cessation recommended    Plan   Follow up in 2 weeks  Orders Placed This Encounter  Procedures   US PELVIC COMPLETE WITH  TRANSVAGINAL    Standing Status:   Future    Standing Expiration Date:   12/12/2023    Order Specific Question:   Reason for Exam (SYMPTOM  OR DIAGNOSIS REQUIRED)    Answer:   AUB  .Follow up fibroid.    Order Specific Question:   Preferred imaging location?    Answer:   [facility]   CBC   Hemoglobin A1c   Ferritin   TSH   Comprehensive metabolic panel   Meds ordered this encounter  Medications   ibuprofen (ADVIL) 800 MG tablet    Sig: Take 1 tablet (800 mg total) by mouth every 8 (eight) hours as needed.    Dispense:  30 tablet    Refill:  5   Prenatal Vit-Iron Carbonyl-FA (PNV TABS 29-1) 29-1 MG TABS    Sig: Take 1 tablet by mouth daily before breakfast.    Dispense:  30 tablet    Refill:  11      Brock Bad, MD 12/12/2022 11:23 AM

## 2022-12-13 ENCOUNTER — Other Ambulatory Visit: Payer: Self-pay | Admitting: Emergency Medicine

## 2022-12-13 DIAGNOSIS — N939 Abnormal uterine and vaginal bleeding, unspecified: Secondary | ICD-10-CM

## 2022-12-13 LAB — TSH: TSH: 1.32 u[IU]/mL (ref 0.450–4.500)

## 2022-12-13 LAB — COMPREHENSIVE METABOLIC PANEL WITH GFR
ALT: 11 [IU]/L (ref 0–32)
AST: 17 [IU]/L (ref 0–40)
Albumin/Globulin Ratio: 1.2 (ref 1.2–2.2)
Albumin: 4 g/dL (ref 3.9–4.9)
Alkaline Phosphatase: 86 [IU]/L (ref 44–121)
BUN/Creatinine Ratio: 9 (ref 9–23)
BUN: 6 mg/dL (ref 6–24)
Bilirubin Total: 0.6 mg/dL (ref 0.0–1.2)
CO2: 23 mmol/L (ref 20–29)
Calcium: 9.2 mg/dL (ref 8.7–10.2)
Chloride: 102 mmol/L (ref 96–106)
Creatinine, Ser: 0.65 mg/dL (ref 0.57–1.00)
Globulin, Total: 3.4 g/dL (ref 1.5–4.5)
Glucose: 80 mg/dL (ref 70–99)
Potassium: 3.9 mmol/L (ref 3.5–5.2)
Sodium: 140 mmol/L (ref 134–144)
Total Protein: 7.4 g/dL (ref 6.0–8.5)
eGFR: 113 mL/min/{1.73_m2}

## 2022-12-13 LAB — HEMOGLOBIN A1C
Est. average glucose Bld gHb Est-mCnc: 126 mg/dL
Hgb A1c MFr Bld: 6 % — ABNORMAL HIGH (ref 4.8–5.6)

## 2022-12-13 LAB — CBC
Hematocrit: 39.5 % (ref 34.0–46.6)
Hemoglobin: 12.5 g/dL (ref 11.1–15.9)
MCH: 26.5 pg — ABNORMAL LOW (ref 26.6–33.0)
MCHC: 31.6 g/dL (ref 31.5–35.7)
MCV: 84 fL (ref 79–97)
Platelets: 296 10*3/uL (ref 150–450)
RBC: 4.72 x10E6/uL (ref 3.77–5.28)
RDW: 12.4 % (ref 11.7–15.4)
WBC: 6.6 10*3/uL (ref 3.4–10.8)

## 2022-12-13 LAB — FERRITIN: Ferritin: 23 ng/mL (ref 15–150)

## 2022-12-13 NOTE — Progress Notes (Signed)
[place] reordered for pt to reschedule.  Pt informed of results.

## 2022-12-14 ENCOUNTER — Ambulatory Visit (HOSPITAL_BASED_OUTPATIENT_CLINIC_OR_DEPARTMENT_OTHER): Payer: 59

## 2022-12-18 DIAGNOSIS — F410 Panic disorder [episodic paroxysmal anxiety] without agoraphobia: Secondary | ICD-10-CM | POA: Diagnosis not present

## 2022-12-19 DIAGNOSIS — F411 Generalized anxiety disorder: Secondary | ICD-10-CM | POA: Diagnosis not present

## 2022-12-19 DIAGNOSIS — F319 Bipolar disorder, unspecified: Secondary | ICD-10-CM | POA: Diagnosis not present

## 2022-12-20 ENCOUNTER — Telehealth: Payer: 59 | Admitting: Physician Assistant

## 2022-12-20 DIAGNOSIS — Z9109 Other allergy status, other than to drugs and biological substances: Secondary | ICD-10-CM

## 2022-12-20 NOTE — Progress Notes (Signed)
E visit for Allergic Rhinitis We are sorry that you are not feeling well.  Here is how we plan to help!  Based on what you have shared with me it looks like you have Allergic Rhinitis.  Rhinitis is when a reaction occurs that causes nasal congestion, runny nose, sneezing, and itching.  Most types of rhinitis are caused by an inflammation and are associated with symptoms in the eyes ears or throat. There are several types of rhinitis.  The most common are acute rhinitis, which is usually caused by a viral illness, allergic or seasonal rhinitis, and nonallergic or year-round rhinitis.  Nasal allergies occur certain times of the year.  Allergic rhinitis is caused when allergens in the air trigger the release of histamine in the body.  Histamine causes itching, swelling, and fluid to build up in the fragile linings of the nasal passages, sinuses and eyelids.  An itchy nose and clear discharge are common.  I recommend the following over the counter treatments: You should take a daily dose of antihistamine and Xyzal 5 mg take 1 tablet daily  I also would recommend a nasal spray: Flonase 2 sprays into each nostril once daily  You may also benefit from eye drops such as: Visine 1-2 drops each eye twice daily as needed  HOME CARE:  You can use an over-the-counter saline nasal spray as needed Avoid areas where there is heavy dust, mites, or molds Stay indoors on windy days during the pollen season Keep windows closed in home, at least in bedroom; use air conditioner. Use high-efficiency house air filter Keep windows closed in car, turn AC on re-circulate Avoid playing out with dog during pollen season  GET HELP RIGHT AWAY IF:  If your symptoms do not improve within 10 days You become short of breath You develop yellow or green discharge from your nose for over 3 days You have coughing fits  MAKE SURE YOU:  Understand these instructions Will watch your condition Will get help right away if  you are not doing well or get worse  Thank you for choosing an e-visit. Your e-visit answers were reviewed by a board certified advanced clinical practitioner to complete your personal care plan. Depending upon the condition, your plan could have included both over the counter or prescription medications. Please review your pharmacy choice. Be sure that the pharmacy you have chosen is open so that you can pick up your prescription now.  If there is a problem you may message your provider in MyChart to have the prescription routed to another pharmacy. Your safety is important to us. If you have drug allergies check your prescription carefully.  For the next 24 hours, you can use MyChart to ask questions about today's visit, request a non-urgent call back, or ask for a work or school excuse from your e-visit provider. You will get an email in the next two days asking about your experience. I hope that your e-visit has been valuable and will speed your recovery.        

## 2022-12-20 NOTE — Progress Notes (Signed)
I have spent 5 minutes in review of e-visit questionnaire, review and updating patient chart, medical decision making and response to patient.   Shahrukh Pasch Cody Nelma Phagan, PA-C    

## 2022-12-25 ENCOUNTER — Ambulatory Visit (HOSPITAL_BASED_OUTPATIENT_CLINIC_OR_DEPARTMENT_OTHER)
Admission: RE | Admit: 2022-12-25 | Discharge: 2022-12-25 | Disposition: A | Discharge status: Home / Self Care | Payer: 59 | Source: Ambulatory Visit | Attending: Obstetrics | Admitting: Obstetrics

## 2022-12-25 DIAGNOSIS — N939 Abnormal uterine and vaginal bleeding, unspecified: Secondary | ICD-10-CM | POA: Diagnosis not present

## 2022-12-29 DIAGNOSIS — F410 Panic disorder [episodic paroxysmal anxiety] without agoraphobia: Secondary | ICD-10-CM | POA: Diagnosis not present

## 2023-01-08 ENCOUNTER — Other Ambulatory Visit: Payer: 59

## 2023-01-09 ENCOUNTER — Ambulatory Visit: Payer: 59 | Admitting: Obstetrics and Gynecology

## 2023-01-11 ENCOUNTER — Ambulatory Visit
Admission: RE | Admit: 2023-01-11 | Discharge: 2023-01-11 | Disposition: A | Discharge status: Home / Self Care | Payer: 59 | Source: Ambulatory Visit | Attending: Obstetrics | Admitting: Obstetrics

## 2023-01-11 ENCOUNTER — Other Ambulatory Visit: Payer: Self-pay | Admitting: Family Medicine

## 2023-01-11 DIAGNOSIS — N939 Abnormal uterine and vaginal bleeding, unspecified: Secondary | ICD-10-CM

## 2023-01-11 DIAGNOSIS — D259 Leiomyoma of uterus, unspecified: Secondary | ICD-10-CM | POA: Diagnosis not present

## 2023-01-11 DIAGNOSIS — G8929 Other chronic pain: Secondary | ICD-10-CM

## 2023-01-14 DIAGNOSIS — F319 Bipolar disorder, unspecified: Secondary | ICD-10-CM | POA: Diagnosis not present

## 2023-01-14 DIAGNOSIS — F411 Generalized anxiety disorder: Secondary | ICD-10-CM | POA: Diagnosis not present

## 2023-01-20 ENCOUNTER — Other Ambulatory Visit: Payer: Self-pay | Admitting: Family Medicine

## 2023-01-20 DIAGNOSIS — G8929 Other chronic pain: Secondary | ICD-10-CM

## 2023-01-22 NOTE — Telephone Encounter (Signed)
Requested medication (s) are due for refill today: routing for review  Requested medication (s) are on the active medication list: yes  Last refill:  11/29/22  Future visit scheduled: no  Notes to clinic:  Unable to refill per protocol, cannot delegate.      Requested Prescriptions  Pending Prescriptions Disp Refills   cyclobenzaprine (FLEXERIL) 10 MG tablet [Pharmacy Med Name: CYCLOBENZAPRINE 10 MG TABLET] 60 tablet 0    Sig: TAKE 1 TABLET BY MOUTH THREE TIMES A DAY AS NEEDED FOR MUSCLE SPASM     Not Delegated - Analgesics:  Muscle Relaxants Failed - 01/20/2023  3:06 PM      Failed - This refill cannot be delegated      Passed - Valid encounter within last 6 months    Recent Outpatient Visits           5 months ago Flank pain, chronic   [place] [facility], MD   8 months ago Chronic pain of right knee   [place] [facility], MD   12 months ago Annual physical exam   [place] [facility], MD   1 year ago Hypotension, unspecified hypotension type   [place] [facility], MD   2 years ago Encounter to discuss test results   [facility], NP

## 2023-02-03 DIAGNOSIS — Z8249 Family history of ischemic heart disease and other diseases of the circulatory system: Secondary | ICD-10-CM | POA: Diagnosis not present

## 2023-02-03 DIAGNOSIS — M62838 Other muscle spasm: Secondary | ICD-10-CM | POA: Diagnosis not present

## 2023-02-03 DIAGNOSIS — F319 Bipolar disorder, unspecified: Secondary | ICD-10-CM | POA: Diagnosis not present

## 2023-02-03 DIAGNOSIS — Z720 Tobacco use: Secondary | ICD-10-CM | POA: Diagnosis not present

## 2023-02-03 DIAGNOSIS — F411 Generalized anxiety disorder: Secondary | ICD-10-CM | POA: Diagnosis not present

## 2023-02-03 DIAGNOSIS — Z880 Allergy status to penicillin: Secondary | ICD-10-CM | POA: Diagnosis not present

## 2023-02-03 DIAGNOSIS — K219 Gastro-esophageal reflux disease without esophagitis: Secondary | ICD-10-CM | POA: Diagnosis not present

## 2023-02-03 DIAGNOSIS — Z811 Family history of alcohol abuse and dependence: Secondary | ICD-10-CM | POA: Diagnosis not present

## 2023-02-03 DIAGNOSIS — Z833 Family history of diabetes mellitus: Secondary | ICD-10-CM | POA: Diagnosis not present

## 2023-02-03 DIAGNOSIS — M1990 Unspecified osteoarthritis, unspecified site: Secondary | ICD-10-CM | POA: Diagnosis not present

## 2023-02-03 DIAGNOSIS — Z886 Allergy status to analgesic agent status: Secondary | ICD-10-CM | POA: Diagnosis not present

## 2023-02-03 DIAGNOSIS — J302 Other seasonal allergic rhinitis: Secondary | ICD-10-CM | POA: Diagnosis not present

## 2023-02-19 ENCOUNTER — Other Ambulatory Visit: Payer: Self-pay | Admitting: Family Medicine

## 2023-02-19 DIAGNOSIS — K219 Gastro-esophageal reflux disease without esophagitis: Secondary | ICD-10-CM

## 2023-02-19 DIAGNOSIS — G8929 Other chronic pain: Secondary | ICD-10-CM

## 2023-03-01 ENCOUNTER — Telehealth: Payer: 59 | Admitting: Nurse Practitioner

## 2023-03-01 DIAGNOSIS — R12 Heartburn: Secondary | ICD-10-CM

## 2023-03-01 DIAGNOSIS — M545 Low back pain, unspecified: Secondary | ICD-10-CM

## 2023-03-01 MED ORDER — PANTOPRAZOLE SODIUM 40 MG PO TBEC
40.0000 mg | DELAYED_RELEASE_TABLET | Freq: Every day | ORAL | 0 refills | Status: DC
Start: 2023-03-01 — End: 2023-03-18

## 2023-03-01 NOTE — Progress Notes (Signed)
Because of your ongoing heartburn and back pain, I feel your condition warrants further evaluation and I recommend that you be seen in a face to face visit.   NOTE: There will be NO CHARGE for this eVisit   If you are having a true medical emergency please call 911.      For an urgent face to face visit, [place] has eight urgent care centers for your convenience:   NEW!! [facility] at [facility] Get Driving Directions [phone] [address] [place], [address]    [facility] at [facility] Get Driving Directions [phone] [address] [place], [place] [address]   [facility]) Get Driving Directions [phone] [address] [place], [place] [address]  [facility] [place] [facility] [facility]) Get Driving Directions [phone] [address] [place],  [place]  [address]  [facility] Center [facility] - at [employer]  [phone] [id number] [place],  [place] [address]   [facility] Health Urgent Care at [facility] Get Driving Directions [phone] [address] [place], [place] [address]   [facility] Health Urgent Care at [facility] Get Driving Directions  [phone] [address] [place], [place] [address]   [facility] Health Urgent Care at [facility] Directions [phone] [address] [place], [place] [address]  Your MyChart E-visit questionnaire answers were reviewed by a board certified advanced clinical practitioner to complete your personal care plan based on your specific symptoms.  Thank you for using e-Visits.

## 2023-03-01 NOTE — Progress Notes (Signed)
Because of your ongoing heartburn and back pain, I feel your condition warrants further evaluation and I recommend that you be seen in a face to face visit.   NOTE: There will be NO CHARGE for this eVisit   If you are having a true medical emergency please call 911.      For an urgent face to face visit, [place] has eight urgent care centers for your convenience:   NEW!! [facility] [place] Get Driving Directions [phone] [address] [place], [address]    [facility] [place] Get Driving Directions [phone] [address] [place], [place] [address]   [facility]) Get Driving Directions [phone] [address] [place], [place] [address]  [facility]) Get Driving Directions [phone] [address] [place],  [place]  [address]  [facility] ( - at [address] Get Driving Directions  [phone] [address] ,  [place] [address]   [place] [facility] Get Driving Directions [phone] [address] [place], [place] [address]   [place] [facility] Get Driving Directions  [phone] [address] [place], [place] [address]   [place] [facility] [place] Get Driving Directions [phone] [address] [place],  [address]  Your MyChart E-visit questionnaire answers were reviewed by a board certified advanced clinical practitioner to complete your personal care plan based on your specific symptoms.  Thank you for using e-Visits.    

## 2023-03-08 ENCOUNTER — Telehealth: Payer: Self-pay | Admitting: Family Medicine

## 2023-03-08 NOTE — Telephone Encounter (Signed)
Called pt and left vm to call office back to schedule appt requested via MyChart. 

## 2023-03-18 ENCOUNTER — Ambulatory Visit (INDEPENDENT_AMBULATORY_CARE_PROVIDER_SITE_OTHER): Payer: 59 | Admitting: Family Medicine

## 2023-03-18 DIAGNOSIS — K219 Gastro-esophageal reflux disease without esophagitis: Secondary | ICD-10-CM

## 2023-03-18 DIAGNOSIS — F411 Generalized anxiety disorder: Secondary | ICD-10-CM

## 2023-03-18 DIAGNOSIS — G8929 Other chronic pain: Secondary | ICD-10-CM

## 2023-03-18 DIAGNOSIS — M5441 Lumbago with sciatica, right side: Secondary | ICD-10-CM

## 2023-03-18 DIAGNOSIS — M5442 Lumbago with sciatica, left side: Secondary | ICD-10-CM | POA: Diagnosis not present

## 2023-03-18 MED ORDER — HYDROXYZINE HCL 25 MG PO TABS
25.0000 mg | ORAL_TABLET | Freq: Three times a day (TID) | ORAL | 1 refills | Status: DC | PRN
Start: 2023-03-18 — End: 2023-04-11

## 2023-03-18 MED ORDER — CYCLOBENZAPRINE HCL 10 MG PO TABS: ORAL_TABLET | ORAL | 3 refills | Status: DC

## 2023-03-18 MED ORDER — PANTOPRAZOLE SODIUM 40 MG PO TBEC: 40.0000 mg | DELAYED_RELEASE_TABLET | Freq: Every day | ORAL | 0 refills | Status: DC

## 2023-03-18 NOTE — Progress Notes (Unsigned)
Patient is here for their 6 month follow-up Patient has no concerns today Care gaps have been discussed with patient  

## 2023-03-20 ENCOUNTER — Encounter: Payer: Self-pay | Admitting: Family Medicine

## 2023-03-20 NOTE — Progress Notes (Signed)
EstablishedPatient Office Visit  Subjective    Patient ID: Alicia [name], female    DOB: 07-10-1981  Age: 42 y.o. MRN: [id number]  CC: No chief complaint on file.   HPI Alicia [name] presents for follow up of chronic med issues.    Outpatient Encounter Medications as of 03/18/2023  Medication Sig   albuterol (VENTOLIN HFA) 108 (90 Base) MCG/ACT inhaler INHALE 1-2 PUFFS BY MOUTH EVERY 6 HOURS AS NEEDED FOR WHEEZE OR SHORTNESS OF BREATH   ARIPiprazole (ABILIFY) 5 MG tablet Take 1 tablet (5 mg total) by mouth daily.   clonazePAM (KLONOPIN) 0.5 MG tablet Take 1 tablet (0.5 mg total) by mouth 2 (two) times daily as needed for anxiety.   cyclobenzaprine (FLEXERIL) 10 MG tablet TAKE 1 TABLET BY MOUTH THREE TIMES A DAY AS NEEDED FOR MUSCLE SPASM   hydrOXYzine (ATARAX) 25 MG tablet Take 1 tablet (25 mg total) by mouth 3 (three) times daily as needed.   ibuprofen (ADVIL) 800 MG tablet Take 1 tablet (800 mg total) by mouth every 8 (eight) hours as needed.   lidocaine (LIDODERM) 5 % Place 1 patch onto the skin daily. Remove & Discard patch within 12 hours or as directed by MD (Patient not taking: Reported on 12/12/2022)   meloxicam (MOBIC) 15 MG tablet Take 1 tablet (15 mg total) by mouth daily.   pantoprazole (PROTONIX) 40 MG tablet Take 1 tablet (40 mg total) by mouth daily.   PARoxetine (PAXIL) 40 MG tablet Take 1 tablet (40 mg total) by mouth daily.   Prenatal Vit-Iron Carbonyl-FA (PNV TABS 29-1) 29-1 MG TABS Take 1 tablet by mouth daily before breakfast.   Vitamin D, Ergocalciferol, (DRISDOL) 1.25 MG (50000 UNIT) CAPS capsule Take 1 capsule (50,000 Units total) by mouth every 7 (seven) days.   [DISCONTINUED] Clindamycin-Benzoyl Per, Refr, gel Apply topically twice daily.   [DISCONTINUED] cyclobenzaprine (FLEXERIL) 10 MG tablet TAKE 1 TABLET BY MOUTH THREE TIMES A DAY AS NEEDED FOR MUSCLE SPASM   [DISCONTINUED] fluticasone (FLONASE) 50 MCG/ACT nasal spray Place 2 sprays into both nostrils  daily.   [DISCONTINUED] hydrOXYzine (ATARAX) 25 MG tablet Take 1 tablet (25 mg total) by mouth 3 (three) times daily as needed.   [DISCONTINUED] metroNIDAZOLE (FLAGYL) 500 MG tablet Take 1 tablet (500 mg total) by mouth 2 (two) times daily.   [DISCONTINUED] pantoprazole (PROTONIX) 40 MG tablet Take 1 tablet (40 mg total) by mouth daily.   No facility-administered encounter medications on file as of 03/18/2023.    Past Medical History:  Diagnosis Date   Anxiety    Arthritis    Phreesia 03/08/2020   Bladder infection    Complication of anesthesia    Epidural did not work w/2nd preg. labor   Depression    No med. currently, Stopped with + UPT   Depression 06/27/2012   Gonorrhea    Hemorrhoids 06/27/2012   Osteoarthritis 06/27/2012   Osteoporosis    Phreesia 03/08/2020   Panic disorder 06/27/2012   Sleep apnea    Phreesia 03/08/2020    Past Surgical History:  Procedure Laterality Date   BREAST MASS EXCISION     Benign   BREAST SURGERY N/A    Phreesia 03/08/2020   CESAREAN SECTION     X 1 1st preg., then VBAC   CESAREAN SECTION N/A 01/27/2013   Procedure: CESAREAN SECTION;  Surgeon: Brock Bad, MD;  Location: [facility];  Service: Obstetrics;  Laterality: N/A;   MOUTH SURGERY  Family History  Problem Relation Age of Onset   Cancer Mother    Other Neg Hx    Alcohol abuse Neg Hx    Arthritis Neg Hx    Asthma Neg Hx    Birth defects Neg Hx    COPD Neg Hx    Depression Neg Hx    Drug abuse Neg Hx    Diabetes Neg Hx    Early death Neg Hx    Hearing loss Neg Hx    Heart disease Neg Hx    Hyperlipidemia Neg Hx    Hypertension Neg Hx    Kidney disease Neg Hx    Learning disabilities Neg Hx    Mental illness Neg Hx    Mental retardation Neg Hx    Miscarriages / Stillbirths Neg Hx    Stroke Neg Hx    Vision loss Neg Hx     Social History   Socioeconomic History   Marital status: Widowed    Spouse name: Not on file   Number of children: Not on file   Years  of education: Not on file   Highest education level: 12th grade  Occupational History   Not on file  Tobacco Use   Smoking status: Every Day    Current packs/day: 0.25    Types: Cigarettes   Smokeless tobacco: Never  Vaping Use   Vaping status: Never Used  Substance and Sexual Activity   Alcohol use: Yes    Comment: occ   Drug use: No   Sexual activity: Not Currently    Birth control/protection: Condom    Comment: Last intercourse at least 1 week ago  Other Topics Concern   Not on file  Social History Narrative   Not on file   Social Determinants of Health   Financial Resource Strain: Low Risk  (03/18/2023)   Overall Financial Resource Strain (CARDIA)    Difficulty of Paying Living Expenses: Not hard at all  Food Insecurity: No Food Insecurity (03/18/2023)   Hunger Vital Sign    Worried About Running Out of Food in the Last Year: Never true    Ran Out of Food in the Last Year: Never true  Transportation Needs: No Transportation Needs (03/18/2023)   PRAPARE - Administrator, Civil Service (Medical): No    Lack of Transportation (Non-Medical): No  Physical Activity: Unknown (03/18/2023)   Exercise Vital Sign    Days of Exercise per Week: 0 days    Minutes of Exercise per Session: Not on file  Stress: Stress Concern Present (03/18/2023)   [employer] of Occupational Health - Occupational Stress Questionnaire    Feeling of Stress : Very much  Social Connections: Unknown (03/18/2023)   Social Connection and Isolation Panel [NHANES]    Frequency of Communication with Friends and Family: Once a week    Frequency of Social Gatherings with Friends and Family: Once a week    Attends Religious Services: Patient declined    Database administrator or Organizations: No    Attends Engineer, structural: Not on file    Marital Status: Widowed  Intimate Partner Violence: Not At Risk (03/17/2021)   Humiliation, Afraid, Rape, and Kick questionnaire    Fear of  Current or Ex-Partner: No    Emotionally Abused: No    Physically Abused: No    Sexually Abused: No    Review of Systems  All other systems reviewed and are negative.       Objective  BP 111/76   Pulse 84   Temp 98.1 F (36.7 C) (Oral)   Resp 16   Wt 250 lb (113.4 kg)   SpO2 99%   BMI 48.82 kg/m   Physical Exam Vitals and nursing note reviewed.  Constitutional:      General: She is not in acute distress. Cardiovascular:     Rate and Rhythm: Normal rate and regular rhythm.  Pulmonary:     Effort: Pulmonary effort is normal.     Breath sounds: Normal breath sounds.  Abdominal:     Palpations: Abdomen is soft.     Tenderness: There is no abdominal tenderness.  Neurological:     General: No focal deficit present.     Mental Status: She is alert and oriented to person, place, and time.  Psychiatric:        Mood and Affect: Mood normal.        Behavior: Behavior normal.         Assessment & Plan:   1. Generalized anxiety disorder Hydroxyzine refilled.  - hydrOXYzine (ATARAX) 25 MG tablet; Take 1 tablet (25 mg total) by mouth 3 (three) times daily as needed.  Dispense: 90 tablet; Refill: 1  2. GERD without esophagitis Appears stable. Protonix refilled - pantoprazole (PROTONIX) 40 MG tablet; Take 1 tablet (40 mg total) by mouth daily.  Dispense: 90 tablet; Refill: 0  3. Chronic midline low back pain with bilateral sciatica Appears stable. Flexeril refilled.  - cyclobenzaprine (FLEXERIL) 10 MG tablet; TAKE 1 TABLET BY MOUTH THREE TIMES A DAY AS NEEDED FOR MUSCLE SPASM  Dispense: 60 tablet; Refill: 3    No follow-ups on file.   Tommie Raymond, MD

## 2023-04-11 ENCOUNTER — Other Ambulatory Visit: Payer: Self-pay | Admitting: Family Medicine

## 2023-04-11 DIAGNOSIS — F411 Generalized anxiety disorder: Secondary | ICD-10-CM

## 2023-04-19 ENCOUNTER — Encounter: Payer: Self-pay | Admitting: Family Medicine

## 2023-04-26 DIAGNOSIS — F4312 Post-traumatic stress disorder, chronic: Secondary | ICD-10-CM | POA: Diagnosis not present

## 2023-04-26 DIAGNOSIS — F39 Unspecified mood [affective] disorder: Secondary | ICD-10-CM | POA: Diagnosis not present

## 2023-04-26 DIAGNOSIS — F411 Generalized anxiety disorder: Secondary | ICD-10-CM | POA: Diagnosis not present

## 2023-05-15 DIAGNOSIS — F411 Generalized anxiety disorder: Secondary | ICD-10-CM | POA: Diagnosis not present

## 2023-05-15 DIAGNOSIS — F4312 Post-traumatic stress disorder, chronic: Secondary | ICD-10-CM | POA: Diagnosis not present

## 2023-05-15 DIAGNOSIS — F39 Unspecified mood [affective] disorder: Secondary | ICD-10-CM | POA: Diagnosis not present

## 2023-06-11 DIAGNOSIS — F4312 Post-traumatic stress disorder, chronic: Secondary | ICD-10-CM | POA: Diagnosis not present

## 2023-06-11 DIAGNOSIS — F411 Generalized anxiety disorder: Secondary | ICD-10-CM | POA: Diagnosis not present

## 2023-06-11 DIAGNOSIS — F39 Unspecified mood [affective] disorder: Secondary | ICD-10-CM | POA: Diagnosis not present

## 2023-07-09 DIAGNOSIS — F4312 Post-traumatic stress disorder, chronic: Secondary | ICD-10-CM | POA: Diagnosis not present

## 2023-07-09 DIAGNOSIS — F39 Unspecified mood [affective] disorder: Secondary | ICD-10-CM | POA: Diagnosis not present

## 2023-07-09 DIAGNOSIS — F411 Generalized anxiety disorder: Secondary | ICD-10-CM | POA: Diagnosis not present

## 2023-07-17 DIAGNOSIS — F4312 Post-traumatic stress disorder, chronic: Secondary | ICD-10-CM | POA: Diagnosis not present

## 2023-07-17 DIAGNOSIS — F411 Generalized anxiety disorder: Secondary | ICD-10-CM | POA: Diagnosis not present

## 2023-07-17 DIAGNOSIS — F39 Unspecified mood [affective] disorder: Secondary | ICD-10-CM | POA: Diagnosis not present

## 2023-07-17 DIAGNOSIS — F331 Major depressive disorder, recurrent, moderate: Secondary | ICD-10-CM | POA: Diagnosis not present

## 2023-08-06 DIAGNOSIS — F4312 Post-traumatic stress disorder, chronic: Secondary | ICD-10-CM | POA: Diagnosis not present

## 2023-08-06 DIAGNOSIS — F411 Generalized anxiety disorder: Secondary | ICD-10-CM | POA: Diagnosis not present

## 2023-08-06 DIAGNOSIS — F39 Unspecified mood [affective] disorder: Secondary | ICD-10-CM | POA: Diagnosis not present

## 2023-08-08 ENCOUNTER — Other Ambulatory Visit: Payer: Self-pay | Admitting: Family

## 2023-08-08 ENCOUNTER — Other Ambulatory Visit: Payer: Self-pay | Admitting: Emergency Medicine

## 2023-08-08 DIAGNOSIS — K219 Gastro-esophageal reflux disease without esophagitis: Secondary | ICD-10-CM

## 2023-08-08 MED ORDER — PANTOPRAZOLE SODIUM 40 MG PO TBEC
40.0000 mg | DELAYED_RELEASE_TABLET | Freq: Every day | ORAL | 0 refills | Status: DC
Start: 2023-08-08 — End: 2023-11-04

## 2023-08-24 ENCOUNTER — Other Ambulatory Visit: Payer: Self-pay | Admitting: Family Medicine

## 2023-08-24 DIAGNOSIS — G8929 Other chronic pain: Secondary | ICD-10-CM

## 2023-08-26 ENCOUNTER — Telehealth: Payer: Medicaid Other

## 2023-08-26 DIAGNOSIS — M545 Low back pain, unspecified: Secondary | ICD-10-CM

## 2023-08-27 MED ORDER — NAPROXEN 500 MG PO TABS
500.0000 mg | ORAL_TABLET | Freq: Two times a day (BID) | ORAL | 0 refills | Status: DC
Start: 2023-08-27 — End: 2023-11-23

## 2023-08-27 MED ORDER — CYCLOBENZAPRINE HCL 10 MG PO TABS
5.0000 mg | ORAL_TABLET | Freq: Three times a day (TID) | ORAL | 0 refills | Status: DC | PRN
Start: 2023-08-27 — End: 2023-11-23

## 2023-08-27 NOTE — Progress Notes (Signed)

## 2023-10-05 ENCOUNTER — Encounter (INDEPENDENT_AMBULATORY_CARE_PROVIDER_SITE_OTHER): Payer: Self-pay

## 2023-10-05 ENCOUNTER — Telehealth: Payer: Medicaid Other | Admitting: Urgent Care

## 2023-10-05 DIAGNOSIS — N3000 Acute cystitis without hematuria: Secondary | ICD-10-CM | POA: Diagnosis not present

## 2023-10-05 MED ORDER — NITROFURANTOIN MONOHYD MACRO 100 MG PO CAPS: 100.0000 mg | ORAL_CAPSULE | Freq: Two times a day (BID) | ORAL | 0 refills | Status: AC

## 2023-10-05 NOTE — Progress Notes (Signed)
 E-Visit for Urinary Problems  We are sorry that you are not feeling well.  Here is how we plan to help!  Based on what you shared with me it looks like you most likely have a simple urinary tract infection.  A UTI (Urinary Tract Infection) is a bacterial infection of the bladder.  Most cases of urinary tract infections are simple to treat but a key part of your care is to encourage you to drink plenty of fluids and watch your symptoms carefully.  I have prescribed MacroBid  100 mg twice a day for 5 days.  Your symptoms should gradually improve. Call us  if the burning in your urine worsens, you develop worsening fever, back pain or pelvic pain or if your symptoms do not resolve after completing the antibiotic. Please also consider using A&D ointment to any affected or irritated skin surrounding the vagina.  Urinary tract infections can be prevented by drinking plenty of water to keep your body hydrated.  Also be sure when you wipe, wipe from front to back and don't hold it in!  If possible, empty your bladder every 4 hours.  HOME CARE Drink plenty of fluids Compete the full course of the antibiotics even if the symptoms resolve Remember, when you need to go.go. Holding in your urine can increase the likelihood of getting a UTI! GET HELP RIGHT AWAY IF: You cannot urinate You get a high fever Worsening back pain occurs You see blood in your urine You feel sick to your stomach or throw up You feel like you are going to pass out  MAKE SURE YOU  Understand these instructions. Will watch your condition. Will get help right away if you are not doing well or get worse.   Thank you for choosing an e-visit.  Your e-visit answers were reviewed by a board certified advanced clinical practitioner to complete your personal care plan. Depending upon the condition, your plan could have included both over the counter or prescription medications.  Please review your pharmacy choice. Make sure the  pharmacy is open so you can pick up prescription now. If there is a problem, you may contact your provider through [employer] and have the prescription routed to another pharmacy.  Your safety is important to us . If you have drug allergies check your prescription carefully.   For the next 24 hours you can use MyChart to ask questions about today's visit, request a non-urgent call back, or ask for a work or school excuse. You will get an email in the next two days asking about your experience. I hope that your e-visit has been valuable and will speed your recovery.    I have spent 5 minutes in review of e-visit questionnaire, review and updating patient chart, medical decision making and response to patient.   Benton LITTIE Gave, PA

## 2023-11-03 ENCOUNTER — Other Ambulatory Visit: Payer: Self-pay | Admitting: Family Medicine

## 2023-11-03 DIAGNOSIS — K219 Gastro-esophageal reflux disease without esophagitis: Secondary | ICD-10-CM

## 2023-11-22 ENCOUNTER — Telehealth: Admitting: Physician Assistant

## 2023-11-22 DIAGNOSIS — M545 Low back pain, unspecified: Secondary | ICD-10-CM | POA: Diagnosis not present

## 2023-11-23 MED ORDER — NAPROXEN 500 MG PO TABS
500.0000 mg | ORAL_TABLET | Freq: Two times a day (BID) | ORAL | 0 refills | Status: AC
Start: 2023-11-23 — End: ?

## 2023-11-23 MED ORDER — CYCLOBENZAPRINE HCL 10 MG PO TABS
5.0000 mg | ORAL_TABLET | Freq: Three times a day (TID) | ORAL | 0 refills | Status: AC | PRN
Start: 2023-11-23 — End: ?

## 2023-11-23 NOTE — Progress Notes (Signed)
 E-Visit for Back Pain    We are sorry that you are not feeling well.  Here is how we plan to help!   Based on what you have shared with me it looks like you mostly have acute back pain.   Acute back pain is defined as musculoskeletal pain that can resolve in 1-3 weeks with conservative treatment.   I have prescribed Naprosyn 500 mg take one by mouth twice a day non-steroid anti-inflammatory (NSAID) as well as Flexeril 10 mg every eight hours as needed which is a muscle relaxer  Some patients experience stomach irritation or in increased heartburn with anti-inflammatory drugs.  Please keep in mind that muscle relaxer's can cause fatigue and should not be taken while at work or driving.  Back pain is very common.  The pain often gets better over time.  The cause of back pain is usually not dangerous.  Most people can learn to manage their back pain on their own.   Home Care Stay active.  Start with short walks on flat ground if you can.  Try to walk farther each day. Do not sit, drive or stand in one place for more than 30 minutes.  Do not stay in bed. Do not avoid exercise or work.  Activity can help your back heal faster. Be careful when you bend or lift an object.  Bend at your knees, keep the object close to you, and do not twist. Sleep on a firm mattress.  Lie on your side, and bend your knees.  If you lie on your back, put a pillow under your knees. Only take medicines as told by your doctor. Put ice on the injured area. Put ice in a plastic bag Place a towel between your skin and the bag Leave the ice on for 15-20 minutes, 3-4 times a day for the first 2-3 days. 210 After that, you can switch between ice and heat packs. Ask your doctor about back exercises or massage. Avoid feeling anxious or stressed.  Find good ways to deal with stress, such as exercise.   Get Help Right Way If: Your pain does not go away with rest or medicine. Your pain does not go away in 1 week. You have new  problems. You do not feel well. The pain spreads into your legs. You cannot control when you poop (bowel movement) or pee (urinate) You feel sick to your stomach (nauseous) or throw up (vomit) You have belly (abdominal) pain. You feel like you may pass out (faint). If you develop a fever.   Make Sure you: Understand these instructions. Will watch your condition Will get help right away if you are not doing well or get worse.   Your e-visit answers were reviewed by a board certified advanced clinical practitioner to complete your personal care plan.  Depending on the condition, your plan could have included both over the counter or prescription medications.   If there is a problem please reply  once you have received a response from your provider.   Your safety is important to [place].  If you have drug allergies check your prescription carefully.     You can use MyChart to ask questions about today's visit, request a non-urgent call back, or ask for a work or school excuse for 24 hours related to this e-Visit. If it has been greater than 24 hours you will need to follow up with your provider, or enter a new e-Visit to address those concerns.  You will get an e-mail in the next two days asking about your experience.  I hope that your e-visit has been valuable and will speed your recovery. Thank you for using e-visits.  I have spent 5 minutes in review of e-visit questionnaire, review and updating patient chart, medical decision making and response to patient.   Kasandra Knudsen Mayers, PA-C

## 2023-11-29 ENCOUNTER — Other Ambulatory Visit: Payer: Self-pay | Admitting: Emergency Medicine

## 2023-11-29 ENCOUNTER — Other Ambulatory Visit: Payer: Self-pay | Admitting: Family Medicine

## 2023-11-29 DIAGNOSIS — K219 Gastro-esophageal reflux disease without esophagitis: Secondary | ICD-10-CM

## 2023-11-29 MED ORDER — PANTOPRAZOLE SODIUM 40 MG PO TBEC
40.0000 mg | DELAYED_RELEASE_TABLET | Freq: Every day | ORAL | 0 refills | Status: DC
Start: 2023-11-29 — End: 2024-04-02

## 2024-03-13 ENCOUNTER — Telehealth: Admitting: Family Medicine

## 2024-03-13 DIAGNOSIS — M545 Low back pain, unspecified: Secondary | ICD-10-CM

## 2024-03-13 DIAGNOSIS — M549 Dorsalgia, unspecified: Secondary | ICD-10-CM | POA: Diagnosis not present

## 2024-03-14 MED ORDER — BACLOFEN 10 MG PO TABS: 10.0000 mg | ORAL_TABLET | Freq: Three times a day (TID) | ORAL | 0 refills | Status: AC

## 2024-03-14 MED ORDER — NAPROXEN 500 MG PO TABS: 500.0000 mg | ORAL_TABLET | Freq: Two times a day (BID) | ORAL | 0 refills | Status: DC

## 2024-03-14 NOTE — Progress Notes (Signed)
 E-Visit for Back Pain   We are sorry that you are not feeling well.  Here is how we plan to help!  Based on what you have shared with me it looks like you mostly have acute back pain.  Acute back pain is defined as musculoskeletal pain that can resolve in 1-3 weeks with conservative treatment.  I have prescribed Naprosyn 500 mg take one by mouth twice a day non-steroid anti-inflammatory (NSAID) as well as Baclofen 10 mg every eight hours as needed which is a muscle relaxer  Some patients experience stomach irritation or in increased heartburn with anti-inflammatory drugs.  Please keep in mind that muscle relaxer's can cause fatigue and should not be taken while at work or driving.  Back pain is very common.  The pain often gets better over time.  The cause of back pain is usually not dangerous.  Most people can learn to manage their back pain on their own.  Home Care Stay active.  Start with short walks on flat ground if you can.  Try to walk farther each day. Do not sit, drive or stand in one place for more than 30 minutes.  Do not stay in bed. Do not avoid exercise or work.  Activity can help your back heal faster. Be careful when you bend or lift an object.  Bend at your knees, keep the object close to you, and do not twist. Sleep on a firm mattress.  Lie on your side, and bend your knees.  If you lie on your back, put a pillow under your knees. Only take medicines as told by your doctor. Put ice on the injured area. Put ice in a plastic bag Place a towel between your skin and the bag Leave the ice on for 15-20 minutes, 3-4 times a day for the first 2-3 days. 210 After that, you can switch between ice and heat packs. Ask your doctor about back exercises or massage. Avoid feeling anxious or stressed.  Find good ways to deal with stress, such as exercise.  Get Help Right Way If: Your pain does not go away with rest or medicine. Your pain does not go away in 1 week. You have new  problems. You do not feel well. The pain spreads into your legs. You cannot control when you poop (bowel movement) or pee (urinate) You feel sick to your stomach (nauseous) or throw up (vomit) You have belly (abdominal) pain. You feel like you may pass out (faint). If you develop a fever.  Make Sure you: Understand these instructions. Will watch your condition Will get help right away if you are not doing well or get worse.  Your e-visit answers were reviewed by a board certified advanced clinical practitioner to complete your personal care plan.  Depending on the condition, your plan could have included both over the counter or prescription medications.  If there is a problem please reply  once you have received a response from your provider.  Your safety is important to [place].  If you have drug allergies check your prescription carefully.    You can use MyChart to ask questions about today's visit, request a non-urgent call back, or ask for a work or school excuse for 24 hours related to this e-Visit. If it has been greater than 24 hours you will need to follow up with your provider, or enter a new e-Visit to address those concerns.  You will get an e-mail in the next two days asking about  your experience.  I hope that your e-visit has been valuable and will speed your recovery. Thank you for using e-visits.   have provided 5 minutes of non face to face time during this encounter for chart review and documentation.

## 2024-04-02 ENCOUNTER — Telehealth: Admitting: Physician Assistant

## 2024-04-02 DIAGNOSIS — K219 Gastro-esophageal reflux disease without esophagitis: Secondary | ICD-10-CM | POA: Diagnosis not present

## 2024-04-02 MED ORDER — PANTOPRAZOLE SODIUM 40 MG PO TBEC
40.0000 mg | DELAYED_RELEASE_TABLET | Freq: Every day | ORAL | 0 refills | Status: AC
Start: 2024-04-02 — End: ?

## 2024-04-02 NOTE — Progress Notes (Signed)
E-Visit for Heartburn  We are sorry that you are not feeling well.  Here is how we plan to help!  Based on what you shared with me it looks like you most likely have Gastroesophageal Reflux Disease (GERD)  Gastroesophageal reflux disease (GERD) happens when acid from your stomach flows up into the esophagus.  When acid comes in contact with the esophagus, the acid causes sorenss (inflammation) in the esophagus.  Over time, GERD may create small holes (ulcers) in the lining of the esophagus.  I have prescribed Omeprazole 20 mg one by mouth daily until you follow up with a provider.  Your symptoms should improve in the next day or two.  You can use antacids as needed until symptoms resolve.  Call us if your heartburn worsens, you have trouble swallowing, weight loss, spitting up blood or recurrent vomiting.  Home Care: May include lifestyle changes such as weight loss, quitting smoking and alcohol consumption Avoid foods and drinks that make your symptoms worse, such as: Caffeine or alcoholic drinks Chocolate Peppermint or mint flavorings Garlic and onions Spicy foods Citrus fruits, such as oranges, lemons, or limes Tomato-based foods such as sauce, chili, salsa and pizza Fried and fatty foods Avoid lying down for 3 hours prior to your bedtime or prior to taking a nap Eat small, frequent meals instead of a large meals Wear loose-fitting clothing.  Do not wear anything tight around your waist that causes pressure on your stomach. Raise the head of your bed 6 to 8 inches with wood blocks to help you sleep.  Extra pillows will not help.  Seek Help Right Away If: You have pain in your arms, neck, jaw, teeth or back Your pain increases or changes in intensity or duration You develop nausea, vomiting or sweating (diaphoresis) You develop shortness of breath or you faint Your vomit is green, yellow, black or looks like coffee grounds or blood Your stool is red, bloody or black  These  symptoms could be signs of other problems, such as heart disease, gastric bleeding or esophageal bleeding.  Make sure you : Understand these instructions. Will watch your condition. Will get help right away if you are not doing well or get worse.  Your e-visit answers were reviewed by a board certified advanced clinical practitioner to complete your personal care plan.  Depending on the condition, your plan could have included both over the counter or prescription medications.  If there is a problem please reply  once you have received a response from your provider.  Your safety is important to [place].  If you have drug allergies check your prescription carefully.    You can use MyChart to ask questions about todays visit, request a non-urgent call back, or ask for a work or school excuse for 24 hours related to this e-Visit. If it has been greater than 24 hours you will need to follow up with your provider, or enter a new e-Visit to address those concerns.  You will get an e-mail in the next two days asking about your experience.  I hope that your e-visit has been valuable and will speed your recovery. Thank you for using e-visits.

## 2024-04-02 NOTE — Progress Notes (Signed)
 I have spent 5 minutes in review of e-visit questionnaire, review and updating patient chart, medical decision making and response to patient.   Laure Kidney, PA-C

## 2024-04-07 DIAGNOSIS — Z419 Encounter for procedure for purposes other than remedying health state, unspecified: Secondary | ICD-10-CM | POA: Diagnosis not present

## 2024-05-08 DIAGNOSIS — Z419 Encounter for procedure for purposes other than remedying health state, unspecified: Secondary | ICD-10-CM | POA: Diagnosis not present

## 2024-05-09 ENCOUNTER — Telehealth: Admitting: Nurse Practitioner

## 2024-05-09 DIAGNOSIS — N760 Acute vaginitis: Secondary | ICD-10-CM | POA: Diagnosis not present

## 2024-05-09 MED ORDER — FLUCONAZOLE 150 MG PO TABS
150.0000 mg | ORAL_TABLET | ORAL | 0 refills | Status: AC | PRN
Start: 2024-05-09 — End: ?

## 2024-05-09 NOTE — Progress Notes (Signed)
 I have spent 5 minutes in review of e-visit questionnaire, review and updating patient chart, medical decision making and response to patient.   Claiborne Rigg, NP

## 2024-05-09 NOTE — Progress Notes (Signed)

## 2024-05-16 ENCOUNTER — Telehealth: Admitting: Family Medicine

## 2024-05-16 DIAGNOSIS — M545 Low back pain, unspecified: Secondary | ICD-10-CM | POA: Diagnosis not present

## 2024-05-16 MED ORDER — NAPROXEN 500 MG PO TABS: 500.0000 mg | ORAL_TABLET | Freq: Two times a day (BID) | ORAL | 0 refills | Status: AC

## 2024-05-16 MED ORDER — BACLOFEN 10 MG PO TABS: 10.0000 mg | ORAL_TABLET | Freq: Three times a day (TID) | ORAL | 0 refills | Status: AC

## 2024-05-16 NOTE — Progress Notes (Signed)
 E-Visit for Back Pain   We are sorry that you are not feeling well.  Here is how we plan to help!  Based on what you have shared with me it looks like you mostly have acute back pain.  Acute back pain is defined as musculoskeletal pain that can resolve in 1-3 weeks with conservative treatment.  I have prescribed Naprosyn 500 mg take one by mouth twice a day non-steroid anti-inflammatory (NSAID) as well as Baclofen 10 mg every eight hours as needed which is a muscle relaxer  Some patients experience stomach irritation or in increased heartburn with anti-inflammatory drugs.  Please keep in mind that muscle relaxer's can cause fatigue and should not be taken while at work or driving.  Back pain is very common.  The pain often gets better over time.  The cause of back pain is usually not dangerous.  Most people can learn to manage their back pain on their own.  Home Care Stay active.  Start with short walks on flat ground if you can.  Try to walk farther each day. Do not sit, drive or stand in one place for more than 30 minutes.  Do not stay in bed. Do not avoid exercise or work.  Activity can help your back heal faster. Be careful when you bend or lift an object.  Bend at your knees, keep the object close to you, and do not twist. Sleep on a firm mattress.  Lie on your side, and bend your knees.  If you lie on your back, put a pillow under your knees. Only take medicines as told by your doctor. Put ice on the injured area. Put ice in a plastic bag Place a towel between your skin and the bag Leave the ice on for 15-20 minutes, 3-4 times a day for the first 2-3 days. 210 After that, you can switch between ice and heat packs. Ask your doctor about back exercises or massage. Avoid feeling anxious or stressed.  Find good ways to deal with stress, such as exercise.  Get Help Right Way If: Your pain does not go away with rest or medicine. Your pain does not go away in 1 week. You have new  problems. You do not feel well. The pain spreads into your legs. You cannot control when you poop (bowel movement) or pee (urinate) You feel sick to your stomach (nauseous) or throw up (vomit) You have belly (abdominal) pain. You feel like you may pass out (faint). If you develop a fever.  Make Sure you: Understand these instructions. Will watch your condition Will get help right away if you are not doing well or get worse.  Your e-visit answers were reviewed by a board certified advanced clinical practitioner to complete your personal care plan.  Depending on the condition, your plan could have included both over the counter or prescription medications.  If there is a problem please reply  once you have received a response from your provider.  Your safety is important to [place].  If you have drug allergies check your prescription carefully.    You can use MyChart to ask questions about today's visit, request a non-urgent call back, or ask for a work or school excuse for 24 hours related to this e-Visit. If it has been greater than 24 hours you will need to follow up with your provider, or enter a new e-Visit to address those concerns.  You will get an e-mail in the next two days asking about  your experience.  I hope that your e-visit has been valuable and will speed your recovery. Thank you for using e-visits.   have provided 5 minutes of non face to face time during this encounter for chart review and documentation.

## 2024-06-01 ENCOUNTER — Other Ambulatory Visit: Payer: Self-pay

## 2024-06-01 ENCOUNTER — Encounter: Payer: Self-pay | Admitting: Pharmacist

## 2024-06-02 ENCOUNTER — Encounter: Payer: Self-pay | Admitting: Family Medicine

## 2024-06-03 NOTE — Telephone Encounter (Signed)
 scheduled

## 2024-06-29 ENCOUNTER — Ambulatory Visit (INDEPENDENT_AMBULATORY_CARE_PROVIDER_SITE_OTHER): Admitting: Family Medicine

## 2024-06-29 VITALS — BP 112/70 | HR 87 | Ht 60.0 in | Wt 282.2 lb

## 2024-06-29 DIAGNOSIS — Z1231 Encounter for screening mammogram for malignant neoplasm of breast: Secondary | ICD-10-CM

## 2024-06-29 DIAGNOSIS — Z7689 Persons encountering health services in other specified circumstances: Secondary | ICD-10-CM | POA: Diagnosis not present

## 2024-06-29 DIAGNOSIS — Z1329 Encounter for screening for other suspected endocrine disorder: Secondary | ICD-10-CM

## 2024-06-29 DIAGNOSIS — Z136 Encounter for screening for cardiovascular disorders: Secondary | ICD-10-CM | POA: Diagnosis not present

## 2024-06-29 DIAGNOSIS — Z130 Encounter for screening for diseases of the blood and blood-forming organs and certain disorders involving the immune mechanism: Secondary | ICD-10-CM | POA: Diagnosis not present

## 2024-06-29 DIAGNOSIS — Z0000 Encounter for general adult medical examination without abnormal findings: Secondary | ICD-10-CM

## 2024-06-29 DIAGNOSIS — Z13228 Encounter for screening for other metabolic disorders: Secondary | ICD-10-CM

## 2024-06-30 ENCOUNTER — Encounter: Payer: Self-pay | Admitting: Family Medicine

## 2024-06-30 LAB — COMPREHENSIVE METABOLIC PANEL WITH GFR
ALT: 11 IU/L (ref 0–32)
AST: 15 IU/L (ref 0–40)
Albumin: 4.1 g/dL (ref 3.9–4.9)
Alkaline Phosphatase: 83 IU/L (ref 41–116)
BUN/Creatinine Ratio: 13 (ref 9–23)
BUN: 8 mg/dL (ref 6–24)
Bilirubin Total: 0.6 mg/dL (ref 0.0–1.2)
CO2: 24 mmol/L (ref 20–29)
Calcium: 9.6 mg/dL (ref 8.7–10.2)
Chloride: 103 mmol/L (ref 96–106)
Creatinine, Ser: 0.64 mg/dL (ref 0.57–1.00)
Globulin, Total: 3.5 g/dL (ref 1.5–4.5)
Glucose: 105 mg/dL — ABNORMAL HIGH (ref 70–99)
Potassium: 4.2 mmol/L (ref 3.5–5.2)
Sodium: 140 mmol/L (ref 134–144)
Total Protein: 7.6 g/dL (ref 6.0–8.5)
eGFR: 113 mL/min/1.73 (ref >59)

## 2024-06-30 LAB — CBC WITH DIFFERENTIAL/PLATELET
Basophils Absolute: 0 x10E3/uL (ref 0.0–0.2)
Basos: 1 %
EOS (ABSOLUTE): 0.1 x10E3/uL (ref 0.0–0.4)
Eos: 1 %
Hematocrit: 41.2 % (ref 34.0–46.6)
Hemoglobin: 12.3 g/dL (ref 11.1–15.9)
Immature Grans (Abs): 0 x10E3/uL (ref 0.0–0.1)
Immature Granulocytes: 0 %
Lymphocytes Absolute: 2.9 x10E3/uL (ref 0.7–3.1)
Lymphs: 34 %
MCH: 24.4 pg — ABNORMAL LOW (ref 26.6–33.0)
MCHC: 29.9 g/dL — ABNORMAL LOW (ref 31.5–35.7)
MCV: 82 fL (ref 79–97)
Monocytes Absolute: 0.5 x10E3/uL (ref 0.1–0.9)
Monocytes: 6 %
Neutrophils Absolute: 5 x10E3/uL (ref 1.4–7.0)
Neutrophils: 58 %
Platelets: 360 x10E3/uL (ref 150–450)
RBC: 5.05 x10E6/uL (ref 3.77–5.28)
RDW: 13.2 % (ref 11.7–15.4)
WBC: 8.6 x10E3/uL (ref 3.4–10.8)

## 2024-06-30 LAB — LIPID PANEL
Chol/HDL Ratio: 4.5 ratio — ABNORMAL HIGH (ref 0.0–4.4)
Cholesterol, Total: 199 mg/dL (ref 100–199)
HDL: 44 mg/dL (ref >39)
LDL Chol Calc (NIH): 136 mg/dL — ABNORMAL HIGH (ref 0–99)
Triglycerides: 104 mg/dL (ref 0–149)
VLDL Cholesterol Cal: 19 mg/dL (ref 5–40)

## 2024-06-30 LAB — VITAMIN D 25 HYDROXY (VIT D DEFICIENCY, FRACTURES): Vit D, 25-Hydroxy: 17.2 ng/mL — ABNORMAL LOW (ref 30.0–100.0)

## 2024-06-30 LAB — HEMOGLOBIN A1C
Est. average glucose Bld gHb Est-mCnc: 134 mg/dL
Hgb A1c MFr Bld: 6.3 % — ABNORMAL HIGH (ref 4.8–5.6)

## 2024-06-30 NOTE — Progress Notes (Signed)
 New Patient Office Visit  Subjective    Patient ID: Alicia [name], female    DOB: 1981/02/05  Age: 43 y.o. MRN: [id number]  CC:  Chief Complaint  Patient presents with   Annual Exam    HPI Alicia [name] presents to establish care and for routine annual exam. Patient denies acute complaints.    Outpatient Encounter Medications as of 06/29/2024  Medication Sig   albuterol  (VENTOLIN  HFA) 108 (90 Base) MCG/ACT inhaler INHALE 1-2 PUFFS BY MOUTH EVERY 6 HOURS AS NEEDED FOR WHEEZE OR SHORTNESS OF BREATH   ARIPiprazole  (ABILIFY ) 5 MG tablet Take 1 tablet (5 mg total) by mouth daily.   baclofen  (LIORESAL ) 10 MG tablet Take 1 tablet (10 mg total) by mouth 3 (three) times daily.   baclofen  (LIORESAL ) 10 MG tablet Take 1 tablet (10 mg total) by mouth 3 (three) times daily.   clonazePAM  (KLONOPIN ) 0.5 MG tablet Take 1 tablet (0.5 mg total) by mouth 2 (two) times daily as needed for anxiety.   cyclobenzaprine  (FLEXERIL ) 10 MG tablet Take 0.5-1 tablets (5-10 mg total) by mouth 3 (three) times daily as needed.   fluconazole  (DIFLUCAN ) 150 MG tablet Take 1 tablet (150 mg total) by mouth every three (3) days as needed.   hydrOXYzine  (ATARAX ) 25 MG tablet TAKE 1 TABLET BY MOUTH THREE TIMES A DAY AS NEEDED   ibuprofen  (ADVIL ) 800 MG tablet Take 1 tablet (800 mg total) by mouth every 8 (eight) hours as needed.   lidocaine  (LIDODERM ) 5 % Place 1 patch onto the skin daily. Remove & Discard patch within 12 hours or as directed by MD (Patient not taking: Reported on 12/12/2022)   naproxen  (NAPROSYN ) 500 MG tablet Take 1 tablet (500 mg total) by mouth 2 (two) times daily with a meal.   naproxen  (NAPROSYN ) 500 MG tablet Take 1 tablet (500 mg total) by mouth 2 (two) times daily with a meal.   pantoprazole  (PROTONIX ) 40 MG tablet Take 1 tablet (40 mg total) by mouth daily.   PARoxetine  (PAXIL ) 40 MG tablet Take 1 tablet (40 mg total) by mouth daily.   Prenatal Vit-Iron Carbonyl-FA (PNV TABS 29-1) 29-1 MG  TABS Take 1 tablet by mouth daily before breakfast.   Vitamin D , Ergocalciferol , (DRISDOL ) 1.25 MG (50000 UNIT) CAPS capsule Take 1 capsule (50,000 Units total) by mouth every 7 (seven) days.   No facility-administered encounter medications on file as of 06/29/2024.    Past Medical History:  Diagnosis Date   Anxiety    Arthritis    [employer] 03/08/2020   Bladder infection    Complication of anesthesia    Epidural did not work w/2nd preg. labor   Depression    No med. currently, Stopped with + UPT   Depression 06/27/2012   Gonorrhea    Hemorrhoids 06/27/2012   Osteoarthritis 06/27/2012   Osteoporosis    [employer] 03/08/2020   Panic disorder 06/27/2012   Sleep apnea    [employer] 03/08/2020    Past Surgical History:  Procedure Laterality Date   BREAST MASS EXCISION     Benign   BREAST SURGERY N/A    [employer] 03/08/2020   CESAREAN SECTION     X 1 1st preg., then VBAC   CESAREAN SECTION N/A 01/27/2013   Procedure: CESAREAN SECTION;  Surgeon: Carlin DELENA Centers, MD;  Location: [facility];  Service: Obstetrics;  Laterality: N/A;   MOUTH SURGERY      Family History  Problem Relation Age of Onset  Cancer Mother    Other Neg Hx    Alcohol abuse Neg Hx    Arthritis Neg Hx    Asthma Neg Hx    Birth defects Neg Hx    COPD Neg Hx    Depression Neg Hx    Drug abuse Neg Hx    Diabetes Neg Hx    Early death Neg Hx    Hearing loss Neg Hx    Heart disease Neg Hx    Hyperlipidemia Neg Hx    Hypertension Neg Hx    Kidney disease Neg Hx    Learning disabilities Neg Hx    Mental illness Neg Hx    Mental retardation Neg Hx    Miscarriages / Stillbirths Neg Hx    Stroke Neg Hx    Vision loss Neg Hx     Social History   Socioeconomic History   Marital status: Widowed    Spouse name: Not on file   Number of children: Not on file   Years of education: Not on file   Highest education level: 12th grade  Occupational History   Not on file  Tobacco Use   Smoking status: Every Day     Current packs/day: 0.25    Types: Cigarettes   Smokeless tobacco: Never  Vaping Use   Vaping status: Never Used  Substance and Sexual Activity   Alcohol use: Yes    Comment: occ   Drug use: No   Sexual activity: Not Currently    Birth control/protection: Condom    Comment: Last intercourse at least 1 week ago  Other Topics Concern   Not on file  Social History Narrative   Not on file   Social Drivers of Health   Financial Resource Strain: Low Risk  (06/29/2024)   Overall Financial Resource Strain (CARDIA)    Difficulty of Paying Living Expenses: Not hard at all  Food Insecurity: No Food Insecurity (06/29/2024)   Hunger Vital Sign    Worried About Running Out of Food in the Last Year: Never true    Ran Out of Food in the Last Year: Never true  Transportation Needs: No Transportation Needs (06/29/2024)   PRAPARE - Administrator, Civil Service (Medical): No    Lack of Transportation (Non-Medical): No  Physical Activity: Inactive (06/29/2024)   Exercise Vital Sign    Days of Exercise per Week: 0 days    Minutes of Exercise per Session: Not on file  Stress: Stress Concern Present (06/29/2024)   [employer] of Occupational Health - Occupational Stress Questionnaire    Feeling of Stress: Rather much  Social Connections: Socially Isolated (06/29/2024)   Social Connection and Isolation Panel    Frequency of Communication with Friends and Family: Once a week    Frequency of Social Gatherings with Friends and Family: Never    Attends Religious Services: 1 to 4 times per year    Active Member of [employer] or Organizations: No    Attends Banker Meetings: Not on file    Marital Status: Widowed  Intimate Partner Violence: Not At Risk (03/17/2021)   Humiliation, Afraid, Rape, and Kick questionnaire    Fear of Current or Ex-Partner: No    Emotionally Abused: No    Physically Abused: No    Sexually Abused: No    Review of Systems  All other systems reviewed  and are negative.       Objective   BP 112/70   Pulse 87  Ht 5' (1.524 m)   Wt 282 lb 3.2 oz (128 kg)   LMP 06/10/2024   SpO2 98%   BMI 55.11 kg/m   Physical Exam Vitals and nursing note reviewed.  Constitutional:      General: She is not in acute distress.    Appearance: She is obese.  HENT:     Head: Normocephalic and atraumatic.     Right Ear: Tympanic membrane, ear canal and external ear normal.     Left Ear: Tympanic membrane, ear canal and external ear normal.     Nose: Nose normal.     Mouth/Throat:     Mouth: Mucous membranes are moist.     Pharynx: Oropharynx is clear.  Eyes:     Conjunctiva/sclera: Conjunctivae normal.     Pupils: Pupils are equal, round, and reactive to light.  Neck:     Thyroid : No thyromegaly.  Cardiovascular:     Rate and Rhythm: Normal rate and regular rhythm.     Heart sounds: Normal heart sounds. No murmur heard. Pulmonary:     Effort: Pulmonary effort is normal. No respiratory distress.     Breath sounds: Normal breath sounds.  Abdominal:     General: There is no distension.     Palpations: Abdomen is soft. There is no mass.     Tenderness: There is no abdominal tenderness.  Musculoskeletal:        General: Normal range of motion.     Cervical back: Normal range of motion and neck supple.  Skin:    General: Skin is warm and dry.  Neurological:     General: No focal deficit present.     Mental Status: She is alert and oriented to person, place, and time.  Psychiatric:        Mood and Affect: Mood normal.        Behavior: Behavior normal.         Assessment & Plan:   Annual physical exam -     Comprehensive metabolic panel with GFR  Screening for deficiency anemia -     CBC with Differential/Platelet  Encounter for screening for cardiovascular disorders -     Lipid panel  Screening for endocrine/metabolic/immunity disorders -     Hemoglobin A1c -     VITAMIN D  25 Hydroxy (Vit-D Deficiency,  Fractures)  Encounter for screening mammogram for malignant neoplasm of breast -     3D Screening Mammogram, Left and Right; Future     No follow-ups on file.   Tanda Raguel SQUIBB, MD

## 2024-07-01 ENCOUNTER — Encounter: Payer: Self-pay | Admitting: Family Medicine

## 2024-07-01 ENCOUNTER — Other Ambulatory Visit (HOSPITAL_COMMUNITY): Payer: Self-pay

## 2024-07-01 ENCOUNTER — Other Ambulatory Visit: Payer: Self-pay | Admitting: Family Medicine

## 2024-07-01 DIAGNOSIS — J302 Other seasonal allergic rhinitis: Secondary | ICD-10-CM

## 2024-07-01 MED ORDER — ALBUTEROL SULFATE HFA 108 (90 BASE) MCG/ACT IN AERS: INHALATION_SPRAY | RESPIRATORY_TRACT | 1 refills | Status: AC

## 2024-07-17 ENCOUNTER — Other Ambulatory Visit: Payer: Self-pay | Admitting: Family Medicine

## 2024-07-17 ENCOUNTER — Ambulatory Visit: Payer: Self-pay | Admitting: Family Medicine

## 2024-07-17 MED ORDER — VITAMIN D (ERGOCALCIFEROL) 1.25 MG (50000 UNIT) PO CAPS: 50000.0000 [IU] | ORAL_CAPSULE | ORAL | 0 refills | Status: AC

## 2024-07-22 ENCOUNTER — Encounter: Payer: Self-pay | Admitting: Family Medicine

## 2024-07-22 ENCOUNTER — Ambulatory Visit: Admitting: Family Medicine

## 2024-07-22 VITALS — BP 109/74 | HR 94 | Ht 60.0 in | Wt 284.2 lb

## 2024-07-22 DIAGNOSIS — Z6841 Body Mass Index (BMI) 40.0 and over, adult: Secondary | ICD-10-CM

## 2024-07-22 DIAGNOSIS — M5442 Lumbago with sciatica, left side: Secondary | ICD-10-CM | POA: Diagnosis not present

## 2024-07-22 DIAGNOSIS — M5441 Lumbago with sciatica, right side: Secondary | ICD-10-CM | POA: Diagnosis not present

## 2024-07-22 DIAGNOSIS — E66813 Obesity, class 3: Secondary | ICD-10-CM

## 2024-07-22 DIAGNOSIS — Z23 Encounter for immunization: Secondary | ICD-10-CM

## 2024-07-22 DIAGNOSIS — G8929 Other chronic pain: Secondary | ICD-10-CM

## 2024-07-22 DIAGNOSIS — Z1231 Encounter for screening mammogram for malignant neoplasm of breast: Secondary | ICD-10-CM

## 2024-07-22 MED ORDER — TRIAMCINOLONE ACETONIDE 40 MG/ML IJ SUSP
40.0000 mg | Freq: Once | INTRAMUSCULAR | Status: AC
  Administered 2024-07-22: 40 mg via INTRAMUSCULAR

## 2024-07-22 NOTE — Progress Notes (Signed)
 Established Patient Office Visit  Subjective    Patient ID: Alicia [name], female    DOB: Jun 05, 1981  Age: 43 y.o. MRN: [id number]  CC:  Chief Complaint  Patient presents with   Back Pain    HPI Alicia [name] presents for complaint of persistent back pain with sciatica. Patient denies known trauma or injury.   Outpatient Encounter Medications as of 07/22/2024  Medication Sig   albuterol  (VENTOLIN  HFA) 108 (90 Base) MCG/ACT inhaler INHALE 1-2 PUFFS BY MOUTH EVERY 6 HOURS AS NEEDED FOR WHEEZE OR SHORTNESS OF BREATH   ARIPiprazole  (ABILIFY ) 5 MG tablet Take 1 tablet (5 mg total) by mouth daily.   baclofen  (LIORESAL ) 10 MG tablet Take 1 tablet (10 mg total) by mouth 3 (three) times daily.   baclofen  (LIORESAL ) 10 MG tablet Take 1 tablet (10 mg total) by mouth 3 (three) times daily.   cyclobenzaprine  (FLEXERIL ) 10 MG tablet Take 0.5-1 tablets (5-10 mg total) by mouth 3 (three) times daily as needed.   fluconazole  (DIFLUCAN ) 150 MG tablet Take 1 tablet (150 mg total) by mouth every three (3) days as needed.   hydrOXYzine  (ATARAX ) 25 MG tablet TAKE 1 TABLET BY MOUTH THREE TIMES A DAY AS NEEDED   ibuprofen  (ADVIL ) 800 MG tablet Take 1 tablet (800 mg total) by mouth every 8 (eight) hours as needed.   naproxen  (NAPROSYN ) 500 MG tablet Take 1 tablet (500 mg total) by mouth 2 (two) times daily with a meal.   naproxen  (NAPROSYN ) 500 MG tablet Take 1 tablet (500 mg total) by mouth 2 (two) times daily with a meal.   pantoprazole  (PROTONIX ) 40 MG tablet Take 1 tablet (40 mg total) by mouth daily.   PARoxetine  (PAXIL ) 40 MG tablet Take 1 tablet (40 mg total) by mouth daily.   Vitamin D , Ergocalciferol , (DRISDOL ) 1.25 MG (50000 UNIT) CAPS capsule Take 1 capsule (50,000 Units total) by mouth every 7 (seven) days.   clonazePAM  (KLONOPIN ) 0.5 MG tablet Take 1 tablet (0.5 mg total) by mouth 2 (two) times daily as needed for anxiety.   lidocaine  (LIDODERM ) 5 % Place 1 patch onto the skin daily. Remove  & Discard patch within 12 hours or as directed by MD (Patient not taking: Reported on 12/12/2022)   Prenatal Vit-Iron Carbonyl-FA (PNV TABS 29-1) 29-1 MG TABS Take 1 tablet by mouth daily before breakfast.   Facility-Administered Encounter Medications as of 07/22/2024  Medication   triamcinolone  acetonide (KENALOG -40) injection 40 mg    Past Medical History:  Diagnosis Date   Anxiety    Arthritis    [employer] 03/08/2020   Bladder infection    Complication of anesthesia    Epidural did not work w/2nd preg. labor   Depression    No med. currently, Stopped with + UPT   Depression 06/27/2012   Gonorrhea    Hemorrhoids 06/27/2012   Osteoarthritis 06/27/2012   Osteoporosis    [employer] 03/08/2020   Panic disorder 06/27/2012   Sleep apnea    [employer] 03/08/2020    Past Surgical History:  Procedure Laterality Date   BREAST MASS EXCISION     Benign   BREAST SURGERY N/A    [employer] 03/08/2020   CESAREAN SECTION     X 1 1st preg., then VBAC   CESAREAN SECTION N/A 01/27/2013   Procedure: CESAREAN SECTION;  Surgeon: Carlin DELENA Centers, MD;  Location: [facility];  Service: Obstetrics;  Laterality: N/A;   MOUTH SURGERY      Family History  Problem Relation Age of Onset   Cancer Mother    Other Neg Hx    Alcohol abuse Neg Hx    Arthritis Neg Hx    Asthma Neg Hx    Birth defects Neg Hx    COPD Neg Hx    Depression Neg Hx    Drug abuse Neg Hx    Diabetes Neg Hx    Early death Neg Hx    Hearing loss Neg Hx    Heart disease Neg Hx    Hyperlipidemia Neg Hx    Hypertension Neg Hx    Kidney disease Neg Hx    Learning disabilities Neg Hx    Mental illness Neg Hx    Mental retardation Neg Hx    Miscarriages / Stillbirths Neg Hx    Stroke Neg Hx    Vision loss Neg Hx     Social History   Socioeconomic History   Marital status: Widowed    Spouse name: Not on file   Number of children: Not on file   Years of education: Not on file   Highest education level: 12th grade   Occupational History   Not on file  Tobacco Use   Smoking status: Every Day    Current packs/day: 0.25    Types: Cigarettes   Smokeless tobacco: Never  Vaping Use   Vaping status: Never Used  Substance and Sexual Activity   Alcohol use: Yes    Comment: occ   Drug use: No   Sexual activity: Not Currently    Birth control/protection: Condom    Comment: Last intercourse at least 1 week ago  Other Topics Concern   Not on file  Social History Narrative   Not on file   Social Drivers of Health   Financial Resource Strain: Low Risk  (06/29/2024)   Overall Financial Resource Strain (CARDIA)    Difficulty of Paying Living Expenses: Not hard at all  Food Insecurity: No Food Insecurity (06/29/2024)   Hunger Vital Sign    Worried About Running Out of Food in the Last Year: Never true    Ran Out of Food in the Last Year: Never true  Transportation Needs: No Transportation Needs (06/29/2024)   PRAPARE - Administrator, Civil Service (Medical): No    Lack of Transportation (Non-Medical): No  Physical Activity: Inactive (06/29/2024)   Exercise Vital Sign    Days of Exercise per Week: 0 days    Minutes of Exercise per Session: Not on file  Stress: Stress Concern Present (06/29/2024)   [employer] of Occupational Health - Occupational Stress Questionnaire    Feeling of Stress: Rather much  Social Connections: Socially Isolated (06/29/2024)   Social Connection and Isolation Panel    Frequency of Communication with Friends and Family: Once a week    Frequency of Social Gatherings with Friends and Family: Never    Attends Religious Services: 1 to 4 times per year    Active Member of [employer] or Organizations: No    Attends Banker Meetings: Not on file    Marital Status: Widowed  Intimate Partner Violence: Not At Risk (07/22/2024)   Humiliation, Afraid, Rape, and Kick questionnaire    Fear of Current or Ex-Partner: No    Emotionally Abused: No    Physically  Abused: No    Sexually Abused: No    Review of Systems  All other systems reviewed and are negative.       Objective  BP 109/74   Pulse 94   Ht 5' (1.524 m)   Wt 284 lb 3.2 oz (128.9 kg)   LMP 06/10/2024   SpO2 96%   BMI 55.50 kg/m   Physical Exam Vitals and nursing note reviewed.  Constitutional:      General: She is not in acute distress. Cardiovascular:     Rate and Rhythm: Normal rate and regular rhythm.  Pulmonary:     Effort: Pulmonary effort is normal.     Breath sounds: Normal breath sounds.  Abdominal:     Palpations: Abdomen is soft.     Tenderness: There is no abdominal tenderness.  Neurological:     General: No focal deficit present.     Mental Status: She is alert and oriented to person, place, and time.  Psychiatric:        Mood and Affect: Mood normal.        Behavior: Behavior normal.         Assessment & Plan:   Chronic midline low back pain with bilateral sciatica -     Triamcinolone  Acetonide  Encounter for screening mammogram for malignant neoplasm of breast -     3D Screening Mammogram, Left and Right; Future  Class 3 severe obesity due to excess calories with serious comorbidity and body mass index (BMI) of 50.0 to 59.9 in adult [facility])     No follow-ups on file.   Tanda Raguel SQUIBB, MD

## 2024-07-29 ENCOUNTER — Other Ambulatory Visit: Payer: Self-pay | Admitting: Family Medicine

## 2024-07-29 DIAGNOSIS — N63 Unspecified lump in unspecified breast: Secondary | ICD-10-CM

## 2024-08-03 ENCOUNTER — Telehealth: Admitting: Family Medicine

## 2024-08-03 ENCOUNTER — Encounter: Payer: Self-pay | Admitting: Family Medicine

## 2024-08-03 DIAGNOSIS — G8929 Other chronic pain: Secondary | ICD-10-CM

## 2024-08-03 NOTE — Telephone Encounter (Signed)
 Noted

## 2024-08-03 NOTE — Progress Notes (Signed)
 Morning,  You have been seen by our group several times this over the last several months for back pain- you will need to follow up in person since this is not improving and appears chronic at this point.   NOTE: There will be NO CHARGE for this E-Visit   If you are having a true medical emergency, please call 911.

## 2024-08-04 ENCOUNTER — Ambulatory Visit

## 2024-08-10 ENCOUNTER — Inpatient Hospital Stay: Admission: RE | Admit: 2024-08-10 | Discharge: 2024-08-10 | Attending: Family Medicine | Admitting: Family Medicine

## 2024-08-10 ENCOUNTER — Other Ambulatory Visit

## 2024-08-10 DIAGNOSIS — N63 Unspecified lump in unspecified breast: Secondary | ICD-10-CM

## 2024-08-11 ENCOUNTER — Ambulatory Visit: Payer: Self-pay | Admitting: Family Medicine

## 2024-09-02 ENCOUNTER — Ambulatory Visit: Admitting: Orthopedic Surgery

## 2024-09-05 ENCOUNTER — Telehealth

## 2024-09-05 DIAGNOSIS — R197 Diarrhea, unspecified: Secondary | ICD-10-CM

## 2024-09-06 MED ORDER — ONDANSETRON HCL 4 MG PO TABS: 4.0000 mg | ORAL_TABLET | Freq: Three times a day (TID) | ORAL | 0 refills | Status: AC | PRN

## 2024-09-06 NOTE — Progress Notes (Signed)
 We are sorry that you are not feeling well.  Here is how we plan to help!  Based on what you have shared with me it looks like you have Acute Infectious Diarrhea.  Most cases of acute diarrhea are due to infections with virus and bacteria and are self-limited conditions lasting less than 14 days.  For your symptoms you may take Imodium 2 mg tablets that are over the counter at your local pharmacy. Take two tablet now and then one after each loose stool up to 6 a day.  Antibiotics are not needed for most people with diarrhea.   Zofran  4 mg 1 tablet every 8 hours as needed for nausea and vomiting   HOME CARE We recommend changing your diet to help with your symptoms for the next few days. Drink plenty of fluids that contain water salt and sugar. Sports drinks such as Gatorade may help.  You may try broths, soups, bananas, applesauce, soft breads, mashed potatoes or crackers.  You are considered infectious for as long as the diarrhea continues. Hand washing or use of alcohol based hand sanitizers is recommend. It is best to stay out of work or school until your symptoms stop.   GET HELP RIGHT AWAY If you have dark yellow colored urine or do not pass urine frequently you should drink more fluids.   If your symptoms worsen  If you feel like you are going to pass out (faint) You have a new problem  MAKE SURE YOU  Understand these instructions. Will watch your condition. Will get help right away if you are not doing well or get worse.  Your e-visit answers were reviewed by a board certified advanced clinical practitioner to complete your personal care plan.  Depending on the condition, your plan could have included both over the counter or prescription medications.  If there is a problem please reply  once you have received a response from your provider.  Your safety is important to us .  If you have drug allergies check your prescription carefully.    You can use MyChart to ask questions  about today's visit, request a non-urgent call back, or ask for a work or school excuse for 24 hours related to this e-Visit. If it has been greater than 24 hours you will need to follow up with your provider, or enter a new e-Visit to address those concerns.   You will get an e-mail in the next two days asking about your experience.  I hope that your e-visit has been valuable and will speed your recovery. Thank you for using e-visits.   I have spent 5 minutes in review of e-visit questionnaire, review and updating patient chart, medical decision making and response to patient.   Bari Learn, FNP

## 2024-09-08 ENCOUNTER — Other Ambulatory Visit

## 2024-09-16 ENCOUNTER — Ambulatory Visit: Admitting: Orthopedic Surgery

## 2024-09-27 ENCOUNTER — Telehealth: Admitting: Family

## 2024-09-27 ENCOUNTER — Encounter

## 2024-09-27 DIAGNOSIS — R399 Unspecified symptoms and signs involving the genitourinary system: Secondary | ICD-10-CM

## 2024-09-27 MED ORDER — NITROFURANTOIN MONOHYD MACRO 100 MG PO CAPS
100.0000 mg | ORAL_CAPSULE | Freq: Two times a day (BID) | ORAL | 0 refills | Status: AC
  Filled 2024-09-27: qty 10, 5d supply, fill #0

## 2024-09-27 NOTE — Progress Notes (Signed)

## 2024-09-28 ENCOUNTER — Other Ambulatory Visit: Payer: Self-pay

## 2024-09-28 ENCOUNTER — Other Ambulatory Visit (HOSPITAL_COMMUNITY): Payer: Self-pay

## 2024-09-29 ENCOUNTER — Other Ambulatory Visit: Payer: Self-pay

## 2024-10-02 ENCOUNTER — Other Ambulatory Visit: Payer: Self-pay
